# Patient Record
Sex: Male | Born: 1935 | Race: White | Hispanic: No | Marital: Married | Smoking: Never smoker
Health system: Southern US, Community
[De-identification: ages and names within clinical notes are randomized; demographics above are authoritative.]

## PROBLEM LIST (undated history)

## (undated) DIAGNOSIS — I10 Essential (primary) hypertension: Secondary | ICD-10-CM

## (undated) DIAGNOSIS — I2510 Atherosclerotic heart disease of native coronary artery without angina pectoris: Secondary | ICD-10-CM

## (undated) DIAGNOSIS — I739 Peripheral vascular disease, unspecified: Secondary | ICD-10-CM

## (undated) DIAGNOSIS — IMO0002 Reserved for concepts with insufficient information to code with codable children: Secondary | ICD-10-CM

## (undated) DIAGNOSIS — D329 Benign neoplasm of meninges, unspecified: Secondary | ICD-10-CM

## (undated) DIAGNOSIS — K5730 Diverticulosis of large intestine without perforation or abscess without bleeding: Secondary | ICD-10-CM

## (undated) DIAGNOSIS — E785 Hyperlipidemia, unspecified: Secondary | ICD-10-CM

## (undated) DIAGNOSIS — N400 Enlarged prostate without lower urinary tract symptoms: Secondary | ICD-10-CM

## (undated) DIAGNOSIS — H9110 Presbycusis, unspecified ear: Secondary | ICD-10-CM

## (undated) DIAGNOSIS — F0390 Unspecified dementia without behavioral disturbance: Secondary | ICD-10-CM

## (undated) HISTORY — DX: Benign prostatic hyperplasia without lower urinary tract symptoms: N40.0

## (undated) HISTORY — DX: Atherosclerotic heart disease of native coronary artery without angina pectoris: I25.10

## (undated) HISTORY — DX: Hyperlipidemia, unspecified: E78.5

## (undated) HISTORY — PX: BRAIN SURGERY: SHX531

## (undated) HISTORY — DX: Reserved for concepts with insufficient information to code with codable children: IMO0002

## (undated) HISTORY — DX: Essential (primary) hypertension: I10

## (undated) HISTORY — DX: Peripheral vascular disease, unspecified: I73.9

## (undated) HISTORY — DX: Diverticulosis of large intestine without perforation or abscess without bleeding: K57.30

## (undated) HISTORY — DX: Presbycusis, unspecified ear: H91.10

## (undated) HISTORY — DX: Benign neoplasm of meninges, unspecified: D32.9

---

## 1998-09-05 HISTORY — PX: GALLBLADDER SURGERY: SHX652

## 1998-09-05 HISTORY — PX: SHOULDER SURGERY: SHX246

## 2004-06-24 ENCOUNTER — Emergency Department: Payer: Self-pay | Admitting: Emergency Medicine

## 2004-06-24 ENCOUNTER — Other Ambulatory Visit: Payer: Self-pay

## 2004-07-08 ENCOUNTER — Ambulatory Visit: Payer: Self-pay | Admitting: Internal Medicine

## 2004-09-05 DIAGNOSIS — D329 Benign neoplasm of meninges, unspecified: Secondary | ICD-10-CM

## 2004-09-05 HISTORY — DX: Benign neoplasm of meninges, unspecified: D32.9

## 2004-11-08 ENCOUNTER — Emergency Department: Payer: Self-pay | Admitting: General Practice

## 2004-11-08 ENCOUNTER — Other Ambulatory Visit: Payer: Self-pay

## 2005-02-07 ENCOUNTER — Ambulatory Visit: Payer: Self-pay | Admitting: Neurosurgery

## 2005-09-05 DIAGNOSIS — K5730 Diverticulosis of large intestine without perforation or abscess without bleeding: Secondary | ICD-10-CM

## 2005-09-05 HISTORY — DX: Diverticulosis of large intestine without perforation or abscess without bleeding: K57.30

## 2005-10-25 ENCOUNTER — Ambulatory Visit: Payer: Self-pay | Admitting: Neurosurgery

## 2006-08-15 ENCOUNTER — Ambulatory Visit: Payer: Self-pay | Admitting: Gastroenterology

## 2007-06-14 ENCOUNTER — Ambulatory Visit: Payer: Self-pay

## 2007-11-19 ENCOUNTER — Other Ambulatory Visit: Payer: Self-pay

## 2007-11-19 ENCOUNTER — Emergency Department: Payer: Self-pay | Admitting: Emergency Medicine

## 2008-02-05 ENCOUNTER — Other Ambulatory Visit: Payer: Self-pay

## 2008-02-05 ENCOUNTER — Inpatient Hospital Stay: Payer: Self-pay | Admitting: Internal Medicine

## 2008-04-21 ENCOUNTER — Ambulatory Visit: Payer: Self-pay | Admitting: Internal Medicine

## 2008-05-05 ENCOUNTER — Encounter: Payer: Self-pay | Admitting: Internal Medicine

## 2008-05-06 ENCOUNTER — Encounter: Payer: Self-pay | Admitting: Internal Medicine

## 2008-06-05 ENCOUNTER — Encounter: Payer: Self-pay | Admitting: Internal Medicine

## 2008-07-06 ENCOUNTER — Encounter: Payer: Self-pay | Admitting: Internal Medicine

## 2008-07-07 ENCOUNTER — Emergency Department: Payer: Self-pay | Admitting: Emergency Medicine

## 2008-08-05 ENCOUNTER — Encounter: Payer: Self-pay | Admitting: Internal Medicine

## 2008-08-18 ENCOUNTER — Ambulatory Visit: Payer: Self-pay | Admitting: Internal Medicine

## 2008-09-22 ENCOUNTER — Ambulatory Visit: Payer: Self-pay | Admitting: Cardiovascular Disease

## 2008-09-30 ENCOUNTER — Ambulatory Visit: Payer: Self-pay | Admitting: Internal Medicine

## 2009-01-13 ENCOUNTER — Ambulatory Visit: Payer: Self-pay | Admitting: Internal Medicine

## 2010-10-06 ENCOUNTER — Ambulatory Visit: Payer: Self-pay | Admitting: Neurosurgery

## 2010-11-07 ENCOUNTER — Emergency Department: Payer: Self-pay | Admitting: Unknown Physician Specialty

## 2011-05-27 ENCOUNTER — Other Ambulatory Visit: Payer: Self-pay | Admitting: Gastroenterology

## 2011-07-05 ENCOUNTER — Encounter: Payer: Self-pay | Admitting: Internal Medicine

## 2011-07-06 ENCOUNTER — Ambulatory Visit (INDEPENDENT_AMBULATORY_CARE_PROVIDER_SITE_OTHER): Payer: 59 | Admitting: Internal Medicine

## 2011-07-06 ENCOUNTER — Encounter: Payer: Self-pay | Admitting: Internal Medicine

## 2011-07-06 DIAGNOSIS — M7520 Bicipital tendinitis, unspecified shoulder: Secondary | ICD-10-CM

## 2011-07-06 NOTE — Patient Instructions (Signed)
Take the etodolac and 2 tylenol twice daily for pain .  Add the tramadol or the tizanidine at bedtime if needed.    Stop your yardwork and physical activity until your muscle calms down   Agree with every other day miralax for bowels

## 2011-07-07 ENCOUNTER — Other Ambulatory Visit: Payer: Self-pay | Admitting: *Deleted

## 2011-07-07 MED ORDER — TIZANIDINE HCL 2 MG PO TABS: 2.0000 mg | ORAL_TABLET | Freq: Three times a day (TID) | ORAL | Status: DC

## 2011-07-07 MED ORDER — TRAMADOL-ACETAMINOPHEN 37.5-325 MG PO TABS: 1.0000 | ORAL_TABLET | Freq: Four times a day (QID) | ORAL | Status: DC | PRN

## 2011-07-07 MED ORDER — ETODOLAC 400 MG PO TABS: 400.0000 mg | ORAL_TABLET | Freq: Two times a day (BID) | ORAL | Status: DC

## 2011-07-09 ENCOUNTER — Encounter: Payer: Self-pay | Admitting: Internal Medicine

## 2011-07-09 DIAGNOSIS — I6522 Occlusion and stenosis of left carotid artery: Secondary | ICD-10-CM | POA: Insufficient documentation

## 2011-07-09 DIAGNOSIS — N400 Enlarged prostate without lower urinary tract symptoms: Secondary | ICD-10-CM | POA: Insufficient documentation

## 2011-07-09 DIAGNOSIS — M8580 Other specified disorders of bone density and structure, unspecified site: Secondary | ICD-10-CM | POA: Insufficient documentation

## 2011-07-09 DIAGNOSIS — D329 Benign neoplasm of meninges, unspecified: Secondary | ICD-10-CM | POA: Insufficient documentation

## 2011-07-09 DIAGNOSIS — M7520 Bicipital tendinitis, unspecified shoulder: Secondary | ICD-10-CM | POA: Insufficient documentation

## 2011-07-09 DIAGNOSIS — K5730 Diverticulosis of large intestine without perforation or abscess without bleeding: Secondary | ICD-10-CM | POA: Insufficient documentation

## 2011-07-09 NOTE — Progress Notes (Signed)
Subjective:    Patient ID: Derrick Sullivan, male    DOB: February 07, 1924, 77 y.o.   MRN: [id number]  HPI  Derrick Sullivan is a very active 77 yo male with a history of BPH with urinary incontinence, osteopenia, on therapy,  Benign meningioma,  Peripheral vascular disease , and history of CAD who presents with a 1 week history of right arm pain   He denies any recent falls, but he has been using the gas powered leaf blower quite often since he works in his yard daily .  His arm is sore to the touch and reports pain with use. No joint effusions, rash, decreased range of motion .  History of OA .  No fevers , dyspnea or chest pain . Past Medical History  Diagnosis Date  . CAD (coronary artery disease)   . Peripheral vascular disease   . Degenerative disk disease   . Presbyacusis   . Osteoporosis   . Duodenal ulcer 07/2004    NSAID induced  . Hyperlipidemia   . Benign prostatic hypertrophy      Current Outpatient Prescriptions on File Prior to Visit  Medication Sig Dispense Refill  . alendronate (FOSAMAX) 70 MG tablet Take 70 mg by mouth every 7 (seven) days. Take with a full glass of water on an empty stomach.       Marland Kitchen aspirin 81 MG tablet Take 81 mg by mouth daily.        Marland Kitchen atorvastatin (LIPITOR) 10 MG tablet Take 10 mg by mouth daily.        . calcium carbonate (OS-CAL) 600 MG TABS Take 600 mg by mouth 2 (two) times daily with a meal.        . ciclopirox (LOPROX) 0.77 % cream Apply topically as needed.        . clopidogrel (PLAVIX) 75 MG tablet Take 75 mg by mouth daily.        . finasteride (PROSCAR) 5 MG tablet Take 5 mg by mouth daily.        . fluocinonide (LIDEX) 0.05 % cream Apply 1 application topically as needed.        . hydrocortisone (WESTCORT) 0.2 % cream Apply 1 application topically as needed.        . Multiple Vitamin (MULTIVITAMIN) tablet Take 1 tablet by mouth daily.        . Multiple Vitamins-Minerals (PRESERVISION AREDS PO) Take by mouth.        . terazosin (HYTRIN) 2 MG  capsule Take 2 mg by mouth at bedtime.          Review of Systems  Constitutional: Negative for fever, chills, diaphoresis, activity change, appetite change, fatigue and unexpected weight change.  HENT: Negative for hearing loss, ear pain, nosebleeds, congestion, sore throat, facial swelling, rhinorrhea, sneezing, drooling, mouth sores, trouble swallowing, neck pain, neck stiffness, dental problem, voice change, postnasal drip, sinus pressure, tinnitus and ear discharge.   Eyes: Negative for photophobia, pain, discharge, redness, itching and visual disturbance.  Respiratory: Negative for apnea, cough, choking, chest tightness, shortness of breath, wheezing and stridor.   Cardiovascular: Negative for chest pain, palpitations and leg swelling.  Gastrointestinal: Negative for nausea, vomiting, abdominal pain, diarrhea, constipation, blood in stool, abdominal distention, anal bleeding and rectal pain.  Genitourinary: Negative for dysuria, urgency, frequency, hematuria, flank pain, decreased urine volume, scrotal swelling, difficulty urinating and testicular pain.  Musculoskeletal: Positive for myalgias. Negative for back pain, joint swelling, arthralgias and gait problem.  Skin: Negative for  color change, rash and wound.  Neurological: Negative for dizziness, tremors, seizures, syncope, speech difficulty, weakness, light-headedness, numbness and headaches.  Psychiatric/Behavioral: Negative for suicidal ideas, hallucinations, behavioral problems, confusion, sleep disturbance, dysphoric mood, decreased concentration and agitation. The patient is not nervous/anxious.    BP 168/73  Pulse 102  Temp(Src) 97.7 F (36.5 C) (Oral)  Resp 16  Ht 5\' 10"  (1.778 m)  Wt 172 lb 4 oz (78.132 kg)  BMI 24.72 kg/m2  SpO2 95%     Objective:   Physical Exam  Constitutional: He is oriented to person, place, and time.  HENT:  Head: Normocephalic and atraumatic.  Mouth/Throat: Oropharynx is clear and moist.    Eyes: Conjunctivae and EOM are normal.  Neck: Normal range of motion. Neck supple. No JVD present. No thyromegaly present.  Cardiovascular: Normal rate, regular rhythm and normal heart sounds.   Pulmonary/Chest: Effort normal and breath sounds normal. He has no wheezes. He has no rales.  Abdominal: Soft. Bowel sounds are normal. He exhibits no mass. There is no tenderness. There is no rebound.  Musculoskeletal: Normal range of motion. He exhibits tenderness. He exhibits no edema.       Right elbow: tenderness found.       Arms: Neurological: He is alert and oriented to person, place, and time.  Skin: Skin is warm and dry.  Psychiatric: He has a normal mood and affect.       Assessment & Plan:  Biceps tendonitis/strain:  Secondary to use of leaf blower .  Recommended ice, analgesics and rest .  If no improvement in 2 weeks, return.  Elevated blood pressure:  His bp in July and Jan were normal by revieow of prior records.  He will check his bp at home over the next two weeks and call if elevated.

## 2011-07-13 ENCOUNTER — Other Ambulatory Visit: Payer: Self-pay | Admitting: Internal Medicine

## 2011-07-13 MED ORDER — CLOPIDOGREL BISULFATE 75 MG PO TABS: 75.0000 mg | ORAL_TABLET | Freq: Every day | ORAL | Status: DC

## 2011-07-13 NOTE — Telephone Encounter (Signed)
90 day supply with 3 refills

## 2011-07-19 MED ORDER — TERAZOSIN HCL 2 MG PO CAPS: 2.0000 mg | ORAL_CAPSULE | Freq: Every day | ORAL | Status: DC

## 2011-07-19 MED ORDER — ALENDRONATE SODIUM 70 MG PO TABS: 70.0000 mg | ORAL_TABLET | ORAL | Status: DC

## 2011-07-19 NOTE — Telephone Encounter (Signed)
please make sure the carvedilol was refilled for #90 with 3 refills.

## 2011-07-19 NOTE — Telephone Encounter (Signed)
The meds that Derrick Sullivan listed below were the correct meds that patient was requesting.

## 2011-07-19 NOTE — Telephone Encounter (Signed)
Please confirm which medications Mr. Einstein was referrign to.  Erie Noe did not specify and I do not see carvedilol on his list of meds to refill!

## 2011-08-08 ENCOUNTER — Ambulatory Visit: Payer: 59 | Admitting: Internal Medicine

## 2011-08-23 ENCOUNTER — Ambulatory Visit (INDEPENDENT_AMBULATORY_CARE_PROVIDER_SITE_OTHER): Payer: 59 | Admitting: Internal Medicine

## 2011-08-23 ENCOUNTER — Encounter: Payer: Self-pay | Admitting: Internal Medicine

## 2011-08-23 DIAGNOSIS — Z79899 Other long term (current) drug therapy: Secondary | ICD-10-CM

## 2011-08-23 DIAGNOSIS — F09 Unspecified mental disorder due to known physiological condition: Secondary | ICD-10-CM

## 2011-08-23 DIAGNOSIS — R4189 Other symptoms and signs involving cognitive functions and awareness: Secondary | ICD-10-CM

## 2011-08-23 DIAGNOSIS — G309 Alzheimer's disease, unspecified: Secondary | ICD-10-CM | POA: Insufficient documentation

## 2011-08-23 DIAGNOSIS — M7520 Bicipital tendinitis, unspecified shoulder: Secondary | ICD-10-CM

## 2011-08-23 DIAGNOSIS — R413 Other amnesia: Secondary | ICD-10-CM

## 2011-08-23 DIAGNOSIS — K5730 Diverticulosis of large intestine without perforation or abscess without bleeding: Secondary | ICD-10-CM

## 2011-08-23 DIAGNOSIS — I10 Essential (primary) hypertension: Secondary | ICD-10-CM

## 2011-08-23 DIAGNOSIS — I6529 Occlusion and stenosis of unspecified carotid artery: Secondary | ICD-10-CM

## 2011-08-23 NOTE — Assessment & Plan Note (Signed)
His symptoms have improved with alteration of activity and NSAID use

## 2011-08-23 NOTE — Assessment & Plan Note (Signed)
He was referred to Dr. Baldomero Lamy for recurrent symptoms of constipation alternating with loose stool and after stool analyses was not evaluated with colonoscopy due to his age.

## 2011-08-23 NOTE — Assessment & Plan Note (Signed)
Reassurance provided,   He will return for serologic workup and a MMSE in one month

## 2011-08-23 NOTE — Progress Notes (Signed)
  Subjective:    Patient ID: Derrick Sullivan, male    DOB: 1924/06/12, 77 y.o.   MRN: [id number]  HPI  Derrick Sullivan is an 77 yr old white male with a history of CAD, PAD, BPH , meningioma, who presents for 3 month followp on chronic issues.   He feels generally well but is not his usual light hearted self.  He denies headaches, myalgias, URI symptoms, but reports frustration and aggravation over recent lapses in short term memory.  His blood pressure is elevated today, the second time , with no prior histoyr of HTN,.  He does not check it at home and has no prior diagnosis of hypertension.    Review of Systems     Objective:   Physical Exam        Assessment & Plan:

## 2011-08-23 NOTE — Patient Instructions (Addendum)
Get your blood pressure  checked along   with your pulse 4 or 5 times over the next month and return with readings in  1  Month.

## 2011-08-23 NOTE — Assessment & Plan Note (Signed)
He continues to taek a statin and an aspirin foe CAD and PAD.  Repeat lipids and CMET are due/

## 2011-08-24 LAB — BASIC METABOLIC PANEL
BUN: 27 mg/dL — ABNORMAL HIGH (ref 6–23)
CO2: 26 mEq/L (ref 19–32)
Chloride: 108 mEq/L (ref 96–112)
Creatinine, Ser: 0.8 mg/dL (ref 0.4–1.5)
Glucose, Bld: 150 mg/dL — ABNORMAL HIGH (ref 70–99)

## 2011-09-01 ENCOUNTER — Encounter: Payer: Self-pay | Admitting: Internal Medicine

## 2011-09-22 ENCOUNTER — Other Ambulatory Visit (INDEPENDENT_AMBULATORY_CARE_PROVIDER_SITE_OTHER): Payer: 59 | Admitting: *Deleted

## 2011-09-22 ENCOUNTER — Other Ambulatory Visit: Payer: Self-pay | Admitting: *Deleted

## 2011-09-22 DIAGNOSIS — Z0000 Encounter for general adult medical examination without abnormal findings: Secondary | ICD-10-CM

## 2011-09-22 DIAGNOSIS — E785 Hyperlipidemia, unspecified: Secondary | ICD-10-CM

## 2011-09-22 LAB — BASIC METABOLIC PANEL
BUN: 22 mg/dL (ref 6–23)
Chloride: 106 mEq/L (ref 96–112)
Glucose, Bld: 120 mg/dL — ABNORMAL HIGH (ref 70–99)
Potassium: 3.9 mEq/L (ref 3.5–5.1)

## 2011-09-22 LAB — LIPID PANEL
LDL Cholesterol: 60 mg/dL (ref 0–99)
VLDL: 7.2 mg/dL (ref 0.0–40.0)

## 2011-09-22 LAB — HEMOGLOBIN A1C: Hgb A1c MFr Bld: 6.2 % (ref 4.6–6.5)

## 2011-09-22 MED ORDER — TIZANIDINE HCL 2 MG PO TABS: 2.0000 mg | ORAL_TABLET | Freq: Three times a day (TID) | ORAL | Status: DC

## 2011-09-22 NOTE — Telephone Encounter (Signed)
Faxed request from [facility], last filled 08/29/11.

## 2011-09-23 ENCOUNTER — Other Ambulatory Visit: Payer: 59

## 2011-09-26 ENCOUNTER — Encounter: Payer: Self-pay | Admitting: Internal Medicine

## 2011-09-26 ENCOUNTER — Ambulatory Visit (INDEPENDENT_AMBULATORY_CARE_PROVIDER_SITE_OTHER): Payer: 59 | Admitting: Internal Medicine

## 2011-09-26 DIAGNOSIS — I10 Essential (primary) hypertension: Secondary | ICD-10-CM

## 2011-09-26 DIAGNOSIS — K269 Duodenal ulcer, unspecified as acute or chronic, without hemorrhage or perforation: Secondary | ICD-10-CM | POA: Insufficient documentation

## 2011-09-26 DIAGNOSIS — T39395A Adverse effect of other nonsteroidal anti-inflammatory drugs [NSAID], initial encounter: Secondary | ICD-10-CM

## 2011-09-26 DIAGNOSIS — E1159 Type 2 diabetes mellitus with other circulatory complications: Secondary | ICD-10-CM

## 2011-09-26 DIAGNOSIS — M899 Disorder of bone, unspecified: Secondary | ICD-10-CM

## 2011-09-26 DIAGNOSIS — R413 Other amnesia: Secondary | ICD-10-CM

## 2011-09-26 DIAGNOSIS — I6529 Occlusion and stenosis of unspecified carotid artery: Secondary | ICD-10-CM

## 2011-09-26 DIAGNOSIS — N400 Enlarged prostate without lower urinary tract symptoms: Secondary | ICD-10-CM

## 2011-09-26 DIAGNOSIS — M8580 Other specified disorders of bone density and structure, unspecified site: Secondary | ICD-10-CM

## 2011-09-26 DIAGNOSIS — E1151 Type 2 diabetes mellitus with diabetic peripheral angiopathy without gangrene: Secondary | ICD-10-CM | POA: Insufficient documentation

## 2011-09-26 DIAGNOSIS — I739 Peripheral vascular disease, unspecified: Secondary | ICD-10-CM

## 2011-09-26 HISTORY — DX: Essential (primary) hypertension: I10

## 2011-09-26 NOTE — Progress Notes (Signed)
Subjective:    Patient ID: Derrick Sullivan, male    DOB: 04-12-1924, 78 y.o.   MRN: [id number]  HPI Mr., Derrick Sullivan is a very pleasant elderaly male wh is here to followup on hypertension, hyperlipidemia and diabetes.  His chief complaint sis overmedication and joint pain.  [place].  He takes Lodine daily but it does not help his pain is much as the tramadol does.  He has been chekcing his blood pressure several times weekly since his last visit and his readings vary from 130 to 160,  mosre recently in the 130's.  Past Medical History  Diagnosis Date  . CAD (coronary artery disease)   . Peripheral vascular disease   . Degenerative disk disease   . Presbyacusis   . Osteoporosis   . Duodenal ulcer 07/2004    NSAID induced  . Hyperlipidemia   . Benign prostatic hypertrophy   . Benign meningioma 2006    s/p parietal resection  . Osteopenia   . Carotid artery stenosis, asymptomatic 2008    hemodynamically insignificant  . Diverticulosis of colon 2007     Current Outpatient Prescriptions on File Prior to Visit  Medication Sig Dispense Refill  . alendronate (FOSAMAX) 70 MG tablet Take 1 tablet (70 mg total) by mouth every 7 (seven) days. Take with a full glass of water on an empty stomach.  21 tablet  3  . aspirin 81 MG tablet Take 81 mg by mouth daily.        Marland Kitchen atorvastatin (LIPITOR) 10 MG tablet Take 10 mg by mouth daily.        . calcium carbonate (OS-CAL) 600 MG TABS Take 600 mg by mouth 2 (two) times daily with a meal.        . ciclopirox (LOPROX) 0.77 % cream Apply topically as needed.        . clopidogrel (PLAVIX) 75 MG tablet Take 1 tablet (75 mg total) by mouth daily.  30 tablet  3  . etodolac (LODINE) 400 MG tablet Take 1 tablet (400 mg total) by mouth 2 (two) times daily.  60 tablet  1  . finasteride (PROSCAR) 5 MG tablet Take 5 mg by mouth daily.        . fluocinonide (LIDEX) 0.05 % cream Apply 1 application topically as needed.        .  hydrocortisone (WESTCORT) 0.2 % cream Apply 1 application topically as needed.        . Multiple Vitamin (MULTIVITAMIN) tablet Take 1 tablet by mouth daily.        . Multiple Vitamins-Minerals (PRESERVISION AREDS PO) Take by mouth.        . pantoprazole (PROTONIX) 40 MG tablet Take 40 mg by mouth daily.        Marland Kitchen terazosin (HYTRIN) 2 MG capsule Take 1 capsule (2 mg total) by mouth at bedtime.  90 capsule  3  . tiZANidine (ZANAFLEX) 2 MG tablet Take 1 tablet (2 mg total) by mouth 3 (three) times daily.  30 tablet  2  . traMADol-acetaminophen (ULTRACET) 37.5-325 MG per tablet Take 1 tablet by mouth 4 (four) times daily as needed.  60 tablet  1    Review of Systems  Constitutional: Negative for fever, chills, diaphoresis, activity change, appetite change, fatigue and unexpected weight change.  HENT: Negative for hearing loss, ear pain, nosebleeds, congestion, sore throat, facial swelling, rhinorrhea, sneezing, drooling, mouth sores, trouble swallowing, neck pain, neck stiffness, dental  problem, voice change, postnasal drip, sinus pressure, tinnitus and ear discharge.   Eyes: Negative for photophobia, pain, discharge, redness, itching and visual disturbance.  Respiratory: Negative for apnea, cough, choking, chest tightness, shortness of breath, wheezing and stridor.   Cardiovascular: Negative for chest pain, palpitations and leg swelling.  Gastrointestinal: Negative for nausea, vomiting, abdominal pain, diarrhea, constipation, blood in stool, abdominal distention, anal bleeding and rectal pain.  Genitourinary: Negative for dysuria, urgency, frequency, hematuria, flank pain, decreased urine volume, scrotal swelling, difficulty urinating and testicular pain.  Musculoskeletal: Negative for myalgias, back pain, joint swelling, arthralgias and gait problem.  Skin: Negative for color change, rash and wound.  Neurological: Negative for dizziness, tremors, seizures, syncope, speech difficulty, weakness,  light-headedness, numbness and headaches.  Psychiatric/Behavioral: Negative for suicidal ideas, hallucinations, behavioral problems, confusion, sleep disturbance, dysphoric mood, decreased concentration and agitation. The patient is not nervous/anxious.    BP 142/68  Pulse 67  Temp(Src) 97.7 F (36.5 C) (Oral)  Wt 167 lb (75.751 kg)  SpO2 97%     Objective:   Physical Exam  Constitutional: He is oriented to person, place, and time.  HENT:  Head: Normocephalic and atraumatic.  Mouth/Throat: Oropharynx is clear and moist.  Eyes: Conjunctivae and EOM are normal.  Neck: Normal range of motion. Neck supple. No JVD present. No thyromegaly present.  Cardiovascular: Normal rate, regular rhythm and normal heart sounds.   Pulmonary/Chest: Effort normal and breath sounds normal. He has no wheezes. He has no rales.  Abdominal: Soft. Bowel sounds are normal. He exhibits no mass. There is no tenderness. There is no rebound.  Musculoskeletal: Normal range of motion. He exhibits no edema.  Neurological: He is alert and oriented to person, place, and time.  Skin: Skin is warm and dry.  Psychiatric: He has a normal mood and affect.       Assessment & Plan:   Benign prostatic hypertrophy Continue hytrin and finasteride for BPH.  Symptoms currently controlled.   Carotid artery stenosis, asymptomatic Continue asa, plavix and statin .  LDL is at goal of 72  Osteopenia Continue weekly fosamax, but stopping calcium since he has a well balanced diet and there is recent data not supporting the use of calcium to prevent fractures.  Short-term memory loss Mild. No progression noted.   Duodenal ulcer from aspirin/ibuprofen-like drugs (NSAID's) He has been taking a PPI for over one year due to  history of ulcer and should not be taking lodine.  Medication reviewed and lodine stopped.  insturcted to use tramadol for pain .  PPI also stopped since has no GI symptoms currently  Diabetes mellitus with  peripheral artery disease Well controlled with diet alone,  hgba1c 6.2  .  Contineu statin for LDL of 72 and plavix/asa for carotid artery stenosis.  Hypertension improved control currently,  Should improve more with stopping Lodine.  No changes today.  Renal function stable.     Updated Medication List Outpatient Encounter Prescriptions as of 09/26/2011  Medication Sig Dispense Refill  . alendronate (FOSAMAX) 70 MG tablet Take 1 tablet (70 mg total) by mouth every 7 (seven) days. Take with a full glass of water on an empty stomach.  21 tablet  3  . aspirin 81 MG tablet Take 81 mg by mouth daily.        Marland Kitchen atorvastatin (LIPITOR) 10 MG tablet Take 10 mg by mouth daily.        . ciclopirox (LOPROX) 0.77 % cream Apply topically as needed.        Marland Kitchen  clopidogrel (PLAVIX) 75 MG tablet Take 1 tablet (75 mg total) by mouth daily.  30 tablet  3  . finasteride (PROSCAR) 5 MG tablet Take 5 mg by mouth daily.        . fluocinonide (LIDEX) 0.05 % cream Apply 1 application topically as needed.        . hydrocortisone (WESTCORT) 0.2 % cream Apply 1 application topically as needed.        . Multiple Vitamins-Minerals (PRESERVISION AREDS PO) Take by mouth.        . pantoprazole (PROTONIX) 40 MG tablet Take 40 mg by mouth daily.        . traMADol-acetaminophen (ULTRACET) 37.5-325 MG per tablet Take 1 tablet by mouth 4 (four) times daily as needed.  60 tablet  1  . DISCONTD: calcium carbonate (OS-CAL) 600 MG TABS Take 600 mg by mouth 2 (two) times daily with a meal.        . DISCONTD: etodolac (LODINE) 400 MG tablet Take 1 tablet (400 mg total) by mouth 2 (two) times daily.  60 tablet  1  . DISCONTD: Multiple Vitamin (MULTIVITAMIN) tablet Take 1 tablet by mouth daily.        Marland Kitchen DISCONTD: terazosin (HYTRIN) 2 MG capsule Take 1 capsule (2 mg total) by mouth at bedtime.  90 capsule  3  . DISCONTD: tiZANidine (ZANAFLEX) 2 MG tablet Take 1 tablet (2 mg total) by mouth 3 (three) times daily.  30 tablet  2

## 2011-09-26 NOTE — Assessment & Plan Note (Signed)
Well controlled with diet alone,  hgba1c 6.2  .  Contineu statin for LDL of 72 and plavix/asa for carotid artery stenosis.

## 2011-09-26 NOTE — Assessment & Plan Note (Signed)
Continue hytrin and finasteride for BPH.  Symptoms currently controlled.

## 2011-09-26 NOTE — Assessment & Plan Note (Signed)
Continue weekly fosamax, but stopping calcium since he has a well balanced diet and there is recent data not supporting the use of calcium to prevent fractures.

## 2011-09-26 NOTE — Assessment & Plan Note (Signed)
improved control currently,  Should improve more with stopping Lodine.  No changes today.  Renal function stable.

## 2011-09-26 NOTE — Assessment & Plan Note (Addendum)
He has been taking a PPI for over one year due to  history of ulcer and should not be taking lodine.  Medication reviewed and lodine stopped.  insturcted to use tramadol for pain .  PPI also stopped since has no GI symptoms currently

## 2011-09-26 NOTE — Patient Instructions (Addendum)
You can stop the following medications and it won't affect your heart or blood pressure:  Tizanidine  (muslce relaxer,  This is for muscle spasms)  Calcium One a Day Vitamin  (but continue the AREDS  PRESERVISION) Lodine  (anti inflammatory, for arthritis) pantoprazole (it is a medicine to treat reflux and ulcers)   The rest of your medications are necessary for  your heart, brain, prosatate and bones  A good goal for your blood pressure is 130 to  140 / 70 to 80

## 2011-09-26 NOTE — Assessment & Plan Note (Signed)
Continue asa, plavix and statin .  LDL is at goal of 72

## 2011-09-26 NOTE — Assessment & Plan Note (Signed)
Mild. No progression noted.

## 2011-10-27 ENCOUNTER — Telehealth: Payer: Self-pay | Admitting: Internal Medicine

## 2011-10-27 ENCOUNTER — Other Ambulatory Visit: Payer: Self-pay | Admitting: *Deleted

## 2011-10-27 MED ORDER — CLOPIDOGREL BISULFATE 75 MG PO TABS: 75.0000 mg | ORAL_TABLET | Freq: Every day | ORAL | Status: DC

## 2011-10-27 NOTE — Telephone Encounter (Signed)
Pt called his drug store can not get through for the past 3 days He needs to get a refill  hytrin  Pt would like to 90 day supply [facility] Please advise pt when this is called [phone]  Pt is almost  Out  Of meds pt would like to get rx today

## 2011-10-27 NOTE — Telephone Encounter (Signed)
Correction  See below note  Pt called  Back he gave the wrong rx he needs plavix He would like to get a 90 supply with 3 refills Pt only has 2 left [facility]  He does not need hytrin

## 2011-10-27 NOTE — Telephone Encounter (Signed)
Medicine has been sent to the pharmacy, pt notified.

## 2011-11-16 ENCOUNTER — Ambulatory Visit: Payer: 59 | Admitting: Internal Medicine

## 2011-11-18 ENCOUNTER — Ambulatory Visit (INDEPENDENT_AMBULATORY_CARE_PROVIDER_SITE_OTHER): Payer: 59 | Admitting: Internal Medicine

## 2011-11-18 ENCOUNTER — Encounter: Payer: Self-pay | Admitting: Internal Medicine

## 2011-11-18 VITALS — BP 132/60 | HR 76 | Temp 98.1°F | Resp 16 | Wt 163.2 lb

## 2011-11-18 DIAGNOSIS — I10 Essential (primary) hypertension: Secondary | ICD-10-CM

## 2011-11-18 DIAGNOSIS — I739 Peripheral vascular disease, unspecified: Secondary | ICD-10-CM

## 2011-11-18 DIAGNOSIS — E1159 Type 2 diabetes mellitus with other circulatory complications: Secondary | ICD-10-CM

## 2011-11-18 DIAGNOSIS — E1151 Type 2 diabetes mellitus with diabetic peripheral angiopathy without gangrene: Secondary | ICD-10-CM

## 2011-11-18 DIAGNOSIS — R071 Chest pain on breathing: Secondary | ICD-10-CM

## 2011-11-18 DIAGNOSIS — I798 Other disorders of arteries, arterioles and capillaries in diseases classified elsewhere: Secondary | ICD-10-CM

## 2011-11-18 DIAGNOSIS — R21 Rash and other nonspecific skin eruption: Secondary | ICD-10-CM | POA: Insufficient documentation

## 2011-11-18 DIAGNOSIS — R0789 Other chest pain: Secondary | ICD-10-CM | POA: Insufficient documentation

## 2011-11-18 DIAGNOSIS — R413 Other amnesia: Secondary | ICD-10-CM

## 2011-11-18 MED ORDER — TRIAMCINOLONE ACETONIDE 0.1 % EX CREA: TOPICAL_CREAM | Freq: Two times a day (BID) | CUTANEOUS | Status: AC

## 2011-11-18 MED ORDER — TRAMADOL HCL 50 MG PO TABS
50.0000 mg | ORAL_TABLET | Freq: Four times a day (QID) | ORAL | Status: AC | PRN
Start: 2011-11-18 — End: 2011-11-28

## 2011-11-18 MED ORDER — DICLOFENAC SODIUM 75 MG PO TBEC: 75.0000 mg | DELAYED_RELEASE_TABLET | Freq: Two times a day (BID) | ORAL | Status: DC

## 2011-11-18 NOTE — Progress Notes (Deleted)
  Subjective:    Patient ID: Derrick Sullivan, male    DOB: [birth year], 78 y.o.   MRN: [id number]  HPI  Presents with  one week history  left sided waist and chest wall pain, aggravated  by sudden movements, better now.  Has been using prn tramadol with some relief      Review of Systems     Objective:   Physical Exam        Assessment & Plan:

## 2011-11-18 NOTE — Patient Instructions (Signed)
I am sending you to [facility] for some x rays of your chest  Xray and ribs to rule out a fracture.  You can increase your tramadol to every 6 hours,  Along with one tablet twice dialy of the new pill I am prescribing, called diclofenac

## 2011-11-20 ENCOUNTER — Encounter: Payer: Self-pay | Admitting: Internal Medicine

## 2011-11-20 NOTE — Assessment & Plan Note (Signed)
His exam today is unrevealing for bruising, lesions suggestive of  shingles , and muscle spasm. I have ordered a PA and lateral chest x-ray along with unilateral rib films for further evaluation.

## 2011-11-20 NOTE — Progress Notes (Signed)
Patient ID: Derrick Sullivan, male   DOB: 09/29/1923, 78 y.o.   MRN: [id number]  Patient Active Problem List  Diagnoses  . Benign prostatic hypertrophy  . Benign meningioma  . Osteopenia  . Carotid artery stenosis, asymptomatic  . Diverticulosis of colon  . Biceps tendonitis  . Short-term memory loss  . Duodenal ulcer from aspirin/ibuprofen-like drugs (NSAID's)  . Hypertension  . Diabetes mellitus with peripheral artery disease  . Left-sided chest wall pain  . Rash    Subjective:  CC:   Chief Complaint  Patient presents with  . Muscle Pain    HPI:   Derrick Sullivan a 78 y.o. male who presents with a one-week history of left lateral chest wall pain which is aggravated by certain movements and deep breathing. He has no recent history of trauma and denies any recent abnormal activity which may have strained something. He denies cough and shortness of breath. He denies fevers abdominal pain but does note some decreased memory which was discussed at last visit and according to his wife he is progressing. He states he is having some trouble finding his destination when he gets in the car. He has no history of anorexia or sleep disruption. Bowels are moving normally.   The following portions of the patient's history were reviewed and updated as appropriate: Allergies, current medications, and problem list.  Past Medical History  Diagnosis Date  . CAD (coronary artery disease)   . Peripheral vascular disease   . Degenerative disk disease   . Presbyacusis   . Osteoporosis   . Duodenal ulcer 07/2004    NSAID induced  . Hyperlipidemia   . Benign prostatic hypertrophy   . Benign meningioma 2006    s/p parietal resection  . Osteopenia   . Carotid artery stenosis, asymptomatic 2008    hemodynamically insignificant  . Diverticulosis of colon 2007  . Hypertension 09/26/2011    Past Surgical History  Procedure Date  . Shoulder surgery 2000    rotator cuff  . Gallbladder  surgery 2000    Current Outpatient Prescriptions on File Prior to Visit  Medication Sig Dispense Refill  . alendronate (FOSAMAX) 70 MG tablet Take 1 tablet (70 mg total) by mouth every 7 (seven) days. Take with a full glass of water on an empty stomach.  21 tablet  3  . aspirin 81 MG tablet Take 81 mg by mouth daily.        Marland Kitchen atorvastatin (LIPITOR) 10 MG tablet Take 10 mg by mouth daily.        . ciclopirox (LOPROX) 0.77 % cream Apply topically as needed.        . clopidogrel (PLAVIX) 75 MG tablet Take 1 tablet (75 mg total) by mouth daily.  90 tablet  3  . finasteride (PROSCAR) 5 MG tablet Take 5 mg by mouth daily.        . fluocinonide (LIDEX) 0.05 % cream Apply 1 application topically as needed.        . hydrocortisone (WESTCORT) 0.2 % cream Apply 1 application topically as needed.        . Multiple Vitamins-Minerals (PRESERVISION AREDS PO) Take by mouth.        . traMADol-acetaminophen (ULTRACET) 37.5-325 MG per tablet Take 1 tablet by mouth 4 (four) times daily as needed.  60 tablet  1     Review of Systems:  12 point review of systems is negative except what is addressed in the HPI,   Objective:  BP 132/60  Pulse 76  Temp(Src) 98.1 F (36.7 C) (Oral)  Resp 16  Wt 163 lb 4 oz (74.05 kg)  SpO2 98%  General Appearance:    Alert, cooperative, no distress, appears stated age  Head:    Normocephalic, without obvious abnormality, atraumatic  Eyes:    PERRL, conjunctiva/corneas clear, EOM's intact, both eyes       Ears:    Normal TM's and external ear canals, both ears  Nose:   Nares normal, septum midline, mucosa normal, no drainage   or sinus tenderness  Throat:   Lips, mucosa, and tongue normal; teeth and gums normal  Neck:   Supple, symmetrical, trachea midline, no adenopathy;       thyroid:  No enlargement/tenderness/nodules; no carotid   bruit or JVD  Back:     Symmetric, no curvature, ROM normal, no CVA tenderness  Lungs:     Clear to auscultation bilaterally,  respirations unlabored  Chest wall:    No tenderness or deformity  Heart:    Regular rate and rhythm, S1 and S2 normal, no murmur, rub   or gallop  Abdomen:     Soft, non-tender, bowel sounds active all four quadrants,    no masses, no organomegaly  Extremities:   Extremities normal, atraumatic, no cyanosis or edema  Pulses:   2+ and symmetric all extremities  Skin:   Skin color, texture, turgor normal, no rashes or lesions  Lymph nodes:   Cervical, supraclavicular, and axillary nodes normal  Neurologic:   CNII-XII intact. Normal strength, sensation and reflexes      throughout   Assessment and Plan:    Short-term memory loss His prior serologic workup at last visit was nonrevealing. His wife does think is his memory loss has been progressing. He has had some trouble in navigating through down to his destination. He will need an MRI to finish the workup. However, since she receives annual MRIs to followup on his meningioma he may want to try and appeared trial of Aricept. will discuss at next visit.  Left-sided chest wall pain His exam today is unrevealing for bruising, lesions suggestive of  shingles , and muscle spasm. I have ordered a PA and lateral chest x-ray along with unilateral rib films for further evaluation.  Hypertension Well controlled on current regimen. Renal function stable, no changes today.  Diabetes mellitus with peripheral artery disease Well controlled on current regimen, HgbA1c < 7.0.  No changes  Today.    Updated Medication List Outpatient Encounter Prescriptions as of 11/18/2011  Medication Sig Dispense Refill  . alendronate (FOSAMAX) 70 MG tablet Take 1 tablet (70 mg total) by mouth every 7 (seven) days. Take with a full glass of water on an empty stomach.  21 tablet  3  . aspirin 81 MG tablet Take 81 mg by mouth daily.        Marland Kitchen atorvastatin (LIPITOR) 10 MG tablet Take 10 mg by mouth daily.        . ciclopirox (LOPROX) 0.77 % cream Apply topically as  needed.        . clopidogrel (PLAVIX) 75 MG tablet Take 1 tablet (75 mg total) by mouth daily.  90 tablet  3  . finasteride (PROSCAR) 5 MG tablet Take 5 mg by mouth daily.        . fluocinonide (LIDEX) 0.05 % cream Apply 1 application topically as needed.        . hydrocortisone (WESTCORT) 0.2 % cream Apply 1 application  topically as needed.        . Multiple Vitamins-Minerals (PRESERVISION AREDS PO) Take by mouth.        . terazosin (HYTRIN) 2 MG capsule Take 2 mg by mouth at bedtime.      . traMADol-acetaminophen (ULTRACET) 37.5-325 MG per tablet Take 1 tablet by mouth 4 (four) times daily as needed.  60 tablet  1  . diclofenac (VOLTAREN) 75 MG EC tablet Take 1 tablet (75 mg total) by mouth 2 (two) times daily.  30 tablet  0  . traMADol (ULTRAM) 50 MG tablet Take 1 tablet (50 mg total) by mouth every 6 (six) hours as needed for pain.  120 tablet  3  . triamcinolone cream (KENALOG) 0.1 % Apply topically 2 (two) times daily.  30 g  0  . DISCONTD: pantoprazole (PROTONIX) 40 MG tablet Take 40 mg by mouth daily.          Orders Placed This Encounter  Procedures  . DG Chest 2 View  . DG Ribs Unilateral Left    Return in about 1 week (around 11/25/2011).

## 2011-11-20 NOTE — Assessment & Plan Note (Signed)
Well controlled on current regimen. Renal function stable, no changes today. 

## 2011-11-20 NOTE — Assessment & Plan Note (Signed)
His prior serologic workup at last visit was nonrevealing. His wife does think is his memory loss has been progressing. He has had some trouble in navigating through down to his destination. He will need an MRI to finish the workup. However, since she receives annual MRIs to followup on his meningioma he may want to try and appeared trial of Aricept. will discuss at next visit.

## 2011-11-21 ENCOUNTER — Ambulatory Visit: Payer: Self-pay | Admitting: Internal Medicine

## 2011-11-25 ENCOUNTER — Encounter: Payer: Self-pay | Admitting: Internal Medicine

## 2011-11-25 ENCOUNTER — Ambulatory Visit (INDEPENDENT_AMBULATORY_CARE_PROVIDER_SITE_OTHER): Payer: 59 | Admitting: Internal Medicine

## 2011-11-25 VITALS — BP 116/50 | HR 78 | Temp 97.7°F | Resp 16 | Wt 164.0 lb

## 2011-11-25 DIAGNOSIS — R071 Chest pain on breathing: Secondary | ICD-10-CM

## 2011-11-25 DIAGNOSIS — G5790 Unspecified mononeuropathy of unspecified lower limb: Secondary | ICD-10-CM

## 2011-11-25 DIAGNOSIS — I10 Essential (primary) hypertension: Secondary | ICD-10-CM

## 2011-11-25 DIAGNOSIS — R0789 Other chest pain: Secondary | ICD-10-CM

## 2011-11-25 DIAGNOSIS — R413 Other amnesia: Secondary | ICD-10-CM

## 2011-11-25 NOTE — Progress Notes (Signed)
Patient ID: Derrick Sullivan, male   DOB: 06-Apr-1924, 78 y.o.   MRN: [id number]     Patient Active Problem List  Diagnoses  . Benign prostatic hypertrophy  . Benign meningioma  . Osteopenia  . Carotid artery stenosis, asymptomatic  . Diverticulosis of colon  . Biceps tendonitis  . Short-term memory loss  . Duodenal ulcer from aspirin/ibuprofen-like drugs (NSAID's)  . Hypertension  . Diabetes mellitus with peripheral artery disease  . Left-sided chest wall pain  . Rash  . Neuropathy of foot    Subjective:  CC:   Chief Complaint  Patient presents with  . Follow-up    one week    HPI:   Derrick Sullivan a 78 y.o. male who presents For followup on chest pain that he described last week involving the left lateral chest that have been present for over a one-week period he was sent for PA and lateral chest x-ray NU a unilateral rib films to rule out fracture given his history of osteoporosis his chest x-rays were normal his pain is largely resolved.   He is using  tramadol as needed, never more than twice daily.Marland Kitchen  He never picked up the diclofenac that's called in for anti-inflammatory.  He is having some difficulty sorting out his medications and requires repeated instructions.  the second complaint is a cold left foot. He was told several years ago when he had his meningioma resected as you have circulation problems in that foot. He is concerned that he is not getting good blood flow to his toes and his foot remains cold all the time. He denies pain or any claudication symptoms. The foot it does not turn red in the joints do not swell .     The following portions of the patient's history were reviewed and updated as appropriate: Allergies, current medications, and problem list.  Past Medical History  Diagnosis Date  . CAD (coronary artery disease)   . Peripheral vascular disease   . Degenerative disk disease   . Presbyacusis   . Osteoporosis   . Duodenal ulcer 07/2004   NSAID induced  . Hyperlipidemia   . Benign prostatic hypertrophy   . Benign meningioma 2006    s/p parietal resection  . Osteopenia   . Carotid artery stenosis, asymptomatic 2008    hemodynamically insignificant  . Diverticulosis of colon 2007  . Hypertension 09/26/2011    Past Surgical History  Procedure Date  . Shoulder surgery 2000    rotator cuff  . Gallbladder surgery 2000    Current Outpatient Prescriptions on File Prior to Visit  Medication Sig Dispense Refill  . alendronate (FOSAMAX) 70 MG tablet Take 1 tablet (70 mg total) by mouth every 7 (seven) days. Take with a full glass of water on an empty stomach.  21 tablet  3  . aspirin 81 MG tablet Take 81 mg by mouth daily.        Marland Kitchen atorvastatin (LIPITOR) 10 MG tablet Take 10 mg by mouth daily.        . ciclopirox (LOPROX) 0.77 % cream Apply topically as needed.        . clopidogrel (PLAVIX) 75 MG tablet Take 1 tablet (75 mg total) by mouth daily.  90 tablet  3  . finasteride (PROSCAR) 5 MG tablet Take 5 mg by mouth daily.        . fluocinonide (LIDEX) 0.05 % cream Apply 1 application topically as needed.        Marland Kitchen  hydrocortisone (WESTCORT) 0.2 % cream Apply 1 application topically as needed.        . Multiple Vitamins-Minerals (PRESERVISION AREDS PO) Take by mouth.        . terazosin (HYTRIN) 2 MG capsule Take 2 mg by mouth at bedtime.      . traMADol (ULTRAM) 50 MG tablet Take 1 tablet (50 mg total) by mouth every 6 (six) hours as needed for pain.  120 tablet  3  . triamcinolone cream (KENALOG) 0.1 % Apply topically 2 (two) times daily.  30 g  0     Review of Systems:  12 point review of systems is negative except what is addressed in the HPI,       Objective:  BP 116/50  Pulse 78  Temp(Src) 97.7 F (36.5 C) (Oral)  Resp 16  Wt 164 lb (74.39 kg)  SpO2 98%  General:   BP 116/50  Pulse 78  Temp(Src) 97.7 F (36.5 C) (Oral)  Resp 16  Wt 164 lb (74.39 kg)  SpO2 98%  General Appearance:    Alert,  cooperative, no distress, appears stated age  Head:    Normocephalic, without obvious abnormality, atraumatic  Eyes:    PERRL, conjunctiva/corneas clear, EOM's intact, both eyes       Ears:    Normal TM's and external ear canals, both ears  Nose:   Nares normal, septum midline, mucosa normal, no drainage   or sinus tenderness  Throat:   Lips, mucosa, and tongue normal; teeth and gums normal  Neck:   Supple, symmetrical, trachea midline, no adenopathy;       thyroid:  No enlargement/tenderness/nodules; no carotid   bruit or JVD  Back:     Symmetric, no curvature, ROM normal, no CVA tenderness  Lungs:     Clear to auscultation bilaterally, respirations unlabored  Chest wall:    No tenderness or deformity  Heart:    Regular rate and rhythm, S1 and S2 normal, no murmur, rub   or gallop  Abdomen:     Soft, non-tender, bowel sounds active all four quadrants,    no masses, no organomegaly  Extremities:   Extremities normal, atraumatic, no cyanosis or edema  Pulses:   2+ and symmetric all extremities  Skin:   Skin color, texture, turgor normal, no rashes or lesions  Lymph nodes:   Cervical, supraclavicular, and axillary nodes normal  Neurologic:   CNII-XII intact. Normal strength, sensation and reflexes      throughout   Assessment and Plan:    Left-sided chest wall pain Reassurance provided that this was musculoskeletal in nature likely due to a strained muscle. Since pain is well-controlled with when necessary use of tramadol I told him not to bother with filling of diclofenac prescription  Short-term memory loss He is having mild cognitive deficits but does not have any signs of Alzheimer's disease. He does have an area of encephalomalacia on his recent MRI of brain due to history of prior resection of meningioma.  Hypertension well controlled,  No change today  Neuropathy of foot The temperature changes in his left foot did not appear to be due to circulation. He has adequate pulses  and excellent capillary Refill. 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 Reassurance provided    Updated Medication List Outpatient Encounter Prescriptions as of 11/25/2011  Medication Sig Dispense Refill  . alendronate (FOSAMAX) 70 MG tablet Take 1 tablet (70 mg total) by mouth every 7 (seven)  days. Take with a full glass of water on an empty stomach.  21 tablet  3  . aspirin 81 MG tablet Take 81 mg by mouth daily.        Marland Kitchen atorvastatin (LIPITOR) 10 MG tablet Take 10 mg by mouth daily.        . ciclopirox (LOPROX) 0.77 % cream Apply topically as needed.        . clopidogrel (PLAVIX) 75 MG tablet Take 1 tablet (75 mg total) by mouth daily.  90 tablet  3  . finasteride (PROSCAR) 5 MG tablet Take 5 mg by mouth daily.        . fluocinonide (LIDEX) 0.05 % cream Apply 1 application topically as needed.        . hydrocortisone (WESTCORT) 0.2 % cream Apply 1 application topically as needed.        . Multiple Vitamins-Minerals (PRESERVISION AREDS PO) Take by mouth.        . terazosin (HYTRIN) 2 MG capsule Take 2 mg by mouth at bedtime.      . traMADol (ULTRAM) 50 MG tablet Take 1 tablet (50 mg total) by mouth every 6 (six) hours as needed for pain.  120 tablet  3  . triamcinolone cream (KENALOG) 0.1 % Apply topically 2 (two) times daily.  30 g  0  . DISCONTD: diclofenac (VOLTAREN) 75 MG EC tablet Take 1 tablet (75 mg total) by mouth 2 (two) times daily.  30 tablet  0  . DISCONTD: traMADol-acetaminophen (ULTRACET) 37.5-325 MG per tablet Take 1 tablet by mouth 4 (four) times daily as needed.  60 tablet  1    No orders of the defined types were placed in this encounter.    Return in about 3 months (around 02/25/2012).

## 2011-11-25 NOTE — Patient Instructions (Signed)
You may continue tramadol for pain as needed ,  Maximum is 4 daily  You do not need to pick up the diclofenac is the tramadol is working fine for  your pain   Your chest x ray was normal.  You do not have Alzheimers  Your circulation is fine.  Your foot is cold because of the nerve endings that were affected by your brain surgery.

## 2011-11-27 DIAGNOSIS — G5790 Unspecified mononeuropathy of unspecified lower limb: Secondary | ICD-10-CM | POA: Insufficient documentation

## 2011-11-27 NOTE — Assessment & Plan Note (Signed)
well controlled,  No change today

## 2011-11-27 NOTE — Assessment & Plan Note (Signed)
The temperature changes in his left foot did not appear to be due to circulation. He has adequate pulses and excellent capillary Refill. 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 Reassurance provided

## 2011-11-27 NOTE — Assessment & Plan Note (Signed)
He is having mild cognitive deficits but does not have any signs of Alzheimer's disease. He does have an area of encephalomalacia on his recent MRI of brain due to history of prior resection of meningioma.

## 2011-11-27 NOTE — Assessment & Plan Note (Signed)
Reassurance provided that this was musculoskeletal in nature likely due to a strained muscle. Since pain is well-controlled with when necessary use of tramadol I told him not to bother with filling of diclofenac prescription

## 2011-12-05 ENCOUNTER — Emergency Department: Payer: Self-pay | Admitting: Emergency Medicine

## 2011-12-05 LAB — CBC
HCT: 41.8 % (ref 40.0–52.0)
HGB: 13.8 g/dL (ref 13.0–18.0)
Platelet: 248 10*3/uL (ref 150–440)
RBC: 4.45 10*6/uL (ref 4.40–5.90)
WBC: 11.8 10*3/uL — ABNORMAL HIGH (ref 3.8–10.6)

## 2011-12-05 LAB — COMPREHENSIVE METABOLIC PANEL
Albumin: 4.1 g/dL (ref 3.4–5.0)
Alkaline Phosphatase: 91 U/L (ref 50–136)
Anion Gap: 9 (ref 7–16)
BUN: 39 mg/dL — ABNORMAL HIGH (ref 7–18)
Bilirubin,Total: 1.2 mg/dL — ABNORMAL HIGH (ref 0.2–1.0)
Calcium, Total: 8.9 mg/dL (ref 8.5–10.1)
Creatinine: 1.02 mg/dL (ref 0.60–1.30)
EGFR (African American): >60
EGFR (Non-African Amer.): >60
Glucose: 134 mg/dL — ABNORMAL HIGH (ref 65–99)
SGPT (ALT): 19 U/L
Sodium: 139 mmol/L (ref 136–145)
Total Protein: 7.4 g/dL (ref 6.4–8.2)

## 2011-12-05 LAB — URINALYSIS, COMPLETE
Bacteria: NONE SEEN
Bilirubin,UR: NEGATIVE
Glucose,UR: NEGATIVE mg/dL (ref 0–75)
Nitrite: NEGATIVE
WBC UR: 2 /HPF (ref 0–5)

## 2011-12-05 LAB — LIPASE, BLOOD: Lipase: 88 U/L (ref 73–393)

## 2011-12-07 ENCOUNTER — Encounter: Payer: Self-pay | Admitting: Internal Medicine

## 2012-01-11 ENCOUNTER — Encounter: Payer: Self-pay | Admitting: Internal Medicine

## 2012-01-11 ENCOUNTER — Ambulatory Visit (INDEPENDENT_AMBULATORY_CARE_PROVIDER_SITE_OTHER): Payer: 59 | Admitting: Internal Medicine

## 2012-01-11 VITALS — BP 160/84 | HR 80 | Temp 97.5°F | Resp 16 | Wt 160.5 lb

## 2012-01-11 DIAGNOSIS — R42 Dizziness and giddiness: Secondary | ICD-10-CM | POA: Insufficient documentation

## 2012-01-11 DIAGNOSIS — I10 Essential (primary) hypertension: Secondary | ICD-10-CM

## 2012-01-11 MED ORDER — METOPROLOL TARTRATE 25 MG PO TABS: 25.0000 mg | ORAL_TABLET | Freq: Two times a day (BID) | ORAL | Status: DC

## 2012-01-11 MED ORDER — DOCUSATE SODIUM 50 MG/5ML PO LIQD: ORAL | Status: DC

## 2012-01-11 MED ORDER — PREDNISONE (PAK) 10 MG PO TABS: ORAL_TABLET | ORAL | Status: AC

## 2012-01-11 NOTE — Progress Notes (Signed)
Patient ID: Derrick Sullivan, male   DOB: 1924-04-23, 78 y.o.   MRN: [id number]  Patient Active Problem List  Diagnoses  . Benign prostatic hypertrophy  . Benign meningioma  . Osteopenia  . Carotid artery stenosis, asymptomatic  . Diverticulosis of colon  . Biceps tendonitis  . Short-term memory loss  . Duodenal ulcer from aspirin/ibuprofen-like drugs (NSAID's)  . Hypertension  . Diabetes mellitus with peripheral artery disease  . Left-sided chest wall pain  . Rash  . Neuropathy of foot  . Dizziness - light-headed    Subjective:  CC:   Chief Complaint  Patient presents with  . Dizziness    HPI:   Derrick Sullivan a 78 y.o. male who presents with new onset dizziness which started this morning upon waking.  He was unable to walk initially, but denies a true feeling of vertigo so he laid back down for an hour.  He woke up after one hour and his symptoms hdd improved but not resolved. No sinus or allergy symptoms. He has had a similar prior episode and when he went to ED he was treated for vertigo.     Past Medical History  Diagnosis Date  . CAD (coronary artery disease)   . Peripheral vascular disease   . Degenerative disk disease   . Presbyacusis   . Osteoporosis   . Duodenal ulcer 07/2004    NSAID induced  . Hyperlipidemia   . Benign prostatic hypertrophy   . Benign meningioma 2006    s/p parietal resection  . Osteopenia   . Carotid artery stenosis, asymptomatic 2008    hemodynamically insignificant  . Diverticulosis of colon 2007  . Hypertension 09/26/2011    Past Surgical History  Procedure Date  . Shoulder surgery 2000    rotator cuff  . Gallbladder surgery 2000         The following portions of the patient's history were reviewed and updated as appropriate: Allergies, current medications, and problem list.    Review of Systems:   12 Pt  review of systems was negative except those addressed in the HPI,     History   Social History  .  Marital Status: Married    Spouse Name: N/A    Number of Children: N/A  . Years of Education: 16   Occupational History  .     Social History Main Topics  . Smoking status: Never Smoker   . Smokeless tobacco: Former Neurosurgeon    Quit date: 07/05/1956  . Alcohol Use: No  . Drug Use: No  . Sexually Active: No   Other Topics Concern  . Not on file   Social History Narrative  . No narrative on file    Objective:  BP 160/84  Pulse 80  Temp(Src) 97.5 F (36.4 C) (Oral)  Resp 16  Wt 160 lb 8 oz (72.802 kg)  SpO2 98%  General appearance: alert, cooperative and appears stated age Ears: normal TM's and external ear canals both ears Throat: lips, mucosa, and tongue normal; teeth and gums normal Neck: no adenopathy, no carotid bruit, supple, symmetrical, trachea midline and thyroid not enlarged, symmetric, no tenderness/mass/nodules Back: symmetric, no curvature. ROM normal. No CVA tenderness. Lungs: clear to auscultation bilaterally Heart: regular rate and rhythm, S1, S2 normal, no murmur, click, rub or gallop Abdomen: soft, non-tender; bowel sounds normal; no masses,  no organomegaly Pulses: 2+ and symmetric Skin: Skin color, texture, turgor normal. No rashes or lesions Lymph nodes: Cervical, supraclavicular, and axillary  nodes normal.  Assessment and Plan:  Dizziness - light-headed His neurologic exam is normal. His blood pressure is elevated and given the pollen season, I suspect mild allergic symptoms are causing inflammation and Eustachian tube dysfunction.   Will treat with antihistamine and prednisone for eustachian tube dysfunction and address bp  Hypertension Adding metoprolol    Updated Medication List Outpatient Encounter Prescriptions as of 01/11/2012  Medication Sig Dispense Refill  . alendronate (FOSAMAX) 70 MG tablet Take 1 tablet (70 mg total) by mouth every 7 (seven) days. Take with a full glass of water on an empty stomach.  21 tablet  3  . aspirin 81 MG  tablet Take 81 mg by mouth daily.        Marland Kitchen atorvastatin (LIPITOR) 10 MG tablet Take 10 mg by mouth daily.        . calcium carbonate (OS-CAL) 600 MG TABS Take 600 mg by mouth daily.      . ciclopirox (LOPROX) 0.77 % cream Apply topically as needed.        . clopidogrel (PLAVIX) 75 MG tablet Take 1 tablet (75 mg total) by mouth daily.  90 tablet  3  . finasteride (PROSCAR) 5 MG tablet Take 5 mg by mouth daily.        . fluocinonide (LIDEX) 0.05 % cream Apply 1 application topically as needed.        . hydrocortisone (WESTCORT) 0.2 % cream Apply 1 application topically as needed.        . Multiple Vitamins-Minerals (PRESERVISION AREDS PO) Take by mouth.        . terazosin (HYTRIN) 2 MG capsule Take 2 mg by mouth at bedtime.      . traMADol (ULTRAM) 50 MG tablet Take 50 mg by mouth as needed.      . triamcinolone cream (KENALOG) 0.1 % Apply topically 2 (two) times daily.  30 g  0  . docusate (COLACE) 50 MG/5ML liquid Use 3 drops in right ear at night to soften ear wax  100 mL  0  . metoprolol tartrate (LOPRESSOR) 25 MG tablet Take 1 tablet (25 mg total) by mouth 2 (two) times daily.  60 tablet  3  . predniSONE (STERAPRED UNI-PAK) 10 MG tablet 6 tablets on Day 1 , then reduce by 1 tablet daily until gone  21 tablet  0     No orders of the defined types were placed in this encounter.    Return in about 1 week (around 01/18/2012).

## 2012-01-11 NOTE — Patient Instructions (Addendum)
Your Symptoms of light headedness ,may be coming from mild allergic rhinitis causing some inner ear dysfunction  I would like you to take claritin daily (OTC) and a prednisone taper to deal with any inflammation i n the inner ear  Use liquid colace in the right ear every night to soften the ear wax.   I am adding metoprolol : one tablet twice daily  bc your pressure is too high  Return in next Thrusday afternoon for recheck on blood pressure and an ear cleaning

## 2012-01-12 ENCOUNTER — Encounter: Payer: Self-pay | Admitting: Internal Medicine

## 2012-01-12 NOTE — Assessment & Plan Note (Addendum)
His neurologic exam is normal. His blood pressure is elevated and given the pollen season, I suspect mild allergic symptoms are causing inflammation and Eustachian tube dysfunction.   Will treat with antihistamine and prednisone for eustachian tube dysfunction and address bp

## 2012-01-12 NOTE — Assessment & Plan Note (Signed)
Adding metoprolol

## 2012-01-19 ENCOUNTER — Ambulatory Visit: Payer: 59

## 2012-01-19 VITALS — BP 142/70

## 2012-01-19 DIAGNOSIS — R42 Dizziness and giddiness: Secondary | ICD-10-CM

## 2012-01-20 ENCOUNTER — Other Ambulatory Visit (INDEPENDENT_AMBULATORY_CARE_PROVIDER_SITE_OTHER): Payer: 59 | Admitting: *Deleted

## 2012-01-20 ENCOUNTER — Ambulatory Visit: Payer: 59

## 2012-01-20 ENCOUNTER — Telehealth: Payer: Self-pay | Admitting: *Deleted

## 2012-01-20 DIAGNOSIS — R350 Frequency of micturition: Secondary | ICD-10-CM

## 2012-01-20 LAB — POCT URINALYSIS DIPSTICK
Glucose, UA: NEGATIVE
Nitrite, UA: NEGATIVE
Urobilinogen, UA: 0.2

## 2012-01-20 NOTE — Telephone Encounter (Signed)
Urine is normal,  No signs of infection

## 2012-01-20 NOTE — Telephone Encounter (Signed)
Patient's wife informed/SLS 

## 2012-01-20 NOTE — Telephone Encounter (Signed)
Patient came in today for urine frequency, wanting to have his urine checked. It was normal. Results are in.

## 2012-03-21 ENCOUNTER — Other Ambulatory Visit: Payer: Self-pay | Admitting: *Deleted

## 2012-03-21 MED ORDER — FINASTERIDE 5 MG PO TABS: 5.0000 mg | ORAL_TABLET | Freq: Every day | ORAL | Status: DC

## 2012-03-21 MED ORDER — ATORVASTATIN CALCIUM 10 MG PO TABS: 10.0000 mg | ORAL_TABLET | Freq: Every day | ORAL | Status: DC

## 2012-03-23 ENCOUNTER — Telehealth: Payer: Self-pay | Admitting: *Deleted

## 2012-03-23 NOTE — Telephone Encounter (Signed)
Pt called requesting callback regarding medications, left message for pt to callback office.

## 2012-03-23 NOTE — Telephone Encounter (Signed)
Pt returned call at lunch and left message, Left message for pt to callback office.

## 2012-03-26 ENCOUNTER — Other Ambulatory Visit: Payer: Self-pay | Admitting: *Deleted

## 2012-03-26 MED ORDER — FINASTERIDE 5 MG PO TABS: 5.0000 mg | ORAL_TABLET | Freq: Every day | ORAL | Status: DC

## 2012-03-27 ENCOUNTER — Ambulatory Visit (INDEPENDENT_AMBULATORY_CARE_PROVIDER_SITE_OTHER): Payer: 59 | Admitting: Internal Medicine

## 2012-03-27 ENCOUNTER — Encounter: Payer: Self-pay | Admitting: Internal Medicine

## 2012-03-27 VITALS — BP 110/56 | HR 58 | Temp 97.9°F | Resp 14 | Wt 168.0 lb

## 2012-03-27 DIAGNOSIS — E119 Type 2 diabetes mellitus without complications: Secondary | ICD-10-CM

## 2012-03-27 DIAGNOSIS — E1159 Type 2 diabetes mellitus with other circulatory complications: Secondary | ICD-10-CM

## 2012-03-27 DIAGNOSIS — I10 Essential (primary) hypertension: Secondary | ICD-10-CM

## 2012-03-27 DIAGNOSIS — E1151 Type 2 diabetes mellitus with diabetic peripheral angiopathy without gangrene: Secondary | ICD-10-CM

## 2012-03-27 DIAGNOSIS — I798 Other disorders of arteries, arterioles and capillaries in diseases classified elsewhere: Secondary | ICD-10-CM

## 2012-03-27 DIAGNOSIS — I739 Peripheral vascular disease, unspecified: Secondary | ICD-10-CM

## 2012-03-27 DIAGNOSIS — E785 Hyperlipidemia, unspecified: Secondary | ICD-10-CM

## 2012-03-27 LAB — COMPREHENSIVE METABOLIC PANEL
ALT: 16 U/L (ref 0–53)
Alkaline Phosphatase: 62 U/L (ref 39–117)
Creatinine, Ser: 1 mg/dL (ref 0.4–1.5)
Glucose, Bld: 173 mg/dL — ABNORMAL HIGH (ref 70–99)
Sodium: 140 mEq/L (ref 135–145)
Total Bilirubin: 1 mg/dL (ref 0.3–1.2)
Total Protein: 6.7 g/dL (ref 6.0–8.3)

## 2012-03-27 LAB — URINALYSIS
Bilirubin Urine: NEGATIVE
Hgb urine dipstick: NEGATIVE
Ketones, ur: NEGATIVE
Leukocytes, UA: NEGATIVE
Specific Gravity, Urine: 1.01 (ref 1.000–1.030)
Urobilinogen, UA: 0.2 (ref 0.0–1.0)

## 2012-03-27 LAB — POCT URINALYSIS DIPSTICK
Blood, UA: NEGATIVE
Protein, UA: 30
Spec Grav, UA: 1.015
Urobilinogen, UA: 1
pH, UA: 6.5

## 2012-03-27 LAB — MICROALBUMIN / CREATININE URINE RATIO
Microalb Creat Ratio: 9.7 mg/g (ref 0.0–30.0)
Microalb, Ur: 11.2 mg/dL — ABNORMAL HIGH (ref 0.0–1.9)

## 2012-03-27 LAB — HEMOGLOBIN A1C: Hgb A1c MFr Bld: 6.3 % (ref 4.6–6.5)

## 2012-03-27 NOTE — Patient Instructions (Addendum)
I am checking you for diabetes today  You should cut back on your cookies and eat more peanut butter on low carb Sandwhich thins   We will check to see when  your next carotid doppler is due

## 2012-03-27 NOTE — Progress Notes (Signed)
Patient ID: Derrick Sullivan, male   DOB: August 05, 1924, 78 y.o.   MRN: [id number] Patient Active Problem List  Diagnosis  . Benign prostatic hypertrophy  . Benign meningioma  . Osteopenia  . Carotid stenosis, left  . Diverticulosis of colon  . Biceps tendonitis  . Short-term memory loss  . Duodenal ulcer from aspirin/ibuprofen-like drugs (NSAID's)  . Hypertension  . Diabetes mellitus with peripheral artery disease  . Left-sided chest wall pain  . Rash  . Neuropathy of foot  . Dizziness - light-headed    Subjective:  CC:   Chief Complaint  Patient presents with  . Follow-up    6 month    HPI:   Derrick Sullivan a 78 y.o. male who presents 6 month follow up on chronic issues. Has gained 8 lbs but not sure how .  Eats light but Grazes all day long. No dizzy spells lately.  No recent falls..  Sleeping ok.  Increased urinary frquency,  New onset diabetes in Jan 6.2 a1c.  Has a sweet tooth , eats a lot of cookies. Still physically active; no episodes of tremulousness or other symptoms to suggest hypoglycemia.   Past Medical History  Diagnosis Date  . CAD (coronary artery disease)   . Peripheral vascular disease   . Degenerative disk disease   . Presbyacusis   . Osteoporosis   . Duodenal ulcer 07/2004    NSAID induced  . Hyperlipidemia   . Benign prostatic hypertrophy   . Benign meningioma 2006    s/p parietal resection  . Osteopenia   . Carotid artery stenosis, asymptomatic 2008    hemodynamically insignificant  . Diverticulosis of colon 2007  . Hypertension 09/26/2011    Past Surgical History  Procedure Date  . Shoulder surgery 2000    rotator cuff  . Gallbladder surgery 2000     The following portions of the patient's history were reviewed and updated as appropriate: Allergies, current medications, and problem list.    Review of Systems:  Review of Systems  Constitutional: Negative.   HENT: Negative.   Eyes: Negative.   Respiratory: Negative.     Cardiovascular: Negative.   Gastrointestinal: Negative.   Genitourinary: Positive for frequency.  Musculoskeletal: Negative.   Skin: Negative.   Neurological: Negative.  Negative for dizziness.  Endo/Heme/Allergies: Negative.   Psychiatric/Behavioral: Negative.          History   Social History  . Marital Status: Married    Spouse Name: N/A    Number of Children: N/A  . Years of Education: 16   Occupational History  .     Social History Main Topics  . Smoking status: Never Smoker   . Smokeless tobacco: Former Neurosurgeon    Quit date: 07/05/1956  . Alcohol Use: No  . Drug Use: No  . Sexually Active: No   Other Topics Concern  . Not on file   Social History Narrative  . No narrative on file    Objective:  BP 110/56  Pulse 58  Temp 97.9 F (36.6 C) (Oral)  Resp 14  Wt 168 lb (76.204 kg)  SpO2 96%  General appearance: alert, cooperative and appears stated age Ears: normal TM's and external ear canals both ears Throat: lips, mucosa, and tongue normal; teeth and gums normal Neck: no adenopathy, no carotid bruit, supple, symmetrical, trachea midline and thyroid not enlarged, symmetric, no tenderness/mass/nodules Back: symmetric, no curvature. ROM normal. No CVA tenderness. Lungs: clear to auscultation bilaterally Heart: regular rate and  rhythm, S1, S2 normal, no murmur, click, rub or gallop Abdomen: soft, non-tender; bowel sounds normal; no masses,  no organomegaly Pulses: 2+ and symmetric Skin: Skin color, texture, turgor normal. No rashes or lesions Lymph nodes: Cervical, supraclavicular, and axillary nodes normal.  Assessment and Plan: Diabetes mellitus with peripheral artery disease With carotid stenosis done by recent vascular study on left carotid artery. Continue aspirin and statin. Last A1c was 6.2 without medications. He is due for hemoglobin A1c. He has been having increased urinary frequency without dysuria, suggesting that his diabetes may now the  uncontrolled. We'll discuss low glycemic index diet with him if hemoglobin A1c was below 7.  Hypertension Well-controlled on current medications. No changes today  Other and unspecified hyperlipidemia Managed with statin therapy which is well tolerated. Hemoglobin A1c and LDL were both ordered for today.  goal is less than 70 given his diabetes and peripheral artery disease   Updated Medication List Outpatient Encounter Prescriptions as of 03/27/2012  Medication Sig Dispense Refill  . alendronate (FOSAMAX) 70 MG tablet Take 1 tablet (70 mg total) by mouth every 7 (seven) days. Take with a full glass of water on an empty stomach.  21 tablet  3  . aspirin 81 MG tablet Take 81 mg by mouth daily.        Marland Kitchen atorvastatin (LIPITOR) 10 MG tablet Take 1 tablet (10 mg total) by mouth daily.  30 tablet  3  . calcium carbonate (OS-CAL) 600 MG TABS Take 600 mg by mouth daily.      . ciclopirox (LOPROX) 0.77 % cream Apply topically as needed.        . clopidogrel (PLAVIX) 75 MG tablet Take 1 tablet (75 mg total) by mouth daily.  90 tablet  3  . finasteride (PROSCAR) 5 MG tablet Take 1 tablet (5 mg total) by mouth daily.  30 tablet  4  . fluocinonide (LIDEX) 0.05 % cream Apply 1 application topically as needed.        . hydrocortisone (WESTCORT) 0.2 % cream Apply 1 application topically as needed.        . metoprolol tartrate (LOPRESSOR) 25 MG tablet Take 1 tablet (25 mg total) by mouth 2 (two) times daily.  60 tablet  3  . Multiple Vitamins-Minerals (PRESERVISION AREDS PO) Take by mouth.        . terazosin (HYTRIN) 2 MG capsule Take 2 mg by mouth at bedtime.      . traMADol (ULTRAM) 50 MG tablet Take 50 mg by mouth as needed.      . triamcinolone cream (KENALOG) 0.1 % Apply topically 2 (two) times daily.  30 g  0  . DISCONTD: docusate (COLACE) 50 MG/5ML liquid Use 3 drops in right ear at night to soften ear wax  100 mL  0

## 2012-03-27 NOTE — Assessment & Plan Note (Signed)
Managed with statin therapy which is well tolerated. Hemoglobin A1c and LDL were both ordered for today.  goal is less than 70 given his diabetes and peripheral artery disease

## 2012-03-27 NOTE — Assessment & Plan Note (Signed)
Well controlled on current medications.  No changes today. 

## 2012-03-27 NOTE — Assessment & Plan Note (Addendum)
With carotid stenosis done by recent vascular study on left carotid artery. Continue aspirin and statin. Last A1c was 6.2 without medications. He is due for hemoglobin A1c. He has been having increased urinary frequency without dysuria, suggesting that his diabetes may now the uncontrolled. We'll discuss low glycemic index diet with him if hemoglobin A1c was below 7.

## 2012-03-30 ENCOUNTER — Telehealth: Payer: Self-pay | Admitting: Internal Medicine

## 2012-03-30 NOTE — Telephone Encounter (Signed)
Carotid dopplers,  Need to check with [facility] to to see if he is due yet

## 2012-03-30 NOTE — Telephone Encounter (Signed)
Patient called he could not remember the name of the test that was discussed at the last visit. He wanted to know when it would be scheduled . I don't have any information to schedule the patient. Please help.

## 2012-04-02 ENCOUNTER — Telehealth: Payer: Self-pay | Admitting: Internal Medicine

## 2012-04-02 NOTE — Telephone Encounter (Signed)
Patient notified of appointment with Dr. [facility] on 8.5.13 @ 1:00.

## 2012-04-02 NOTE — Telephone Encounter (Signed)
Pt called stating that someone from this office called about his cartiod artery referral

## 2012-04-04 NOTE — Telephone Encounter (Signed)
Appointment made with [facility] . Patient is aware .

## 2012-04-17 ENCOUNTER — Other Ambulatory Visit: Payer: Self-pay | Admitting: Internal Medicine

## 2012-04-17 MED ORDER — ATORVASTATIN CALCIUM 10 MG PO TABS: 10.0000 mg | ORAL_TABLET | Freq: Every day | ORAL | Status: DC

## 2012-04-17 MED ORDER — FINASTERIDE 5 MG PO TABS: 5.0000 mg | ORAL_TABLET | Freq: Every day | ORAL | Status: DC

## 2012-04-30 ENCOUNTER — Other Ambulatory Visit: Payer: Self-pay | Admitting: Internal Medicine

## 2012-04-30 MED ORDER — METOPROLOL TARTRATE 25 MG PO TABS: 25.0000 mg | ORAL_TABLET | Freq: Two times a day (BID) | ORAL | Status: DC

## 2012-05-10 ENCOUNTER — Encounter: Payer: Self-pay | Admitting: Internal Medicine

## 2012-05-10 DIAGNOSIS — I340 Nonrheumatic mitral (valve) insufficiency: Secondary | ICD-10-CM

## 2012-05-11 DIAGNOSIS — I340 Nonrheumatic mitral (valve) insufficiency: Secondary | ICD-10-CM | POA: Insufficient documentation

## 2012-05-11 NOTE — Assessment & Plan Note (Addendum)
Mild, with mild tricuspid valve insufficiency by recent ECHO ([place])  ,  EF 55%

## 2012-07-16 ENCOUNTER — Telehealth: Payer: Self-pay | Admitting: Internal Medicine

## 2012-07-16 MED ORDER — TERAZOSIN HCL 2 MG PO CAPS: 2.0000 mg | ORAL_CAPSULE | Freq: Every day | ORAL | Status: DC

## 2012-07-16 NOTE — Telephone Encounter (Signed)
Ok to refill,  Authorized in epic and sent  

## 2012-07-16 NOTE — Telephone Encounter (Signed)
Refill request for terazosin hcl 2 mg cap #90 Sig: take 1 capsule by mouth at bedtime

## 2012-09-28 ENCOUNTER — Ambulatory Visit: Payer: 59 | Admitting: Internal Medicine

## 2012-10-01 ENCOUNTER — Ambulatory Visit (INDEPENDENT_AMBULATORY_CARE_PROVIDER_SITE_OTHER): Payer: 59 | Admitting: Internal Medicine

## 2012-10-01 ENCOUNTER — Encounter: Payer: Self-pay | Admitting: Internal Medicine

## 2012-10-01 VITALS — BP 140/64 | HR 57 | Temp 97.8°F | Resp 16 | Wt 175.5 lb

## 2012-10-01 DIAGNOSIS — R5383 Other fatigue: Secondary | ICD-10-CM

## 2012-10-01 DIAGNOSIS — I6522 Occlusion and stenosis of left carotid artery: Secondary | ICD-10-CM

## 2012-10-01 DIAGNOSIS — R5381 Other malaise: Secondary | ICD-10-CM

## 2012-10-01 DIAGNOSIS — I739 Peripheral vascular disease, unspecified: Secondary | ICD-10-CM

## 2012-10-01 DIAGNOSIS — D320 Benign neoplasm of cerebral meninges: Secondary | ICD-10-CM

## 2012-10-01 DIAGNOSIS — E785 Hyperlipidemia, unspecified: Secondary | ICD-10-CM

## 2012-10-01 DIAGNOSIS — E1159 Type 2 diabetes mellitus with other circulatory complications: Secondary | ICD-10-CM

## 2012-10-01 DIAGNOSIS — D329 Benign neoplasm of meninges, unspecified: Secondary | ICD-10-CM

## 2012-10-01 DIAGNOSIS — E559 Vitamin D deficiency, unspecified: Secondary | ICD-10-CM

## 2012-10-01 DIAGNOSIS — E1151 Type 2 diabetes mellitus with diabetic peripheral angiopathy without gangrene: Secondary | ICD-10-CM

## 2012-10-01 DIAGNOSIS — K5909 Other constipation: Secondary | ICD-10-CM

## 2012-10-01 DIAGNOSIS — I10 Essential (primary) hypertension: Secondary | ICD-10-CM

## 2012-10-01 DIAGNOSIS — E119 Type 2 diabetes mellitus without complications: Secondary | ICD-10-CM

## 2012-10-01 DIAGNOSIS — I6529 Occlusion and stenosis of unspecified carotid artery: Secondary | ICD-10-CM

## 2012-10-01 DIAGNOSIS — K5904 Chronic idiopathic constipation: Secondary | ICD-10-CM

## 2012-10-01 LAB — CBC WITH DIFFERENTIAL/PLATELET
Basophils Absolute: 0 10*3/uL (ref 0.0–0.1)
Eosinophils Relative: 3 % (ref 0.0–5.0)
HCT: 37.6 % — ABNORMAL LOW (ref 39.0–52.0)
Lymphs Abs: 1.5 10*3/uL (ref 0.7–4.0)
Monocytes Absolute: 0.6 10*3/uL (ref 0.1–1.0)
Monocytes Relative: 7.7 % (ref 3.0–12.0)
Neutrophils Relative %: 70.9 % (ref 43.0–77.0)
Platelets: 214 10*3/uL (ref 150.0–400.0)
RDW: 13.2 % (ref 11.5–14.6)
WBC: 8 10*3/uL (ref 4.5–10.5)

## 2012-10-01 LAB — COMPREHENSIVE METABOLIC PANEL
Albumin: 3.9 g/dL (ref 3.5–5.2)
BUN: 23 mg/dL (ref 6–23)
CO2: 28 mEq/L (ref 19–32)
Calcium: 9.6 mg/dL (ref 8.4–10.5)
Chloride: 102 mEq/L (ref 96–112)
Creatinine, Ser: 1 mg/dL (ref 0.4–1.5)
GFR: 76.12 mL/min (ref >60.00)
Potassium: 4.2 mEq/L (ref 3.5–5.1)

## 2012-10-01 LAB — HEMOGLOBIN A1C: Hgb A1c MFr Bld: 6.3 % (ref 4.6–6.5)

## 2012-10-01 LAB — LDL CHOLESTEROL, DIRECT: Direct LDL: 76.2 mg/dL

## 2012-10-01 MED ORDER — LACTULOSE 20 GM/30ML PO SOLN: 30.0000 mL | Freq: Four times a day (QID) | ORAL | Status: DC | PRN

## 2012-10-01 NOTE — Progress Notes (Signed)
Patient ID: Derrick Sullivan, male   DOB: 03/11/1924, 79 y.o.   MRN: [id number]  Patient Active Problem List  Diagnosis  . Benign prostatic hypertrophy  . Benign meningioma  . Osteopenia  . Carotid stenosis, left  . Diverticulosis of colon  . Short-term memory loss  . Duodenal ulcer from aspirin/ibuprofen-like drugs (NSAID's)  . Hypertension  . Diabetes mellitus with peripheral artery disease  . Neuropathy of foot  . Other and unspecified hyperlipidemia  . Mitral insufficiency  . Constipation - functional    Subjective:  CC:   Chief Complaint  Patient presents with  . Follow-up    HPI:   Derrick Sullivan a 79 y.o. male who presents for 3 month follow up on diabetes, hypertension, hyperlipidemia.  He feels generally well with a few minor complaints. Taking his medication without adverse effects,  No hypoglycemic events,  No falls in the last 6 months,  No symptoms of depression,  Sleeping well.Marland Kitchen 1) Constipation for the  Last 10 days.,  Not taking any narcotics.  Not drinking enough water.  Has stopped taking mira lax on a regular basis.  No straining,  No blood noticed,  Passes small caliber stools alternating with regular caliber stools.    2) left sided chest wall pain ,  Intermittent ,  Relieved with tramadol. Aggravated by moving the arm.  Pectoralis muscle location.    Past Medical History  Diagnosis Date  . CAD (coronary artery disease)   . Peripheral vascular disease   . Degenerative disk disease   . Presbyacusis   . Osteoporosis   . Duodenal ulcer 07/2004    NSAID induced  . Hyperlipidemia   . Benign prostatic hypertrophy   . Benign meningioma 2006    s/p parietal resection  . Osteopenia   . Carotid artery stenosis, asymptomatic 2008    hemodynamically insignificant  . Diverticulosis of colon 2007  . Hypertension 09/26/2011    Past Surgical History  Procedure Date  . Shoulder surgery 2000    rotator cuff  . Gallbladder surgery 2000  . Brain surgery       meningioma resection         The following portions of the patient's history were reviewed and updated as appropriate: Allergies, current medications, and problem list.    Review of Systems: Patient denies headache, fevers, malaise, unintentional weight loss, skin rash, eye pain, sinus congestion and sinus pain, sore throat, dysphagia,  hemoptysis , cough, dyspnea, wheezing, chest pain, palpitations, orthopnea, edema, abdominal pain, nausea, melena, diarrhea, constipation, flank pain, dysuria, hematuria, urinary  Frequency, nocturia, numbness, tingling, seizures,  Focal weakness, Loss of consciousness,  Tremor, insomnia, depression, anxiety, and suicidal ideation.         History   Social History  . Marital Status: Married    Spouse Name: N/A    Number of Children: N/A  . Years of Education: 16   Occupational History  .     Social History Main Topics  . Smoking status: Never Smoker   . Smokeless tobacco: Former Neurosurgeon    Quit date: 07/05/1956  . Alcohol Use: No  . Drug Use: No  . Sexually Active: No   Other Topics Concern  . Not on file   Social History Narrative  . No narrative on file    Objective:  BP 140/64  Pulse 57  Temp 97.8 F (36.6 C) (Oral)  Resp 16  Wt 175 lb 8 oz (79.606 kg)  SpO2 97%  General  appearance: alert, cooperative and appears stated age Ears: normal TM's and external ear canals both ears Throat: lips, mucosa, and tongue normal; teeth and gums normal Neck: no adenopathy, no carotid bruit, supple, symmetrical, trachea midline and thyroid not enlarged, symmetric, no tenderness/mass/nodules Back: symmetric, no curvature. ROM normal. No CVA tenderness. Lungs: clear to auscultation bilaterally Heart: regular rate and rhythm, S1, S2 normal, no murmur, click, rub or gallop Abdomen: soft, non-tender; bowel sounds normal; no masses,  no organomegaly Pulses: 2+ and symmetric Skin: Skin color, texture, turgor normal. No rashes or  lesions Lymph nodes: Cervical, supraclavicular, and axillary nodes normal.  Assessment and Plan:  Benign meningioma Stable  By serial MRIs . No new symptoms   Diabetes mellitus with peripheral artery disease Well controlled on current regimen. Aic is 6.3 Renal function stable, no changes today.continue asa, statin ace inhibitor,  Reminder for diabetic eye exam due. Foot exam notable for decreased sensation secondary to chronic neuropathy from meningioma. No cal louses or foot ulcers,  Toenails uneven and in need of trimming. He has deferred podiatry referral.   Carotid stenosis, left Asymptomatic, noncritical,  Continue statin , asa.   Hypertension Well controlled on current regimen. Renal function stable, no changes today.  Other and unspecified hyperlipidemia LDL is 60 on current therapy. No changes today.  Constipation - functional Intermittent, no warnign signs,   Lactulose prn,  Daily mirialax use recommended,  Lytes and Tsh normal.    Updated Medication List Outpatient Encounter Prescriptions as of 10/01/2012  Medication Sig Dispense Refill  . alendronate (FOSAMAX) 70 MG tablet Take 1 tablet (70 mg total) by mouth every 7 (seven) days. Take with a full glass of water on an empty stomach.  21 tablet  3  . aspirin 81 MG tablet Take 81 mg by mouth daily.        Marland Kitchen atorvastatin (LIPITOR) 10 MG tablet Take 1 tablet (10 mg total) by mouth daily.  90 tablet  3  . calcium carbonate (OS-CAL) 600 MG TABS Take 600 mg by mouth daily.      . clopidogrel (PLAVIX) 75 MG tablet Take 1 tablet (75 mg total) by mouth daily.  90 tablet  3  . finasteride (PROSCAR) 5 MG tablet Take 1 tablet (5 mg total) by mouth daily.  90 tablet  4  . metoprolol tartrate (LOPRESSOR) 25 MG tablet Take 1 tablet (25 mg total) by mouth 2 (two) times daily.  180 tablet  3  . Multiple Vitamins-Minerals (PRESERVISION AREDS PO) Take by mouth.        . terazosin (HYTRIN) 2 MG capsule Take 1 capsule (2 mg total) by mouth at  bedtime.  90 capsule  3  . traMADol (ULTRAM) 50 MG tablet Take 50 mg by mouth as needed.      . ciclopirox (LOPROX) 0.77 % cream Apply topically as needed.        . fluocinonide (LIDEX) 0.05 % cream Apply 1 application topically as needed.        . hydrocortisone (WESTCORT) 0.2 % cream Apply 1 application topically as needed.        . Lactulose 20 GM/30ML SOLN Take 30 mLs (20 g total) by mouth every 6 (six) hours as needed. To relieve constipation  240 mL  1  . triamcinolone cream (KENALOG) 0.1 % Apply topically 2 (two) times daily.  30 g  0     Orders Placed This Encounter  Procedures  . Vitamin D 25 hydroxy  . Comprehensive  metabolic panel  . Hemoglobin A1c  . CBC with Differential  . LDL cholesterol, direct  . HM DIABETES FOOT EXAM    Return in about 3 months (around 12/30/2012).

## 2012-10-01 NOTE — Patient Instructions (Addendum)
I will  Call in a medication called lactulose to help relieve your constipation after one or two doses.  Then resume miralax on a daily vasis   Please see Dr Dorcas Mcmurray to have your eyes checked since you have diet controlled diabetes .  This needs to happen once a year  You have a pulled muscle in your chest wall.  Try using a heating pad and 200 mg ibuprofen or 1 aleve as needed for your pain   We checked your electolytes and your diabetes test today

## 2012-10-02 ENCOUNTER — Encounter: Payer: Self-pay | Admitting: Internal Medicine

## 2012-10-02 DIAGNOSIS — K5904 Chronic idiopathic constipation: Secondary | ICD-10-CM | POA: Insufficient documentation

## 2012-10-02 NOTE — Assessment & Plan Note (Signed)
Well controlled on current regimen. Renal function stable, no changes today. 

## 2012-10-02 NOTE — Assessment & Plan Note (Signed)
Asymptomatic, noncritical,  Continue statin , asa.

## 2012-10-02 NOTE — Assessment & Plan Note (Signed)
Intermittent, no warnign signs,   Lactulose prn,  Daily mirialax use recommended,  Lytes and Tsh normal.

## 2012-10-02 NOTE — Assessment & Plan Note (Signed)
Stable  By serial MRIs . No new symptoms

## 2012-10-02 NOTE — Assessment & Plan Note (Signed)
Well controlled on current regimen. Aic is 6.3 Renal function stable, no changes today.continue asa, statin ace inhibitor,  Reminder for diabetic eye exam due. Foot exam notable for decreased sensation secondary to chronic neuropathy from meningioma. No cal louses or foot ulcers,  Toenails uneven and in need of trimming. He has deferred podiatry referral.

## 2012-10-02 NOTE — Assessment & Plan Note (Signed)
LDL is 60 on current therapy. No changes today.

## 2012-10-22 ENCOUNTER — Other Ambulatory Visit: Payer: Self-pay | Admitting: *Deleted

## 2012-10-22 MED ORDER — CLOPIDOGREL BISULFATE 75 MG PO TABS: 75.0000 mg | ORAL_TABLET | Freq: Every day | ORAL | Status: DC

## 2012-10-22 NOTE — Telephone Encounter (Signed)
Med filled.  

## 2013-03-14 ENCOUNTER — Ambulatory Visit (INDEPENDENT_AMBULATORY_CARE_PROVIDER_SITE_OTHER): Payer: 59 | Admitting: Internal Medicine

## 2013-03-14 VITALS — BP 138/58 | HR 58 | Temp 97.8°F | Resp 12 | Wt 169.5 lb

## 2013-03-14 DIAGNOSIS — E1159 Type 2 diabetes mellitus with other circulatory complications: Secondary | ICD-10-CM

## 2013-03-14 DIAGNOSIS — R21 Rash and other nonspecific skin eruption: Secondary | ICD-10-CM

## 2013-03-14 DIAGNOSIS — I10 Essential (primary) hypertension: Secondary | ICD-10-CM

## 2013-03-14 DIAGNOSIS — I739 Peripheral vascular disease, unspecified: Secondary | ICD-10-CM

## 2013-03-14 DIAGNOSIS — E785 Hyperlipidemia, unspecified: Secondary | ICD-10-CM

## 2013-03-14 DIAGNOSIS — E1151 Type 2 diabetes mellitus with diabetic peripheral angiopathy without gangrene: Secondary | ICD-10-CM

## 2013-03-14 DIAGNOSIS — E119 Type 2 diabetes mellitus without complications: Secondary | ICD-10-CM

## 2013-03-14 LAB — COMPREHENSIVE METABOLIC PANEL
AST: 19 U/L (ref 0–37)
Alkaline Phosphatase: 70 U/L (ref 39–117)
BUN: 27 mg/dL — ABNORMAL HIGH (ref 6–23)
Glucose, Bld: 102 mg/dL — ABNORMAL HIGH (ref 70–99)
Sodium: 136 mEq/L (ref 135–145)
Total Bilirubin: 0.7 mg/dL (ref 0.3–1.2)

## 2013-03-14 LAB — LDL CHOLESTEROL, DIRECT: Direct LDL: 74.3 mg/dL

## 2013-03-14 LAB — HM DIABETES FOOT EXAM

## 2013-03-14 MED ORDER — NYSTATIN 100000 UNIT/GM EX OINT: TOPICAL_OINTMENT | Freq: Two times a day (BID) | CUTANEOUS | Status: DC

## 2013-03-14 NOTE — Progress Notes (Signed)
Patient ID: Derrick Sullivan, male   DOB: 1924-07-02, 79 y.o.   MRN: [id number]   Patient Active Problem List   Diagnosis Date Noted  . Rash and nonspecific skin eruption 03/16/2013  . Constipation - functional 10/02/2012  . Mitral insufficiency 05/11/2012  . Other and unspecified hyperlipidemia 03/27/2012  . Neuropathy of foot 11/27/2011  . Duodenal ulcer from aspirin/ibuprofen-like drugs (NSAID's) 09/26/2011  . Hypertension 09/26/2011  . Diabetes mellitus with peripheral artery disease 09/26/2011  . Short-term memory loss 08/23/2011  . Benign prostatic hypertrophy   . Benign meningioma   . Osteopenia   . Carotid stenosis, left   . Diverticulosis of colon     Subjective:  CC:   Chief Complaint  Patient presents with  . Follow-up    HPI:    Derrick Sullivan a 79 y.o. male who presents for follow up on chronic conditions including diet controlled diabetes. He feels generally well except for a mildly pruritic macular rash on his  left instep which has been present for 3 weeks,  And was treated by dermatology with a unknown cream which provided temporary improvement but resulted in recurrance and spread to the right instep.  He has a chronic facial skin condition which is treated with steroids.      Past Medical History  Diagnosis Date  . CAD (coronary artery disease)   . Peripheral vascular disease   . Degenerative disk disease   . Presbyacusis   . Osteoporosis   . Duodenal ulcer 07/2004    NSAID induced  . Hyperlipidemia   . Benign prostatic hypertrophy   . Benign meningioma 2006    s/p parietal resection  . Osteopenia   . Carotid artery stenosis, asymptomatic 2008    hemodynamically insignificant  . Diverticulosis of colon 2007  . Hypertension 09/26/2011    Past Surgical History  Procedure Laterality Date  . Shoulder surgery  2000    rotator cuff  . Gallbladder surgery  2000  . Brain surgery      meningioma resection       The following portions of  the patient's history were reviewed and updated as appropriate: Allergies, current medications, and problem list.    Review of Systems:   12 Pt  review of systems was negative except those addressed in the HPI,     History   Social History  . Marital Status: Married    Spouse Name: N/A    Number of Children: N/A  . Years of Education: 16   Occupational History  .     Social History Main Topics  . Smoking status: Never Smoker   . Smokeless tobacco: Former Neurosurgeon    Quit date: 07/05/1956  . Alcohol Use: No  . Drug Use: No  . Sexually Active: No   Other Topics Concern  . Not on file   Social History Narrative  . No narrative on file    Objective:  BP 138/58  Pulse 58  Temp(Src) 97.8 F (36.6 C) (Oral)  Resp 12  Wt 169 lb 8 oz (76.885 kg)  BMI 25.78 kg/m2  SpO2 98%  General appearance: alert, cooperative and appears stated age Ears: normal TM's and external ear canals both ears Throat: lips, mucosa, and tongue normal; teeth and gums normal Neck: no adenopathy, no carotid bruit, supple, symmetrical, trachea midline and thyroid not enlarged, symmetric, no tenderness/mass/nodules Back: symmetric, no curvature. ROM normal. No CVA tenderness. Lungs: clear to auscultation bilaterally Heart: regular rate and rhythm, S1,  S2 normal, no murmur, click, rub or gallop Abdomen: soft, non-tender; bowel sounds normal; no masses,  no organomegaly Pulses: 2+ and symmetric Skin: Skin color, texture, turgor normal. No rashes or lesions Lymph nodes: Cervical, supraclavicular, and axillary nodes normal. Foot exam:  Nails are well trimmed,  No callouses,  Sensation deficits noted on left foot  Assessment and Plan:  Diabetes mellitus with peripheral artery disease Well controlled on current regimen. Aic is 6.4 Renal function stable, no changes today.continue asa, statin ace inhibitor,  Reminder for diabetic eye exam due. Foot exam notable for decreased sensation secondary to chronic  neuropathy from meningioma. No callouses or foot ulcers,    Rash and nonspecific skin eruption He has a lichenified macular patch on left instep that has a raised border.  Trial of nystatin   Hypertension Well controlled on current regimen. Renal function stable, no changes today.  Other and unspecified hyperlipidemia LDL and triglycerides are at goal on current medications. He has no side effects and liver enzymes are normal. No changes today    Updated Medication List Outpatient Encounter Prescriptions as of 03/14/2013  Medication Sig Dispense Refill  . alendronate (FOSAMAX) 70 MG tablet Take 1 tablet (70 mg total) by mouth every 7 (seven) days. Take with a full glass of water on an empty stomach.  21 tablet  3  . aspirin 81 MG tablet Take 81 mg by mouth daily.        Marland Kitchen atorvastatin (LIPITOR) 10 MG tablet Take 1 tablet (10 mg total) by mouth daily.  90 tablet  3  . calcium carbonate (OS-CAL) 600 MG TABS Take 600 mg by mouth daily.      . clopidogrel (PLAVIX) 75 MG tablet Take 1 tablet (75 mg total) by mouth daily.  90 tablet  3  . finasteride (PROSCAR) 5 MG tablet Take 1 tablet (5 mg total) by mouth daily.  90 tablet  4  . metoprolol tartrate (LOPRESSOR) 25 MG tablet Take 1 tablet (25 mg total) by mouth 2 (two) times daily.  180 tablet  3  . Multiple Vitamins-Minerals (PRESERVISION AREDS PO) Take by mouth.        . terazosin (HYTRIN) 2 MG capsule Take 1 capsule (2 mg total) by mouth at bedtime.  90 capsule  3  . traMADol (ULTRAM) 50 MG tablet Take 50 mg by mouth as needed.      . ciclopirox (LOPROX) 0.77 % cream Apply topically as needed.        . fluocinonide (LIDEX) 0.05 % cream Apply 1 application topically as needed.        . hydrocortisone (WESTCORT) 0.2 % cream Apply 1 application topically as needed.        . Lactulose 20 GM/30ML SOLN Take 30 mLs (20 g total) by mouth every 6 (six) hours as needed. To relieve constipation  240 mL  1  . nystatin ointment (MYCOSTATIN) Apply  topically 2 (two) times daily.  30 g  1   No facility-administered encounter medications on file as of 03/14/2013.     Orders Placed This Encounter  Procedures  . Hemoglobin A1c  . Comprehensive metabolic panel  . LDL cholesterol, direct  . [facility] DIABETES FOOT EXAM    No Follow-up on file.

## 2013-03-14 NOTE — Patient Instructions (Signed)
Your foot rash may be a fungal infection.  I am prescribing an oinment to kill the fungus,  Use it twice daily and it should improve in a week or two

## 2013-03-16 ENCOUNTER — Encounter: Payer: Self-pay | Admitting: Internal Medicine

## 2013-03-16 DIAGNOSIS — L270 Generalized skin eruption due to drugs and medicaments taken internally: Secondary | ICD-10-CM | POA: Insufficient documentation

## 2013-03-16 NOTE — Assessment & Plan Note (Signed)
He has a lichenified macular patch on left instep that has a raised border.  Trial of nystatin

## 2013-03-16 NOTE — Assessment & Plan Note (Signed)
Well controlled on current regimen. Renal function stable, no changes today. 

## 2013-03-16 NOTE — Assessment & Plan Note (Addendum)
Well controlled on current regimen. Aic is 6.4 Renal function stable, no changes today.continue asa, statin ace inhibitor,  Reminder for diabetic eye exam due. Foot exam notable for decreased sensation secondary to chronic neuropathy from meningioma. No callouses or foot ulcers,

## 2013-03-16 NOTE — Assessment & Plan Note (Signed)
LDL and triglycerides are at goal on current medications. He has no side effects and liver enzymes are normal. No changes today  

## 2013-05-13 ENCOUNTER — Encounter: Payer: Self-pay | Admitting: Internal Medicine

## 2013-05-13 ENCOUNTER — Ambulatory Visit (INDEPENDENT_AMBULATORY_CARE_PROVIDER_SITE_OTHER): Payer: 59 | Admitting: Internal Medicine

## 2013-05-13 VITALS — BP 172/78 | HR 71 | Temp 97.5°F | Resp 14 | Ht 69.0 in | Wt 173.5 lb

## 2013-05-13 DIAGNOSIS — R2681 Unsteadiness on feet: Secondary | ICD-10-CM | POA: Insufficient documentation

## 2013-05-13 DIAGNOSIS — R269 Unspecified abnormalities of gait and mobility: Secondary | ICD-10-CM

## 2013-05-13 DIAGNOSIS — D320 Benign neoplasm of cerebral meninges: Secondary | ICD-10-CM

## 2013-05-13 DIAGNOSIS — I6529 Occlusion and stenosis of unspecified carotid artery: Secondary | ICD-10-CM

## 2013-05-13 DIAGNOSIS — D329 Benign neoplasm of meninges, unspecified: Secondary | ICD-10-CM

## 2013-05-13 DIAGNOSIS — R55 Syncope and collapse: Secondary | ICD-10-CM

## 2013-05-13 DIAGNOSIS — I6522 Occlusion and stenosis of left carotid artery: Secondary | ICD-10-CM

## 2013-05-13 DIAGNOSIS — R413 Other amnesia: Secondary | ICD-10-CM

## 2013-05-13 NOTE — Patient Instructions (Addendum)
You are due for your annual carotid ultrasound  You are being scheduled for the carotid ultrasound and an MRI of your brain  Amber will call you to arrange these studies  You will also be referred to Dr Sherryll Burger or Dr. Malvin Johns at the [facility]. They are neurologists

## 2013-05-13 NOTE — Assessment & Plan Note (Signed)
Given his recent syncopal episode, will repeat ultrasoudn

## 2013-05-13 NOTE — Assessment & Plan Note (Signed)
S/p craniotomy years ago with serial MRIs done at [facility]   MRI needed

## 2013-05-13 NOTE — Assessment & Plan Note (Signed)
B12, folate and thyroid function normal

## 2013-05-13 NOTE — Assessment & Plan Note (Signed)
Unclear whether this is due to dementia, proximal muscle weakness or recurrent meningioma.  MRI, rolling walker,  Neurology evaluation

## 2013-05-13 NOTE — Progress Notes (Signed)
Patient ID: Derrick Sullivan, male   DOB: 12-04-1923, 79 y.o.   MRN: [id number]  Patient Active Problem List   Diagnosis Date Noted  . Unsteady gait 05/13/2013  . Rash and nonspecific skin eruption 03/16/2013  . Constipation - functional 10/02/2012  . Mitral insufficiency 05/11/2012  . Other and unspecified hyperlipidemia 03/27/2012  . Neuropathy of foot 11/27/2011  . Duodenal ulcer from aspirin/ibuprofen-like drugs (NSAID's) 09/26/2011  . Hypertension 09/26/2011  . Diabetes mellitus with peripheral artery disease 09/26/2011  . Short-term memory loss 08/23/2011  . Benign prostatic hypertrophy   . Benign meningioma   . Osteopenia   . Carotid stenosis, left   . Diverticulosis of colon     Subjective:  CC:   Chief Complaint  Patient presents with  . Follow-up    fell 1 week ago Sunday/ not sure why he fell may have passed out stated by patient.    HPI:   Derrick Sullivan a 79 y.o. male who presents after he had Had a fall a week ago  At home, with loss of  bowel and bladder control,  .  No warning symptoms .   Fall was witnessed by wife. Not sure if he lost consciousness.  Has been having loss of balance, gait not real steady  Not using a walker.,  Had another near fall recently outside and thinks his meningioma may have returned.  Last MRI was over a year ago.  No headaches or dizzy spells .  No nocturnal episodes of bowel or bladder incontinence. Cognitive deficits over the past year have become a little more pronounced.       Past Medical History  Diagnosis Date  . CAD (coronary artery disease)   . Peripheral vascular disease   . Degenerative disk disease   . Presbyacusis   . Osteoporosis   . Duodenal ulcer 07/2004    NSAID induced  . Hyperlipidemia   . Benign prostatic hypertrophy   . Benign meningioma 2006    s/p parietal resection  . Osteopenia   . Carotid artery stenosis, asymptomatic 2008    hemodynamically insignificant  . Diverticulosis of colon 2007  .  Hypertension 09/26/2011    Past Surgical History  Procedure Laterality Date  . Shoulder surgery  2000    rotator cuff  . Gallbladder surgery  2000  . Brain surgery      meningioma resection       The following portions of the patient's history were reviewed and updated as appropriate: Allergies, current medications, and problem list.    Review of Systems:   Patient denies headache, fevers, malaise, unintentional weight loss, skin rash, eye pain, sinus congestion and sinus pain, sore throat, dysphagia,  hemoptysis , cough, dyspnea, wheezing, chest pain, palpitations, orthopnea, edema, abdominal pain, nausea, melena, diarrhea, constipation, flank pain, dysuria, hematuria, urinary  Frequency, nocturia, numbness, tingling, seizures,  Focal weakness, Loss of consciousness,  Tremor, insomnia, depression, anxiety, and suicidal ideation.     History   Social History  . Marital Status: Married    Spouse Name: N/A    Number of Children: N/A  . Years of Education: 16   Occupational History  .     Social History Main Topics  . Smoking status: Never Smoker   . Smokeless tobacco: Former Neurosurgeon    Quit date: 07/05/1956  . Alcohol Use: No  . Drug Use: No  . Sexual Activity: No   Other Topics Concern  . Not on file   Social  History Narrative  . No narrative on file    Objective:  Filed Vitals:   05/13/13 1444  BP: 172/78  Pulse: 71  Temp: 97.5 F (36.4 C)  Resp: 14     General appearance: alert, cooperative and appears stated age Ears: normal TM's and external ear canals both ears Throat: lips, mucosa, and tongue normal; teeth and gums normal Neck: no adenopathy, no carotid bruit, supple, symmetrical, trachea midline and thyroid not enlarged, symmetric, no tenderness/mass/nodules Back: symmetric, no curvature. ROM normal. No CVA tenderness. Lungs: clear to auscultation bilaterally Heart: regular rate and rhythm, S1, S2 normal, no murmur, click, rub or gallop Abdomen:  soft, non-tender; bowel sounds normal; no masses,  no organomegaly Pulses: 2+ and symmetric Skin: Skin color, texture, turgor normal. No rashes or lesions Lymph nodes: Cervical, supraclavicular, and axillary nodes normal. Neuro: grossly nonfocal,  Cerebellar function intact.  Negative pronator drift, negative Romberg.  Gait narow based,  antalgic  Assessment and Plan:  Carotid stenosis, left Given his recent syncopal episode, will repeat ultrasoudn   Benign meningioma S/p craniotomy years ago with serial MRIs done at [facility]   MRI needed   Short-term memory loss B12, folate and thyroid function were checked last year and normal   Unsteady gait Unclear whether this is due to dementia, proximal muscle weakness or recurrent meningioma.  MRI, rolling walker,  Neurology evaluation  A total of 40 minutes was spent with patient more than half of which was spent in counseling, reviewing records from [facility] and coordination of care.  Updated Medication List Outpatient Encounter Prescriptions as of 05/13/2013  Medication Sig Dispense Refill  . alendronate (FOSAMAX) 70 MG tablet Take 1 tablet (70 mg total) by mouth every 7 (seven) days. Take with a full glass of water on an empty stomach.  21 tablet  3  . aspirin 81 MG tablet Take 81 mg by mouth daily.        Marland Kitchen atorvastatin (LIPITOR) 10 MG tablet Take 1 tablet (10 mg total) by mouth daily.  90 tablet  3  . calcium carbonate (OS-CAL) 600 MG TABS Take 600 mg by mouth daily.      . ciclopirox (LOPROX) 0.77 % cream Apply topically as needed.        . clopidogrel (PLAVIX) 75 MG tablet Take 1 tablet (75 mg total) by mouth daily.  90 tablet  3  . finasteride (PROSCAR) 5 MG tablet Take 1 tablet (5 mg total) by mouth daily.  90 tablet  4  . fluocinonide (LIDEX) 0.05 % cream Apply 1 application topically as needed.        . hydrocortisone (WESTCORT) 0.2 % cream Apply 1 application topically as needed.        . Multiple Vitamins-Minerals  (PRESERVISION AREDS PO) Take by mouth.        . terazosin (HYTRIN) 2 MG capsule Take 1 capsule (2 mg total) by mouth at bedtime.  90 capsule  3  . traMADol (ULTRAM) 50 MG tablet Take 50 mg by mouth as needed.      . Lactulose 20 GM/30ML SOLN Take 30 mLs (20 g total) by mouth every 6 (six) hours as needed. To relieve constipation  240 mL  1  . metoprolol tartrate (LOPRESSOR) 25 MG tablet Take 1 tablet (25 mg total) by mouth 2 (two) times daily.  180 tablet  3  . nystatin ointment (MYCOSTATIN) Apply topically 2 (two) times daily.  30 g  1   No facility-administered  encounter medications on file as of 05/13/2013.     Orders Placed This Encounter  Procedures  . MR Brain Wo Contrast  . Ambulatory referral to Vascular Surgery  . EKG 12-Lead    No Follow-up on file.

## 2013-05-14 ENCOUNTER — Other Ambulatory Visit: Payer: Self-pay | Admitting: *Deleted

## 2013-05-14 LAB — HM DIABETES EYE EXAM: HM Diabetic Eye Exam: NORMAL

## 2013-05-15 ENCOUNTER — Encounter: Payer: Self-pay | Admitting: Emergency Medicine

## 2013-05-15 MED ORDER — ATORVASTATIN CALCIUM 10 MG PO TABS: 10.0000 mg | ORAL_TABLET | Freq: Every day | ORAL | Status: DC

## 2013-05-15 MED ORDER — METOPROLOL TARTRATE 25 MG PO TABS: 25.0000 mg | ORAL_TABLET | Freq: Two times a day (BID) | ORAL | Status: DC

## 2013-05-22 ENCOUNTER — Ambulatory Visit: Payer: Self-pay | Admitting: Internal Medicine

## 2013-05-23 ENCOUNTER — Telehealth: Payer: Self-pay | Admitting: Internal Medicine

## 2013-05-23 DIAGNOSIS — R2681 Unsteadiness on feet: Secondary | ICD-10-CM

## 2013-05-23 DIAGNOSIS — D329 Benign neoplasm of meninges, unspecified: Secondary | ICD-10-CM

## 2013-05-23 DIAGNOSIS — R413 Other amnesia: Secondary | ICD-10-CM

## 2013-05-23 NOTE — Telephone Encounter (Signed)
MRI  Sept 2014. MRI is unchanged and there has been no recurrence of meningioma. There is generalized atrophy of the brain but nothing unusual for his age.

## 2013-05-23 NOTE — Assessment & Plan Note (Signed)
MRI  Sept 2014. MRI is unchanged and there has been no recurrence of meningioma. There is generalized atrophy of the brain but nothing unusual for his age.   

## 2013-05-23 NOTE — Assessment & Plan Note (Signed)
MRI is unchanged and there has been no recurrence of meningioma. There is generalized atrophy of the brain but nothing unusual for his age.

## 2013-05-28 NOTE — Telephone Encounter (Signed)
Detailed message left per DPR on patient voicemail.

## 2013-06-04 ENCOUNTER — Encounter: Payer: Self-pay | Admitting: Neurology

## 2013-06-05 ENCOUNTER — Emergency Department: Payer: Self-pay | Admitting: Internal Medicine

## 2013-06-05 ENCOUNTER — Encounter: Payer: Self-pay | Admitting: Neurology

## 2013-06-05 LAB — COMPREHENSIVE METABOLIC PANEL
Albumin: 3.9 g/dL (ref 3.4–5.0)
Bilirubin,Total: 1.2 mg/dL — ABNORMAL HIGH (ref 0.2–1.0)
Chloride: 106 mmol/L (ref 98–107)
Co2: 26 mmol/L (ref 21–32)
EGFR (African American): >60
Glucose: 122 mg/dL — ABNORMAL HIGH (ref 65–99)
Osmolality: 278 (ref 275–301)
Potassium: 4.5 mmol/L (ref 3.5–5.1)
SGPT (ALT): 24 U/L (ref 12–78)
Sodium: 137 mmol/L (ref 136–145)
Total Protein: 7.3 g/dL (ref 6.4–8.2)

## 2013-06-05 LAB — CBC
HCT: 37.2 % — ABNORMAL LOW (ref 40.0–52.0)
HGB: 13.2 g/dL (ref 13.0–18.0)
MCH: 32.1 pg (ref 26.0–34.0)
MCHC: 35.5 g/dL (ref 32.0–36.0)
MCV: 91 fL (ref 80–100)
Platelet: 172 10*3/uL (ref 150–440)
RBC: 4.11 10*6/uL — ABNORMAL LOW (ref 4.40–5.90)
RDW: 13.4 % (ref 11.5–14.5)
WBC: 12.8 10*3/uL — ABNORMAL HIGH (ref 3.8–10.6)

## 2013-06-05 LAB — URINALYSIS, COMPLETE
Bacteria: NONE SEEN
Bilirubin,UR: NEGATIVE
Blood: NEGATIVE
Glucose,UR: NEGATIVE mg/dL (ref 0–75)
Ketone: NEGATIVE
Leukocyte Esterase: NEGATIVE
Nitrite: NEGATIVE
Ph: 8 (ref 4.5–8.0)
Protein: 30
RBC,UR: 1 /HPF (ref 0–5)
Specific Gravity: 1.012 (ref 1.003–1.030)
Squamous Epithelial: <1
WBC UR: 1 /HPF (ref 0–5)

## 2013-06-05 LAB — CK TOTAL AND CKMB (NOT AT ARMC)
CK, Total: 351 U/L — ABNORMAL HIGH (ref 35–232)
CK-MB: 5.7 ng/mL — ABNORMAL HIGH (ref 0.5–3.6)

## 2013-06-05 LAB — TROPONIN I: Troponin-I: <0.02 ng/mL

## 2013-06-06 ENCOUNTER — Encounter: Payer: Self-pay | Admitting: Internal Medicine

## 2013-06-07 ENCOUNTER — Emergency Department: Payer: Self-pay | Admitting: Emergency Medicine

## 2013-06-10 ENCOUNTER — Ambulatory Visit: Payer: 59 | Admitting: Adult Health

## 2013-06-10 LAB — COMPREHENSIVE METABOLIC PANEL
Albumin: 3.8 g/dL (ref 3.4–5.0)
Bilirubin,Total: 1.1 mg/dL — ABNORMAL HIGH (ref 0.2–1.0)
Calcium, Total: 9.9 mg/dL (ref 8.5–10.1)
Co2: 26 mmol/L (ref 21–32)
Creatinine: 2.21 mg/dL — ABNORMAL HIGH (ref 0.60–1.30)
EGFR (African American): 30 — ABNORMAL LOW
EGFR (Non-African Amer.): 26 — ABNORMAL LOW
Glucose: 107 mg/dL — ABNORMAL HIGH (ref 65–99)
Osmolality: 285 (ref 275–301)
Potassium: 4.1 mmol/L (ref 3.5–5.1)
Sodium: 137 mmol/L (ref 136–145)

## 2013-06-10 LAB — URINALYSIS, COMPLETE
Bacteria: NONE SEEN
Bilirubin,UR: NEGATIVE
Blood: NEGATIVE
Hyaline Cast: 6
Ketone: NEGATIVE
Leukocyte Esterase: NEGATIVE
Nitrite: NEGATIVE
RBC,UR: NONE SEEN /HPF (ref 0–5)
Squamous Epithelial: <1
WBC UR: 13 /HPF (ref 0–5)

## 2013-06-10 LAB — CK TOTAL AND CKMB (NOT AT ARMC)
CK, Total: 141 U/L (ref 35–232)
CK-MB: 4.3 ng/mL — ABNORMAL HIGH (ref 0.5–3.6)

## 2013-06-10 LAB — ETHANOL
Ethanol %: <0.003 % (ref 0.000–0.080)
Ethanol: <3 mg/dL

## 2013-06-10 LAB — CBC
MCH: 32 pg (ref 26.0–34.0)
MCV: 91 fL (ref 80–100)
RBC: 3.98 10*6/uL — ABNORMAL LOW (ref 4.40–5.90)
RDW: 13.6 % (ref 11.5–14.5)

## 2013-06-11 ENCOUNTER — Inpatient Hospital Stay: Payer: Self-pay | Admitting: Internal Medicine

## 2013-06-12 LAB — BASIC METABOLIC PANEL
Calcium, Total: 8.9 mg/dL (ref 8.5–10.1)
Chloride: 108 mmol/L — ABNORMAL HIGH (ref 98–107)
Creatinine: 1.07 mg/dL (ref 0.60–1.30)
EGFR (African American): >60
Potassium: 3.5 mmol/L (ref 3.5–5.1)

## 2013-06-12 LAB — URINE CULTURE

## 2013-06-14 ENCOUNTER — Telehealth: Payer: Self-pay | Admitting: Internal Medicine

## 2013-06-14 NOTE — Telephone Encounter (Signed)
Patients wife called in requesting that we send a medication list to son Jordy Verba at [address] [place], [place] [address]. I did ask her to verify his date of birth and address. Please print medication list and I will mail.

## 2013-06-14 NOTE — Telephone Encounter (Signed)
Med list printed and mailed.  

## 2013-06-17 ENCOUNTER — Telehealth: Payer: Self-pay | Admitting: Internal Medicine

## 2013-06-17 NOTE — Telephone Encounter (Signed)
States pt is at [facility].  Is it possible to give them a list of all other doctors he sees.  Pt has dementia and is having trouble remembering. States he has a Neurology appt coming soon. Contact Jamicah Anstead [phone] (daughter-in-law)

## 2013-06-25 NOTE — Telephone Encounter (Signed)
NO DPR to give infornmation to daughter- in - law signed, daughter stated that they will be filing form for  Power Attorney and will fax copy as soon as completed.

## 2013-07-04 ENCOUNTER — Encounter: Payer: Self-pay | Admitting: Internal Medicine

## 2013-07-04 ENCOUNTER — Telehealth: Payer: Self-pay | Admitting: Internal Medicine

## 2013-07-04 ENCOUNTER — Ambulatory Visit (INDEPENDENT_AMBULATORY_CARE_PROVIDER_SITE_OTHER): Payer: 59 | Admitting: Internal Medicine

## 2013-07-04 VITALS — BP 120/56 | HR 89 | Temp 98.2°F | Resp 12 | Wt 169.0 lb

## 2013-07-04 DIAGNOSIS — R4189 Other symptoms and signs involving cognitive functions and awareness: Secondary | ICD-10-CM

## 2013-07-04 DIAGNOSIS — R269 Unspecified abnormalities of gait and mobility: Secondary | ICD-10-CM

## 2013-07-04 DIAGNOSIS — R413 Other amnesia: Secondary | ICD-10-CM

## 2013-07-04 DIAGNOSIS — I10 Essential (primary) hypertension: Secondary | ICD-10-CM

## 2013-07-04 DIAGNOSIS — D649 Anemia, unspecified: Secondary | ICD-10-CM

## 2013-07-04 DIAGNOSIS — R2681 Unsteadiness on feet: Secondary | ICD-10-CM

## 2013-07-04 DIAGNOSIS — N179 Acute kidney failure, unspecified: Secondary | ICD-10-CM

## 2013-07-04 LAB — COMPREHENSIVE METABOLIC PANEL WITH GFR
ALT: 37 U/L (ref 0–53)
AST: 25 U/L (ref 0–37)
Albumin: 3.6 g/dL (ref 3.5–5.2)
Alkaline Phosphatase: 104 U/L (ref 39–117)
BUN: 21 mg/dL (ref 6–23)
CO2: 25 meq/L (ref 19–32)
Calcium: 9.4 mg/dL (ref 8.4–10.5)
Chloride: 104 meq/L (ref 96–112)
Creatinine, Ser: 0.9 mg/dL (ref 0.4–1.5)
GFR: 84.82 mL/min (ref >60.00)
Glucose, Bld: 153 mg/dL — ABNORMAL HIGH (ref 70–99)
Potassium: 4 meq/L (ref 3.5–5.1)
Sodium: 139 meq/L (ref 135–145)
Total Bilirubin: 1.1 mg/dL (ref 0.3–1.2)
Total Protein: 6.6 g/dL (ref 6.0–8.3)

## 2013-07-04 LAB — CBC WITH DIFFERENTIAL/PLATELET
Basophils Relative: 0.4 % (ref 0.0–3.0)
Eosinophils Absolute: 0.2 10*3/uL (ref 0.0–0.7)
Eosinophils Relative: 2.6 % (ref 0.0–5.0)
Lymphs Abs: 1.3 10*3/uL (ref 0.7–4.0)
MCHC: 33.8 g/dL (ref 30.0–36.0)
MCV: 92.4 fl (ref 78.0–100.0)
Monocytes Absolute: 0.6 10*3/uL (ref 0.1–1.0)
Neutrophils Relative %: 73.8 % (ref 43.0–77.0)
Platelets: 282 10*3/uL (ref 150.0–400.0)
RBC: 3.83 Mil/uL — ABNORMAL LOW (ref 4.22–5.81)
RDW: 13.5 % (ref 11.5–14.6)

## 2013-07-04 LAB — VITAMIN B12: Vitamin B-12: 621 pg/mL (ref 211–911)

## 2013-07-04 LAB — POCT URINALYSIS DIPSTICK
Bilirubin, UA: NEGATIVE
Blood, UA: NEGATIVE
Leukocytes, UA: NEGATIVE
Protein, UA: 100
Spec Grav, UA: 1.02
Urobilinogen, UA: 1
pH, UA: 6

## 2013-07-04 LAB — TSH: TSH: 0.92 u[IU]/mL (ref 0.35–5.50)

## 2013-07-04 MED ORDER — LORAZEPAM 0.5 MG PO TABS: 0.5000 mg | ORAL_TABLET | Freq: Every evening | ORAL | Status: DC | PRN

## 2013-07-04 NOTE — Telephone Encounter (Signed)
I do not see the UA pending or collected

## 2013-07-04 NOTE — Progress Notes (Signed)
Patient ID: Derrick Sullivan, male   DOB: 1923/09/13, 79 y.o.   MRN: [id number]  Patient Active Problem List   Diagnosis Date Noted  . Unsteady gait 05/13/2013  . Rash and nonspecific skin eruption 03/16/2013  . Constipation - functional 10/02/2012  . Mitral insufficiency 05/11/2012  . Other and unspecified hyperlipidemia 03/27/2012  . Neuropathy of foot 11/27/2011  . Duodenal ulcer from aspirin/ibuprofen-like drugs (NSAID's) 09/26/2011  . Hypertension 09/26/2011  . Diabetes mellitus with peripheral artery disease 09/26/2011  . Dementia with behavioral disturbance 08/23/2011  . Benign prostatic hypertrophy   . Benign meningioma   . Osteopenia   . Carotid stenosis, left   . Diverticulosis of colon     Subjective:  CC:   Chief Complaint  Patient presents with  . Acute Visit    right side pain right below rib cage    HPI:   Derrick Sullivan a 79 y.o. male who presents for follow up after hospitalization and rehab stay.   Summary: Patient was admitted to [facility] and stayed from Oct 7 to Oct 9 followed by 2 weeks of rehab at [facility] for generalized weakness and frequent falls attributed to dehydration.  Chest x rays and rib films were negative for right sided rib fractures Patient was given IV fluids for correction of hyponatremia. He was evaluated by Neurology in house for confusion attributed to early dementia and has outpatient follow up scheduled.  His hypertension was not controlled and amlodipine was added ,  He was noted to have significant confusion and sundowning.  Low dose remeron was added .  He is accompanied by wife Derrick Sullivan and paid caregiver Derrick Sullivan ([employer]) .  He and caregiver are confused about his medication regimen.  It appears that he has not been taking the amlodipine and has received multiple refills on vicodin from different providers for management of right sided rib pain,  He has not had any in 2 days. Pain is tolerable. Caregiver has noticed  recurrent late afternoon confusion and agitation.  Requesting medication, for omanagement of symptons .   remeron is being given at night ,. .   Had a fall on a Sunday morning, with loss of  bowel and bladder control,  .  No warning symptoms .   Fall was witnessed by wife. Not sure if he lost consciousness.  Has been having loss of balance, gait not real steady  Not using a walker.,  Had another near fall recently outside and thinks his meningioma may have returned.  Last MRI was over a year ago.  No headaches or dizzy spells .  No nocturnal episodes of bowel or bladder incontinence. Cognitive deficits over the past year have become a little more pronounced.        Past Medical History  Diagnosis Date  . CAD (coronary artery disease)   . Peripheral vascular disease   . Degenerative disk disease   . Presbyacusis   . Osteoporosis   . Duodenal ulcer 07/2004    NSAID induced  . Hyperlipidemia   . Benign prostatic hypertrophy   . Benign meningioma 2006    s/p parietal resection  . Osteopenia   . Carotid artery stenosis, asymptomatic 2008    hemodynamically insignificant  . Diverticulosis of colon 2007  . Hypertension 09/26/2011    Past Surgical History  Procedure Laterality Date  . Shoulder surgery  2000    rotator cuff  . Gallbladder surgery  2000  . Brain surgery  meningioma resection       The following portions of the patient's history were reviewed and updated as appropriate: Allergies, current medications, and problem list.    Review of Systems:   Patient denies headache, fevers, malaise, unintentional weight loss, skin rash, eye pain, sinus congestion and sinus pain, sore throat, dysphagia,  hemoptysis , cough, dyspnea, wheezing,  palpitations, orthopnea, edema, abdominal pain, nausea, melena, diarrhea, constipation, flank pain, dysuria, hematuria, urinary  Frequency, nocturia, numbness, tingling, seizures,  Focal weakness, Loss of consciousness,  Tremor, insomnia,  depression, anxiety, and suicidal ideation.       History   Social History  . Marital Status: Married    Spouse Name: N/A    Number of Children: N/A  . Years of Education: 16   Occupational History  .     Social History Main Topics  . Smoking status: Never Smoker   . Smokeless tobacco: Former Neurosurgeon    Quit date: 07/05/1956  . Alcohol Use: No  . Drug Use: No  . Sexual Activity: No   Other Topics Concern  . Not on file   Social History Narrative  . No narrative on file    Objective:  Filed Vitals:   07/04/13 1035  BP: 120/56  Pulse: 89  Temp: 98.2 F (36.8 C)  Resp: 12     General appearance: alert, cooperative and appears stated age Ears: normal TM's and external ear canals both ears Throat: lips, mucosa, and tongue normal; teeth and gums normal Neck: no adenopathy, no carotid bruit, supple, symmetrical, trachea midline and thyroid not enlarged, symmetric, no tenderness/mass/nodules Back: symmetric, no curvature. ROM normal. No CVA tenderness.no rib tenderness.  Lungs: clear to auscultation bilaterally Heart: regular rate and rhythm, S1, S2 normal, no murmur, click, rub or gallop Abdomen: soft, non-tender; bowel sounds normal; no masses,  no organomegaly Pulses: 2+ and symmetric Skin: Skin color, texture, turgor normal. No rashes or lesions Lymph nodes: Cervical, supraclavicular, and axillary nodes normal.  Assessment and Plan:  Dementia with behavioral disturbance MRI  Sept 2014. MRI is unchanged and there has been no recurrence of meningioma. There is generalized atrophy of the brain but nothing unusual for his age. His gait has been unsteady and he has had several falls, the last one resultigi n hospitalization followed by rehab.  Follow up with Dr Malvin Johns,  Neurology.  Home health evaluation ordered.  Lorazepam 0.5 mg prn agitation. Having caregivers manage his medication    Hypertension Well controlled on current regimen without amlodipine . Renal  function stable, no changes today.  Unsteady gait No evidence of NPH by recent neurology evaluation.,.  Home PT ordered.   A total of 40 minutes of face to face time was spent with patient more than half of which was spent in counselling and coordination of care    Updated Medication List Outpatient Encounter Prescriptions as of 07/04/2013  Medication Sig  . aspirin 81 MG tablet Take 81 mg by mouth daily.    . calcium carbonate (OS-CAL) 600 MG TABS Take 600 mg by mouth daily.  . hydrocortisone (WESTCORT) 0.2 % cream Apply 1 application topically as needed.    . nystatin ointment (MYCOSTATIN) Apply topically 2 (two) times daily.  . ciclopirox (LOPROX) 0.77 % cream Apply topically as needed.    . fluocinonide (LIDEX) 0.05 % cream Apply 1 application topically as needed.    . Lactulose 20 GM/30ML SOLN Take 30 mLs (20 g total) by mouth every 6 (six) hours as needed.  To relieve constipation  . LORazepam (ATIVAN) 0.5 MG tablet Take 1 tablet (0.5 mg total) by mouth at bedtime and may repeat dose one time if needed.  . mirtazapine (REMERON) 15 MG tablet Take 15 mg by mouth at bedtime.  . Multiple Vitamins-Minerals (PRESERVISION AREDS PO) Take by mouth.    . [DISCONTINUED] alendronate (FOSAMAX) 70 MG tablet Take 1 tablet (70 mg total) by mouth every 7 (seven) days. Take with a full glass of water on an empty stomach.  . [DISCONTINUED] amLODipine (NORVASC) 5 MG tablet Take 5 mg by mouth daily.  . [DISCONTINUED] atorvastatin (LIPITOR) 10 MG tablet Take 1 tablet (10 mg total) by mouth daily.  . [DISCONTINUED] clopidogrel (PLAVIX) 75 MG tablet Take 1 tablet (75 mg total) by mouth daily.  . [DISCONTINUED] finasteride (PROSCAR) 5 MG tablet Take 1 tablet (5 mg total) by mouth daily.  . [DISCONTINUED] HYDROcodone-acetaminophen (NORCO/VICODIN) 5-325 MG per tablet Take 1 tablet by mouth every 6 (six) hours as needed for pain.  . [DISCONTINUED] metoprolol tartrate (LOPRESSOR) 25 MG tablet Take 1 tablet (25 mg  total) by mouth 2 (two) times daily.  . [DISCONTINUED] terazosin (HYTRIN) 2 MG capsule Take 1 capsule (2 mg total) by mouth at bedtime.  . [DISCONTINUED] traMADol (ULTRAM) 50 MG tablet Take 50 mg by mouth as needed.     Orders Placed This Encounter  Procedures  . CBC with Differential  . Comp Met (CMET)  . Vitamin B12  . TSH  . POCT urinalysis dipstick    No Follow-up on file.

## 2013-07-04 NOTE — Patient Instructions (Addendum)
  You do not have a rib fracture.  You strained some muscles and bruised your ribs. You can take 2 tylenol and 2 motrin twice daily for your pain   You do not need any blood pressure medications  You need to take the mirtazipine at bedtime to help you rest  You can take lorazepam if needed for agitation   You need to see Dr Malvin Johns ,  The neurologist for your memory problems   Your appt with him is November 25th at 2:15 at the [facility] clinic   phone [phone]  If you have any trouble passing your water we will resume your prostate medications

## 2013-07-04 NOTE — Telephone Encounter (Signed)
Derrick Sullivan said its in .

## 2013-07-06 ENCOUNTER — Encounter: Payer: Self-pay | Admitting: Neurology

## 2013-07-06 ENCOUNTER — Encounter: Payer: Self-pay | Admitting: Internal Medicine

## 2013-07-06 NOTE — Assessment & Plan Note (Addendum)
MRI  Sept 2014. MRI is unchanged and there has been no recurrence of meningioma. There is generalized atrophy of the brain but nothing unusual for his age. His gait has been unsteady and he has had several falls, the last one resultigi n hospitalization followed by rehab.  Follow up with Dr Malvin Johns,  Neurology.  Home health evaluation ordered.  Lorazepam 0.5 mg prn agitation. Having caregivers manage his medication

## 2013-07-06 NOTE — Assessment & Plan Note (Signed)
Well controlled on current regimen without amlodipine . Renal function stable, no changes today.

## 2013-07-06 NOTE — Assessment & Plan Note (Signed)
No evidence of NPH by recent neurology evaluation.,.  Home PT ordered.

## 2013-07-09 ENCOUNTER — Ambulatory Visit: Payer: 59 | Admitting: Internal Medicine

## 2013-08-06 ENCOUNTER — Telehealth: Payer: Self-pay | Admitting: *Deleted

## 2013-08-06 NOTE — Telephone Encounter (Signed)
Family is requesting release of medical records for long term care I have nothing stating we can release signed by patient advised we would need this prior to release of information. FYI letter in red folder Explains I am Placing release up front for patient to sign 

## 2013-08-30 ENCOUNTER — Other Ambulatory Visit: Payer: Self-pay | Admitting: Internal Medicine

## 2013-08-30 NOTE — Telephone Encounter (Signed)
Refill? Last OV and refill was on 07/04/2013 #60

## 2013-09-16 ENCOUNTER — Ambulatory Visit: Payer: 59 | Admitting: Internal Medicine

## 2013-09-27 ENCOUNTER — Other Ambulatory Visit: Payer: Self-pay | Admitting: Adult Health

## 2013-09-27 NOTE — Telephone Encounter (Signed)
Ok refill? Last OV 07/04/13

## 2013-09-30 NOTE — Telephone Encounter (Signed)
RX faxed

## 2013-09-30 NOTE — Telephone Encounter (Signed)
Ok to refill,  printed rx  

## 2013-10-08 ENCOUNTER — Telehealth: Payer: Self-pay | Admitting: Internal Medicine

## 2013-10-08 NOTE — Telephone Encounter (Signed)
The patient is wanting a larger tube of this cream . Does it come in a larger size ? If it does not please let the patient know before calling it into the pharmacy.   nystatin ointment (MYCOSTATIN)

## 2013-10-09 ENCOUNTER — Telehealth: Payer: Self-pay | Admitting: Internal Medicine

## 2013-10-09 NOTE — Telephone Encounter (Signed)
Pt came in today wanting to know if he could get a refill on nystatin 100000 u/gm topical ont gm Apply twice daily  Pt wanted to know if they make this is a bigger tube On the tube is says 15g (0.53oz) [place]  Please advise

## 2013-10-10 MED ORDER — NYSTATIN 100000 UNIT/GM EX OINT: TOPICAL_OINTMENT | Freq: Two times a day (BID) | CUTANEOUS | Status: DC

## 2013-10-10 NOTE — Telephone Encounter (Signed)
Script sent and 30G tube.

## 2013-10-11 ENCOUNTER — Ambulatory Visit: Payer: 59 | Admitting: Internal Medicine

## 2013-10-14 ENCOUNTER — Telehealth: Payer: Self-pay | Admitting: Internal Medicine

## 2013-10-14 ENCOUNTER — Encounter: Payer: Self-pay | Admitting: Internal Medicine

## 2013-10-14 ENCOUNTER — Ambulatory Visit (INDEPENDENT_AMBULATORY_CARE_PROVIDER_SITE_OTHER): Payer: 59 | Admitting: Internal Medicine

## 2013-10-14 VITALS — BP 160/76 | HR 69 | Temp 97.8°F | Resp 16 | Wt 171.2 lb

## 2013-10-14 DIAGNOSIS — E1159 Type 2 diabetes mellitus with other circulatory complications: Secondary | ICD-10-CM

## 2013-10-14 DIAGNOSIS — F03918 Unspecified dementia, unspecified severity, with other behavioral disturbance: Secondary | ICD-10-CM

## 2013-10-14 DIAGNOSIS — E1151 Type 2 diabetes mellitus with diabetic peripheral angiopathy without gangrene: Secondary | ICD-10-CM

## 2013-10-14 DIAGNOSIS — L270 Generalized skin eruption due to drugs and medicaments taken internally: Secondary | ICD-10-CM

## 2013-10-14 DIAGNOSIS — I798 Other disorders of arteries, arterioles and capillaries in diseases classified elsewhere: Secondary | ICD-10-CM

## 2013-10-14 DIAGNOSIS — F0391 Unspecified dementia with behavioral disturbance: Secondary | ICD-10-CM

## 2013-10-14 LAB — HM DIABETES FOOT EXAM

## 2013-10-14 NOTE — Patient Instructions (Signed)
Stop the remeron (mirtazipine)  No new medications for two weeks to see if rash resolves with stopping the mirtazipine .  Return in two weeks to see if rash resolves  You can use generic benadryl 25 mg every 8 hours for itching . If no help or too sedating,  We will use a prednisone taper

## 2013-10-14 NOTE — Assessment & Plan Note (Signed)
Suspected secondary to mirtazipine .  Will stop medication and reassess in 2 weeks

## 2013-10-14 NOTE — Assessment & Plan Note (Signed)
Well-controlled on diet alone .  hemoglobin A1c has been consistently less than 7.0 . Patient is up-to-date on eye exams and foot examThere is  no proteinuria on today's micro urinalysis .  Fasting lipids have been reviewed and statin therapy has been advised and updated according to new ACC guidelines based on patient's 10 year risk of CAD

## 2013-10-14 NOTE — Assessment & Plan Note (Signed)
We discussed starting Aricept as a trial .  Risks and benefits discussed.  Will resolve rash first.

## 2013-10-14 NOTE — Progress Notes (Signed)
Pre-visit discussion using our clinic review tool. No additional management support is needed unless otherwise documented below in the visit note.  

## 2013-10-14 NOTE — Telephone Encounter (Signed)
In reviewing his records,  He has not had an a1c or fasting lipids since July.  Can he return for labs?  And when was his last eye visit.?

## 2013-10-14 NOTE — Progress Notes (Addendum)
Patient ID: Derrick Sullivan, male   DOB: Nov 29, 1923, 80 y.o.   MRN: [id number]     Patient Active Problem List   Diagnosis Date Noted  . Unsteady gait 05/13/2013  . Rash, drug 03/16/2013  . Constipation - functional 10/02/2012  . Mitral insufficiency 05/11/2012  . Other and unspecified hyperlipidemia 03/27/2012  . Neuropathy of foot 11/27/2011  . Duodenal ulcer from aspirin/ibuprofen-like drugs (NSAID's) 09/26/2011  . Hypertension 09/26/2011  . Diabetes mellitus with peripheral artery disease 09/26/2011  . Mild cognitive impairment 08/23/2011  . Benign prostatic hypertrophy   . Benign meningioma   . Osteopenia   . Carotid stenosis, left   . Diverticulosis of colon     Subjective:  CC:   Chief Complaint  Patient presents with  . Follow-up    memory problems    HPI:   Derrick Sullivan is a 80 y.o. male who presents for Follow up on memory problems with prior woorkup including MRI brain, carotid dopplers and Neurology evaluation,  Patient is no longer driving and has a  caregiver from 8 am to 9 PM. Caregiver is present today and has no idea what medications he is taking because it is her fist day on the job.    Patient denies any progression of disease but cannot remember his medications.  He is more concerned about persistent urticarial rash that started during rehab . Rash started on his trunk and has spread to legs.   MMSE :  President?  2015  Feb Monday  Calculation:  Nickels in 1.25  ? 25    Memory 0/3  Animals; 111111 11111  111 111  F  11111  111    Past Medical History  Diagnosis Date  . CAD (coronary artery disease)   . Peripheral vascular disease   . Degenerative disk disease   . Presbyacusis   . Osteoporosis   . Duodenal ulcer 07/2004    NSAID induced  . Hyperlipidemia   . Benign prostatic hypertrophy   . Benign meningioma 2006    s/p parietal resection  . Osteopenia   . Carotid artery stenosis, asymptomatic 2008    hemodynamically  insignificant  . Diverticulosis of colon 2007  . Hypertension 09/26/2011    Past Surgical History  Procedure Laterality Date  . Shoulder surgery  2000    rotator cuff  . Gallbladder surgery  2000  . Brain surgery      meningioma resection       The following portions of the patient's history were reviewed and updated as appropriate: Allergies, current medications, and problem list.    Review of Systems:   Patient denies headache, fevers, malaise, unintentional weight loss, skin rash, eye pain, sinus congestion and sinus pain, sore throat, dysphagia,  hemoptysis , cough, dyspnea, wheezing, chest pain, palpitations, orthopnea, edema, abdominal pain, nausea, melena, diarrhea, constipation, flank pain, dysuria, hematuria, urinary  Frequency, nocturia, numbness, tingling, seizures,  Focal weakness, Loss of consciousness,  Tremor, insomnia, depression, anxiety, and suicidal ideation.     History   Social History  . Marital Status: Married    Spouse Name: N/A    Number of Children: N/A  . Years of Education: 16   Occupational History  .     Social History Main Topics  . Smoking status: Never Smoker   . Smokeless tobacco: Former Systems developer    Quit date: 07/05/1956  . Alcohol Use: No  . Drug Use: No  . Sexual Activity: No   Other Topics  Concern  . Not on file   Social History Narrative  . No narrative on file    Objective:  Filed Vitals:   10/14/13 0940  BP: 160/76  Pulse: 69  Temp: 97.8 F (36.6 C)  Resp: 16     General appearance: alert, cooperative and appears stated age Ears: normal TM's and external ear canals both ears Throat: lips, mucosa, and tongue normal; teeth and gums normal Neck: no adenopathy, no carotid bruit, supple, symmetrical, trachea midline and thyroid not enlarged, symmetric, no tenderness/mass/nodules Back: symmetric, no curvature. ROM normal. No CVA tenderness. Lungs: clear to auscultation bilaterally Heart: regular rate and rhythm, S1, S2  normal, no murmur, click, rub or gallop Abdomen: soft, non-tender; bowel sounds normal; no masses,  no organomegaly Pulses: 2+ and symmetric Skin: Skin color, texture, turgor normal. No rashes or lesions Lymph nodes: Cervical, supraclavicular, and axillary nodes normal.  Assessment and Plan:  Mild cognitive impairment MMSE score was 26/30 and he had significant difficulty naming works beginning wit "F" (7 in 60 sec). We discussed starting Aricept as a trial .  Risks and benefits discussed.  Will resolve rash first.  Rash, drug Suspected secondary to mirtazipine .  Will stop medication and reassess in 2 weeks   Diabetes mellitus with peripheral artery disease Well-controlled on diet alone .  hemoglobin A1c has been consistently less than 7.0 . Patient is up-to-date on eye exams and foot examThere is  no proteinuria on today's micro urinalysis .  Fasting lipids have been reviewed and statin therapy has been advised and updated according to new ACC guidelines based on patient's 10 year risk of CAD    Updated Medication List Outpatient Encounter Prescriptions as of 10/14/2013  Medication Sig  . aspirin 81 MG tablet Take 81 mg by mouth daily.    Marland Kitchen ketoconazole (NIZORAL) 2 % shampoo Apply 1 application topically 2 (two) times a week.  . nystatin ointment (MYCOSTATIN) Apply topically 2 (two) times daily.  . calcium carbonate (OS-CAL) 600 MG TABS Take 600 mg by mouth daily.  . ciclopirox (LOPROX) 0.77 % cream Apply topically as needed.    . fluocinonide (LIDEX) 0.05 % cream Apply 1 application topically as needed.    . hydrocortisone (WESTCORT) 0.2 % cream Apply 1 application topically as needed.    . Lactulose 20 GM/30ML SOLN Take 30 mLs (20 g total) by mouth every 6 (six) hours as needed. To relieve constipation  . LORazepam (ATIVAN) 0.5 MG tablet TAKE ONE TABLET BY MOUTH AT BEDTIME. MAY REPEAT DOSE ONE TIME IF NEEDED.  . Multiple Vitamins-Minerals (PRESERVISION AREDS PO) Take by mouth.    .  [DISCONTINUED] mirtazapine (REMERON) 15 MG tablet Take 15 mg by mouth at bedtime.     Orders Placed This Encounter  Procedures  . Hemoglobin A1c  . Microalbumin / creatinine urine ratio  . Comprehensive metabolic panel  . Lipid panel  . HM DIABETES EYE EXAM  . HM DIABETES FOOT EXAM    Return in about 2 weeks (around 10/28/2013).

## 2013-10-15 NOTE — Telephone Encounter (Signed)
Ask patient to call his eye doctor's office or give [place] his name

## 2013-10-15 NOTE — Telephone Encounter (Signed)
Talked with Derrick Sullivan and there is no way of knowing last eye exam because patient cannot recall. Lab appointment made for 10/22/13

## 2013-10-18 ENCOUNTER — Telehealth: Payer: Self-pay

## 2013-10-18 NOTE — Telephone Encounter (Signed)
Relevant patient education mailed to patient.  

## 2013-10-21 NOTE — Telephone Encounter (Signed)
Patient stated he will schedule.

## 2013-10-22 ENCOUNTER — Other Ambulatory Visit: Payer: 59

## 2013-10-22 ENCOUNTER — Encounter: Payer: Self-pay | Admitting: Internal Medicine

## 2013-10-22 DIAGNOSIS — Z0279 Encounter for issue of other medical certificate: Secondary | ICD-10-CM

## 2013-10-30 ENCOUNTER — Ambulatory Visit (INDEPENDENT_AMBULATORY_CARE_PROVIDER_SITE_OTHER): Payer: 59 | Admitting: Internal Medicine

## 2013-10-30 ENCOUNTER — Encounter: Payer: Self-pay | Admitting: Internal Medicine

## 2013-10-30 VITALS — BP 136/74 | HR 78 | Temp 97.7°F | Resp 16 | Wt 168.2 lb

## 2013-10-30 DIAGNOSIS — L270 Generalized skin eruption due to drugs and medicaments taken internally: Secondary | ICD-10-CM

## 2013-10-30 DIAGNOSIS — R2681 Unsteadiness on feet: Secondary | ICD-10-CM

## 2013-10-30 DIAGNOSIS — I798 Other disorders of arteries, arterioles and capillaries in diseases classified elsewhere: Secondary | ICD-10-CM

## 2013-10-30 DIAGNOSIS — E1151 Type 2 diabetes mellitus with diabetic peripheral angiopathy without gangrene: Secondary | ICD-10-CM

## 2013-10-30 DIAGNOSIS — R269 Unspecified abnormalities of gait and mobility: Secondary | ICD-10-CM

## 2013-10-30 DIAGNOSIS — G5790 Unspecified mononeuropathy of unspecified lower limb: Secondary | ICD-10-CM

## 2013-10-30 DIAGNOSIS — G3184 Mild cognitive impairment, so stated: Secondary | ICD-10-CM

## 2013-10-30 DIAGNOSIS — E1159 Type 2 diabetes mellitus with other circulatory complications: Secondary | ICD-10-CM

## 2013-10-30 MED ORDER — CITALOPRAM HYDROBROMIDE 10 MG PO TABS: 10.0000 mg | ORAL_TABLET | Freq: Every day | ORAL | Status: DC

## 2013-10-30 MED ORDER — DONEPEZIL HCL 5 MG PO TABS
5.0000 mg | ORAL_TABLET | Freq: Every day | ORAL | Status: DC
Start: 2013-10-30 — End: 2016-02-01

## 2013-10-30 NOTE — Assessment & Plan Note (Addendum)
MMSE was done, and his score is  23/30 today with  short-term and long-term memory deficits noted to be significant. However he had no problems with calculations,  Spatial/geomoetric and clock drawing.  Because of his memory deficits he is at risk for unsafe medication administration if unsupervised. I will continue to recommend a day time caregiver to manage meals and medications .  Starting Aricept at bedtime and low dose citalopram  caregiver's report of tearfulness and irritability since stopping mirtazapine.. Continue when necessary lorazepam for agitation.

## 2013-10-30 NOTE — Patient Instructions (Addendum)
We are starting Aricept 5 mg daily at bedtime for your memory  Problems  If it causes diarrhea ,  Let me know  I am adding citalopram (generic for celexa) for your depression and irritability  Start with 1/2 tablet daily in the ecening.  Increase to full tablet after one week  Do not take ibuprofen or tylenol unless you are having pain  Continue aspirin daily  Return in one month

## 2013-10-30 NOTE — Progress Notes (Signed)
Patient ID: Derrick Sullivan, male   DOB: November 19, 1923, 80 y.o.   MRN: [id number]  Patient Active Problem List   Diagnosis Date Noted  . Unsteady gait 05/13/2013  . Rash, drug 03/16/2013  . Constipation - functional 10/02/2012  . Mitral insufficiency 05/11/2012  . Other and unspecified hyperlipidemia 03/27/2012  . Neuropathy of foot 11/27/2011  . Duodenal ulcer from aspirin/ibuprofen-like drugs (NSAID's) 09/26/2011  . Hypertension 09/26/2011  . Diabetes mellitus with peripheral artery disease 09/26/2011  . 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 Benign prostatic hypertrophy   . Benign meningioma   . Osteopenia   . Carotid stenosis, left   . Diverticulosis of colon     Subjective:  CC:   Chief Complaint  Patient presents with  . Follow-up    2 week    HPI:   Derrick Sullivan is a 80 y.o. male who presents for  Follow up on dementia with behavioral disturbances. At last visit medication was discussed but patient was still suffering from a rash which was presumed to be due to to recent initiation of mirtazipine, which was started during his rehabilitation and stopped by me 2 weeks ago . He states that the rash has improved. His caretaker has noticed less itching and less redness to the skin.  Patient is reporting concern over persistent numbness of left lateral leg.  The numbness has not progressed and has been present for over a year. He states that his neurosurgeon warned him that this is a long term side effect of prior meningioma surgery.  Mood lability. Patient's caregiver reports that he has been more irritable and tearful since  the Remeron was stopped. She is requesting an alternative medication.     Past Medical History  Diagnosis Date  . CAD (coronary artery disease)   . Peripheral vascular disease   . Degenerative disk disease   . Presbyacusis   . Osteoporosis   . Duodenal ulcer 07/2004    NSAID induced  . Hyperlipidemia   .  Benign prostatic hypertrophy   . Benign meningioma 2006    s/p parietal resection  . Osteopenia   . Carotid artery stenosis, asymptomatic 2008    hemodynamically insignificant  . Diverticulosis of colon 2007  . Hypertension 09/26/2011    Past Surgical History  Procedure Laterality Date  . Shoulder surgery  2000    rotator cuff  . Gallbladder surgery  2000  . Brain surgery      meningioma resection       The following portions of the patient's history were reviewed and updated as appropriate: Allergies, current medications, and problem list.    Review of Systems:   Patient denies headache, fevers, malaise, unintentional weight loss, skin rash, eye pain, sinus congestion and sinus pain, sore throat, dysphagia,  hemoptysis , cough, dyspnea, wheezing, chest pain, palpitations, orthopnea, edema, abdominal pain, nausea, melena, diarrhea, constipation, flank pain, dysuria, hematuria, urinary  Frequency, nocturia, numbness, tingling, seizures,  Focal weakness, Loss of consciousness,  Tremor, insomnia, depression, anxiety, and suicidal ideation.     History   Social History  . Marital Status: Married    Spouse Name: N/A    Number of Children: N/A  . Years of Education: 16   Occupational History  .     Social History Main Topics  . Smoking status: Never Smoker   . Smokeless tobacco: Former Systems developer    Quit date: 07/05/1956  . Alcohol Use: No  . Drug Use:  No  . Sexual Activity: No   Other Topics Concern  . Not on file   Social History Narrative  . No narrative on file    Objective:  Filed Vitals:   10/30/13 1012  BP: 136/74  Pulse: 78  Temp: 97.7 F (36.5 C)  Resp: 16     General appearance: alert, cooperative and appears stated age Ears: normal TM's and external ear canals both ears Throat: lips, mucosa, and tongue normal; teeth and gums normal Neck: no adenopathy, no carotid bruit, supple, symmetrical, trachea midline and thyroid not enlarged, symmetric, no  tenderness/mass/nodules Back: symmetric, no curvature. ROM normal. No CVA tenderness. Lungs: clear to auscultation bilaterally Heart: regular rate and rhythm, S1, S2 normal, no murmur, click, rub or gallop Abdomen: soft, non-tender; bowel sounds normal; no masses,  no organomegaly Pulses: 2+ and symmetric Skin: Skin color, texture, turgor normal. No rashes or lesions Lymph nodes: Cervical, supraclavicular, and axillary nodes normal. Neuro: CNs 2-12 intact. DTRs 2+/4 in biceps, brachioradialis, patellars and achilles. Muscle strength 5/5 in upper and lower exremities. Fine resting tremor bilaterally both hands cerebellar function normal. Romberg negative.  No pronator drift.   Gait normal.   MMSE:  25/30 (could not recall president, month,  and 0/3 objects at 1 minute.   Assessment and Plan:  Dementia due to medical condition with behavioral disturbance MMSE was done, and his score is  25/30 today (was 27/30 per neurologist exam in Oct 2014)  with  short-term and long-term memory deficits noted to be significant. However he had no problems with calculations,  Spatial/geomoetric and clock drawing.  Because of his memory deficits he is at risk for unsafe medication administration if unsupervised. I will continue to recommend a day time caregiver to manage meals and medications .  Starting Aricept at bedtime and low dose citalopram  caregiver's report of tearfulness and irritability since stopping mirtazapine.. Continue when necessary lorazepam for agitation.  Neuropathy of foot Despite his reports of numbness of the left leg, he is able to pass my test today for sensation using cotton gauze. He examined he is not having any foot drop or altered gait. Reassurance provided.  Rash, drug Resolving with discontinuation of mirtazapine.  Unsteady gait Secondary to proximal muscle weakness. He has had no recent falls.  Diabetes mellitus with peripheral artery disease Well-controlled on diet alone .   hemoglobin A1c has been consistently less than 7.0 but has not been checked since July 2014 and patient missed his appt for fasting labs last week . Patient is up-to-date on eye exams and foot exam. There is  no proteinuria on prior  micro urinalysis .  Statin therapy was stopped for unclear reasons,  But will be resumed following repeat lipid assessment given his history of CAD and dementia.  A total of 40 minutes was spent with patient more than half of which was spent in counseling, reviewing records from other prviders and coordination of care.  Updated Medication List Outpatient Encounter Prescriptions as of 10/30/2013  Medication Sig  . acetaminophen (TYLENOL) 500 MG tablet Take 500 mg by mouth at bedtime.  Marland Kitchen aspirin 81 MG tablet Take 81 mg by mouth daily.    . ciclopirox (LOPROX) 0.77 % cream Apply topically as needed.    . diphenhydrAMINE (BENADRYL) 25 mg capsule Take 25 mg by mouth every 8 (eight) hours as needed.  . fluocinonide (LIDEX) 0.05 % cream Apply 1 application topically as needed.    . hydrocortisone (WESTCORT) 0.2 % cream  Apply 1 application topically as needed.    Marland Kitchen LORazepam (ATIVAN) 0.5 MG tablet TAKE ONE TABLET BY MOUTH AT BEDTIME. MAY REPEAT DOSE ONE TIME IF NEEDED.  . Multiple Vitamins-Minerals (PRESERVISION AREDS PO) Take by mouth.    . calcium carbonate (OS-CAL) 600 MG TABS Take 600 mg by mouth daily.  . citalopram (CELEXA) 10 MG tablet Take 1 tablet (10 mg total) by mouth daily.  Marland Kitchen donepezil (ARICEPT) 5 MG tablet Take 1 tablet (5 mg total) by mouth at bedtime.  . [DISCONTINUED] ketoconazole (NIZORAL) 2 % shampoo Apply 1 application topically 2 (two) times a week.  . [DISCONTINUED] Lactulose 20 GM/30ML SOLN Take 30 mLs (20 g total) by mouth every 6 (six) hours as needed. To relieve constipation  . [DISCONTINUED] nystatin ointment (MYCOSTATIN) Apply topically 2 (two) times daily.

## 2013-10-31 NOTE — Assessment & Plan Note (Signed)
Secondary to proximal muscle weakness. He has had no recent falls.

## 2013-10-31 NOTE — Telephone Encounter (Signed)
Derrick Sullivan was supposed to have fasting blood work last week and did not show. Please ask his caregiver to arrange this as soon as possible. He needs to have his cholesterol and his A1c checked as these have not been done in quite a while.

## 2013-10-31 NOTE — Assessment & Plan Note (Signed)
Despite his reports of numbness of the left leg, he is able to pass my test today for sensation using cotton gauze. He examined he is not having any foot drop or altered gait. Reassurance provided.

## 2013-10-31 NOTE — Assessment & Plan Note (Signed)
Resolving with discontinuation of mirtazapine.

## 2013-10-31 NOTE — Assessment & Plan Note (Addendum)
Well-controlled on diet alone .  hemoglobin A1c has been consistently less than 7.0 but has not been checked since July 2014 and patient missed his appt for fasting labs last week . Patient is up-to-date on eye exams and foot exam. There is  no proteinuria on prior  micro urinalysis .  Statin therapy was stopped for unclear reasons,  But will be resumed following repeat lipid assessment given his history of CAD and dementia.

## 2013-11-01 NOTE — Telephone Encounter (Signed)
Not able to reach patient left detailed message on home phone for caregiver. Per DPR.

## 2013-11-06 ENCOUNTER — Other Ambulatory Visit (INDEPENDENT_AMBULATORY_CARE_PROVIDER_SITE_OTHER): Payer: 59

## 2013-11-06 DIAGNOSIS — E1159 Type 2 diabetes mellitus with other circulatory complications: Secondary | ICD-10-CM

## 2013-11-06 DIAGNOSIS — I798 Other disorders of arteries, arterioles and capillaries in diseases classified elsewhere: Secondary | ICD-10-CM

## 2013-11-06 DIAGNOSIS — E1151 Type 2 diabetes mellitus with diabetic peripheral angiopathy without gangrene: Secondary | ICD-10-CM

## 2013-11-06 LAB — LIPID PANEL
CHOLESTEROL: 165 mg/dL (ref 0–200)
HDL: 35.4 mg/dL — ABNORMAL LOW (ref >39.00)
LDL Cholesterol: 119 mg/dL — ABNORMAL HIGH (ref 0–99)
Total CHOL/HDL Ratio: 5
Triglycerides: 54 mg/dL (ref 0.0–149.0)
VLDL: 10.8 mg/dL (ref 0.0–40.0)

## 2013-11-06 LAB — COMPREHENSIVE METABOLIC PANEL
ALK PHOS: 74 U/L (ref 39–117)
ALT: 15 U/L (ref 0–53)
AST: 17 U/L (ref 0–37)
Albumin: 3.7 g/dL (ref 3.5–5.2)
BILIRUBIN TOTAL: 1.1 mg/dL (ref 0.3–1.2)
BUN: 16 mg/dL (ref 6–23)
CO2: 28 mEq/L (ref 19–32)
CREATININE: 1 mg/dL (ref 0.4–1.5)
Calcium: 9.4 mg/dL (ref 8.4–10.5)
Chloride: 106 mEq/L (ref 96–112)
GFR: 75.93 mL/min (ref >60.00)
Glucose, Bld: 108 mg/dL — ABNORMAL HIGH (ref 70–99)
Potassium: 4.1 mEq/L (ref 3.5–5.1)
Sodium: 141 mEq/L (ref 135–145)
TOTAL PROTEIN: 6.6 g/dL (ref 6.0–8.3)

## 2013-11-06 LAB — MICROALBUMIN / CREATININE URINE RATIO
CREATININE, U: 53.8 mg/dL
MICROALB UR: 29.9 mg/dL — AB (ref 0.0–1.9)
MICROALB/CREAT RATIO: 55.6 mg/g — AB (ref 0.0–30.0)

## 2013-11-06 LAB — HEMOGLOBIN A1C: Hgb A1c MFr Bld: 6.4 % (ref 4.6–6.5)

## 2013-11-07 ENCOUNTER — Other Ambulatory Visit: Payer: Self-pay | Admitting: Internal Medicine

## 2013-11-07 MED ORDER — ENALAPRIL MALEATE 2.5 MG PO TABS: 2.5000 mg | ORAL_TABLET | Freq: Every day | ORAL | Status: DC

## 2013-11-15 ENCOUNTER — Telehealth: Payer: Self-pay | Admitting: *Deleted

## 2013-11-15 NOTE — Telephone Encounter (Signed)
Patient POA feels paperwork filled out for long term care insurance does not depict the severity of the patient cognitive abilities and is afraid they are to lose their long term care insurance. POA is to bring by paperwork to be re-evaluated on Monday, patient and POA would like for you to review and advise. POA stated patient wonders off from home and is sometimes found in the street, and that he is incontinent at times due to memory issues. FYI until monday

## 2013-11-27 ENCOUNTER — Encounter: Payer: Self-pay | Admitting: Internal Medicine

## 2013-11-27 ENCOUNTER — Ambulatory Visit (INDEPENDENT_AMBULATORY_CARE_PROVIDER_SITE_OTHER): Payer: 59 | Admitting: Internal Medicine

## 2013-11-27 VITALS — BP 142/60 | HR 82 | Temp 97.5°F | Resp 16 | Wt 164.8 lb

## 2013-11-27 DIAGNOSIS — F02818 Dementia in other diseases classified elsewhere, unspecified severity, with other behavioral disturbance: Secondary | ICD-10-CM

## 2013-11-27 DIAGNOSIS — F0281 Dementia in other diseases classified elsewhere with behavioral disturbance: Secondary | ICD-10-CM

## 2013-11-27 NOTE — Patient Instructions (Signed)
You need to use Eucerin skin cream to moisturize your skin

## 2013-11-27 NOTE — Assessment & Plan Note (Signed)
MMSE was done, and his score is  20/30 today with  short-term and long-term memory deficits noted to be significant, along with difficulty with calculations,  Spatial/geomoetric and clock drawing.  Because of his memory deficits he is at risk for unsafe medication administration if unsupervised. Because of his wandering He is unsafe to remain in his home without 24 hour supervision.  I recommend a 24 hour caregiver to maintain a safe environment for Derrick Sullivan and his wife.   Continue Aricept at bedtime and low dose citalopram   Continue when necessary lorazepam for agitation.

## 2013-11-27 NOTE — Progress Notes (Signed)
Patient ID: Derrick Sullivan, male   DOB: 23-Oct-1923, 80 y.o.   MRN: [id number]   Patient Active Problem List   Diagnosis Date Noted  . Unsteady gait 05/13/2013  . Rash, drug 03/16/2013  . Constipation - functional 10/02/2012  . Mitral insufficiency 05/11/2012  . Other and unspecified hyperlipidemia 03/27/2012  . Neuropathy of foot 11/27/2011  . Duodenal ulcer from aspirin/ibuprofen-like drugs (NSAID's) 09/26/2011  . Hypertension 09/26/2011  . Diabetes mellitus with peripheral artery disease 09/26/2011  . 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 Benign prostatic hypertrophy   . Benign meningioma   . Osteopenia   . Carotid stenosis, left   . Diverticulosis of colon     Subjective:  CC:   Chief Complaint  Patient presents with  . Follow-up    oOn paperwork    HPI:   Derrick Sullivan is a 80 y.o. male who presents for follow up on dementia.    Mr and Mrs Trotta are having increased difficulty managing their ADLS without assistance.  Mr. Till needs assistance taking his medications correctly and applying his medicated ointment.  He is unable to maintain proper hydration unless someone hands him a glass of water three times daily and directs him to drink it.  If his meals are not prepared and set before him he will not eat. He requires assistance dressing.  He needs supervision shaving and showering to prevent falls and mishaps with the electric razor.  He has dificulty getting up  from a  Seated position both from straight chairs and from recliners and has fallen during unsupervised attempts to rise .    His caregiver reports that He left the home on his own recently before the caregiver arrived,  To get the paper and wandered down the street and was unable to find his way home. He becomes lost  if left alone in the grocery store or department store. When  Faced with any delays or dificulties he becomes easily frustrated , belligerent and impatient;   and requires use of supportive and reassuring measures to calm him down.  It is my opinion that his dementia has progressed to the point that it is unsafe for him to continue living unsupervised for 12 hours daily, due to his wanderings and confusion.   His wife [name] can not provide this supervision.  [name] is now needing help dressing ,  bathing, showering,  And requires supervision during use of the commode due to history of falls without assistance.   She cannot prepare her own meals or take her medications unsupervised because she becomes confused.   She needs consistent encouragement to use her walker and frequently leaves it behind if she stops or arrives in a room. She needs assistance getting in and out of the car. She has had several falls which occurred while trying to dress herself .      Past Medical History  Diagnosis Date  . CAD (coronary artery disease)   . Peripheral vascular disease   . Degenerative disk disease   . Presbyacusis   . Osteoporosis   . Duodenal ulcer 07/2004    NSAID induced  . Hyperlipidemia   . Benign prostatic hypertrophy   . Benign meningioma 2006    s/p parietal resection  . Osteopenia   . Carotid artery stenosis, asymptomatic 2008    hemodynamically insignificant  . Diverticulosis of colon 2007  . Hypertension 09/26/2011    Past Surgical History  Procedure Laterality  Date  . Shoulder surgery  2000    rotator cuff  . Gallbladder surgery  2000  . Brain surgery      meningioma resection       The following portions of the patient's history were reviewed and updated as appropriate: Allergies, current medications, and problem list.    Review of Systems:   Patient denies headache, fevers, malaise, unintentional weight loss, skin rash, eye pain, sinus congestion and sinus pain, sore throat, dysphagia,  hemoptysis , cough, dyspnea, wheezing, chest pain, palpitations, orthopnea, edema, abdominal pain, nausea, melena, diarrhea, constipation,  flank pain, dysuria, hematuria, urinary  Frequency, nocturia, numbness, tingling, seizures,  Focal weakness, Loss of consciousness,  Tremor, insomnia, depression, anxiety, and suicidal ideation.     History   Social History  . Marital Status: Married    Spouse Name: N/A    Number of Children: N/A  . Years of Education: 16   Occupational History  .     Social History Main Topics  . Smoking status: Never Smoker   . Smokeless tobacco: Former Systems developer    Quit date: 07/05/1956  . Alcohol Use: No  . Drug Use: No  . Sexual Activity: No   Other Topics Concern  . Not on file   Social History Narrative  . No narrative on file    Objective:  Filed Vitals:   11/27/13 1145  BP: 142/60  Pulse: 82  Temp: 97.5 F (36.4 C)  Resp: 16     General appearance: alert, cooperative and appears stated age Ears: normal TM's and external ear canals both ears Throat: lips, mucosa, and tongue normal; teeth and gums normal Neck: no adenopathy, no carotid bruit, supple, symmetrical, trachea midline and thyroid not enlarged, symmetric, no tenderness/mass/nodules Back: symmetric, no curvature. ROM normal. No CVA tenderness. Lungs: clear to auscultation bilaterally Heart: regular rate and rhythm, S1, S2 normal, no murmur, click, rub or gallop Abdomen: soft, non-tender; bowel sounds normal; no masses,  no organomegaly Pulses: 2+ and symmetric Skin: Skin color, texture, turgor normal. No rashes or lesions Lymph nodes: Cervical, supraclavicular, and axillary nodes normal.  Assessment and Plan:  Dementia due to medical condition with behavioral disturbance MMSE was done, and his score is  20/30 today with  short-term and long-term memory deficits noted to be significant, along with difficulty with calculations,  Spatial/geomoetric and clock drawing.  Because of his memory deficits he is at risk for unsafe medication administration if unsupervised. Because of his wandering He is unsafe to remain in his  home without 24 hour supervision.  I recommend a 24 hour caregiver to maintain a safe environment for Mr. Matherne and his wife.   Continue Aricept at bedtime and low dose citalopram   Continue when necessary lorazepam for agitation.   A total of 25 minutes was spent with patient more than half of which was spent in counseling, reviewing records from other prviders and coordination of care.   Updated Medication List Outpatient Encounter Prescriptions as of 11/27/2013  Medication Sig  . acetaminophen (TYLENOL) 500 MG tablet Take 500 mg by mouth at bedtime.  Marland Kitchen aspirin 81 MG tablet Take 81 mg by mouth daily.    . calcium carbonate (OS-CAL) 600 MG TABS Take 600 mg by mouth daily.  . ciclopirox (LOPROX) 0.77 % cream Apply topically as needed.    . citalopram (CELEXA) 10 MG tablet Take 1 tablet (10 mg total) by mouth daily.  . diphenhydrAMINE (BENADRYL) 25 mg capsule Take 25 mg by  mouth every 8 (eight) hours as needed.  . donepezil (ARICEPT) 5 MG tablet Take 1 tablet (5 mg total) by mouth at bedtime.  . enalapril (VASOTEC) 2.5 MG tablet Take 1 tablet (2.5 mg total) by mouth daily.  . fluocinonide (LIDEX) 0.05 % cream Apply 1 application topically as needed.    . hydrocortisone (WESTCORT) 0.2 % cream Apply 1 application topically as needed.    . Multiple Vitamins-Minerals (PRESERVISION AREDS PO) Take by mouth.       No orders of the defined types were placed in this encounter.    No Follow-up on file.

## 2013-11-27 NOTE — Progress Notes (Signed)
Pre-visit discussion using our clinic review tool. No additional management support is needed unless otherwise documented below in the visit note.  

## 2014-01-16 ENCOUNTER — Emergency Department: Payer: Self-pay | Admitting: Emergency Medicine

## 2014-01-16 LAB — CBC
HCT: 40 % (ref 40.0–52.0)
HGB: 13.8 g/dL (ref 13.0–18.0)
MCH: 32 pg (ref 26.0–34.0)
MCHC: 34.5 g/dL (ref 32.0–36.0)
MCV: 93 fL (ref 80–100)
PLATELETS: 170 10*3/uL (ref 150–440)
RBC: 4.31 10*6/uL — AB (ref 4.40–5.90)
RDW: 13.2 % (ref 11.5–14.5)
WBC: 17.8 10*3/uL — ABNORMAL HIGH (ref 3.8–10.6)

## 2014-01-16 LAB — BASIC METABOLIC PANEL
ANION GAP: 4 — AB (ref 7–16)
BUN: 20 mg/dL — ABNORMAL HIGH (ref 7–18)
CHLORIDE: 104 mmol/L (ref 98–107)
CREATININE: 1.08 mg/dL (ref 0.60–1.30)
Calcium, Total: 8.9 mg/dL (ref 8.5–10.1)
Co2: 27 mmol/L (ref 21–32)
Glucose: 143 mg/dL — ABNORMAL HIGH (ref 65–99)
Osmolality: 275 (ref 275–301)
POTASSIUM: 4 mmol/L (ref 3.5–5.1)
Sodium: 135 mmol/L — ABNORMAL LOW (ref 136–145)

## 2014-01-16 LAB — URINALYSIS, COMPLETE
BILIRUBIN, UR: NEGATIVE
Bacteria: NONE SEEN
Glucose,UR: NEGATIVE mg/dL (ref 0–75)
Ketone: NEGATIVE
Leukocyte Esterase: NEGATIVE
NITRITE: NEGATIVE
PH: 6 (ref 4.5–8.0)
SPECIFIC GRAVITY: 1.014 (ref 1.003–1.030)
SQUAMOUS EPITHELIAL: NONE SEEN
WBC UR: 1 /HPF (ref 0–5)

## 2014-01-16 LAB — CK: CK, TOTAL: 129 U/L

## 2014-01-20 ENCOUNTER — Other Ambulatory Visit: Payer: Self-pay | Admitting: Internal Medicine

## 2014-01-22 ENCOUNTER — Ambulatory Visit (INDEPENDENT_AMBULATORY_CARE_PROVIDER_SITE_OTHER): Payer: 59 | Admitting: Internal Medicine

## 2014-01-22 ENCOUNTER — Encounter: Payer: Self-pay | Admitting: Internal Medicine

## 2014-01-22 VITALS — BP 138/76 | HR 67 | Temp 98.5°F | Resp 16 | Ht 69.0 in | Wt 159.5 lb

## 2014-01-22 DIAGNOSIS — D72829 Elevated white blood cell count, unspecified: Secondary | ICD-10-CM

## 2014-01-22 DIAGNOSIS — R269 Unspecified abnormalities of gait and mobility: Secondary | ICD-10-CM

## 2014-01-22 DIAGNOSIS — M949 Disorder of cartilage, unspecified: Secondary | ICD-10-CM

## 2014-01-22 DIAGNOSIS — M8580 Other specified disorders of bone density and structure, unspecified site: Secondary | ICD-10-CM

## 2014-01-22 DIAGNOSIS — F02818 Dementia in other diseases classified elsewhere, unspecified severity, with other behavioral disturbance: Secondary | ICD-10-CM

## 2014-01-22 DIAGNOSIS — F0281 Dementia in other diseases classified elsewhere with behavioral disturbance: Secondary | ICD-10-CM

## 2014-01-22 DIAGNOSIS — E871 Hypo-osmolality and hyponatremia: Secondary | ICD-10-CM

## 2014-01-22 DIAGNOSIS — R2681 Unsteadiness on feet: Secondary | ICD-10-CM

## 2014-01-22 DIAGNOSIS — M899 Disorder of bone, unspecified: Secondary | ICD-10-CM

## 2014-01-22 DIAGNOSIS — E559 Vitamin D deficiency, unspecified: Secondary | ICD-10-CM

## 2014-01-22 MED ORDER — CITALOPRAM HYDROBROMIDE 20 MG PO TABS: ORAL_TABLET | ORAL | Status: DC

## 2014-01-22 NOTE — Patient Instructions (Signed)
I am increasing the citalopram to 20 mg daily  I am discontinuing the lorazepam  From his drug list

## 2014-01-22 NOTE — Progress Notes (Signed)
Patient ID: Derrick Sullivan, male   DOB: [birth year], 80 y.o.   MRN: [id number]  Patient Active Problem List   Diagnosis Date Noted  . Unsteady gait 05/13/2013  . Rash, drug 03/16/2013  . Constipation - functional 10/02/2012  . Mitral insufficiency 05/11/2012  . Other and unspecified hyperlipidemia 03/27/2012  . Neuropathy of foot 11/27/2011  . Duodenal ulcer from aspirin/ibuprofen-like drugs (NSAID's) 09/26/2011  . Hypertension 09/26/2011  . Diabetes mellitus with peripheral artery disease 09/26/2011  . 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 Benign prostatic hypertrophy   . Benign meningioma   . Osteopenia   . Carotid stenosis, left   . Diverticulosis of colon     Subjective:  CC:   Chief Complaint  Patient presents with  . Follow-up    ER follow up fell at home, could not get up on his own.    HPI:   Derrick Sullivan is a 80 y.o. male who presents for  Follow up on recent ER visit for fall.  Hip films were negative for left hip fracture ,  But  osteopenia noted. He denies pain ,  Dizziness    Has been receiving lorazepam qhs by [place] RN who has apparently decide to ignore my directions.  patient and wife has no night supervision from 6 pm to 10 am .    Still having episodes once a week of agitation and outbursts managed with directive therapy.,  Taking the citalopram    Past Medical History  Diagnosis Date  . CAD (coronary artery disease)   . Peripheral vascular disease   . Degenerative disk disease   . Presbyacusis   . Osteoporosis   . Duodenal ulcer 07/2004    NSAID induced  . Hyperlipidemia   . Benign prostatic hypertrophy   . Benign meningioma 2006    s/p parietal resection  . Osteopenia   . Carotid artery stenosis, asymptomatic 2008    hemodynamically insignificant  . Diverticulosis of colon 2007  . Hypertension 09/26/2011    Past Surgical History  Procedure Laterality Date  . Shoulder surgery  2000   rotator cuff  . Gallbladder surgery  2000  . Brain surgery      meningioma resection       The following portions of the patient's history were reviewed and updated as appropriate: Allergies, current medications, and problem list.    Review of Systems:   Patient denies headache, fevers, malaise, unintentional weight loss, skin rash, eye pain, sinus congestion and sinus pain, sore throat, dysphagia,  hemoptysis , cough, dyspnea, wheezing, chest pain, palpitations, orthopnea, edema, abdominal pain, nausea, melena, diarrhea, constipation, flank pain, dysuria, hematuria, urinary  Frequency, nocturia, numbness, tingling, seizures,  Focal weakness, Loss of consciousness,  Tremor, insomnia, depression, anxiety, and suicidal ideation.     History   Social History  . Marital Status: Married    Spouse Name: N/A    Number of Children: N/A  . Years of Education: 16   Occupational History  .     Social History Main Topics  . Smoking status: Never Smoker   . Smokeless tobacco: Former Systems developer    Quit date: 07/05/1956  . Alcohol Use: No  . Drug Use: No  . Sexual Activity: No   Other Topics Concern  . Not on file   Social History Narrative  . No narrative on file    Objective:  Filed Vitals:   01/22/14 1406  BP: 138/76  Pulse:  67  Temp: 98.5 F (36.9 C)  Resp: 16     General appearance: alert, cooperative and appears stated age Ears: normal TM's and external ear canals both ears Throat: lips, mucosa, and tongue normal; teeth and gums normal Neck: no adenopathy, no carotid bruit, supple, symmetrical, trachea midline and thyroid not enlarged, symmetric, no tenderness/mass/nodules Back: symmetric, no curvature. ROM normal. No CVA tenderness. Lungs: clear to auscultation bilaterally Heart: regular rate and rhythm, S1, S2 normal, no murmur, click, rub or gallop Abdomen: soft, non-tender; bowel sounds normal; no masses,  no organomegaly Pulses: 2+ and symmetric Skin: Skin  color, texture, turgor normal. No rashes or lesions Lymph nodes: Cervical, supraclavicular, and axillary nodes normal.  Assessment and Plan:  Unsteady gait With recent fall in the setting of daily lorazepam use directed by RN,  Letter written to dc lorazepam and pharmacy called.   Osteopenia Noted on films, with no prior DEXA . Given his extreme age and dementia , the only med that would be safely given is prolia  .  Will order DEXA scan   Dementia due to medical condition with behavioral disturbance Increasing the celexa to 20 mg given recurrent anxiety driven outburst,   Lorazepam dc'd    Updated Medication List Outpatient Encounter Prescriptions as of 01/22/2014  Medication Sig  . aspirin 81 MG tablet Take 81 mg by mouth daily.    . citalopram (CELEXA) 20 MG tablet TAKE ONE TABLET BY MOUTH EVERY DAY  . donepezil (ARICEPT) 5 MG tablet Take 1 tablet (5 mg total) by mouth at bedtime.  . enalapril (VASOTEC) 2.5 MG tablet Take 1 tablet (2.5 mg total) by mouth daily.  . [DISCONTINUED] citalopram (CELEXA) 10 MG tablet TAKE ONE TABLET BY MOUTH EVERY DAY  . acetaminophen (TYLENOL) 500 MG tablet Take 500 mg by mouth at bedtime.  . calcium carbonate (OS-CAL) 600 MG TABS Take 600 mg by mouth daily.  . ciclopirox (LOPROX) 0.77 % cream Apply topically as needed.    . diphenhydrAMINE (BENADRYL) 25 mg capsule Take 25 mg by mouth every 8 (eight) hours as needed.  . fluocinonide (LIDEX) 0.05 % cream Apply 1 application topically as needed.    . hydrocortisone (WESTCORT) 0.2 % cream Apply 1 application topically as needed.    . Multiple Vitamins-Minerals (PRESERVISION AREDS PO) Take by mouth.       Orders Placed This Encounter  Procedures  . DG Bone Density  . Vit D  25 hydroxy (rtn osteoporosis monitoring)  . Comprehensive metabolic panel  . CBC with Differential    No Follow-up on file.

## 2014-01-23 LAB — CBC WITH DIFFERENTIAL/PLATELET
BASOS PCT: 0.6 % (ref 0.0–3.0)
Basophils Absolute: 0 10*3/uL (ref 0.0–0.1)
EOS PCT: 3.2 % (ref 0.0–5.0)
Eosinophils Absolute: 0.2 10*3/uL (ref 0.0–0.7)
HCT: 38.3 % — ABNORMAL LOW (ref 39.0–52.0)
HEMOGLOBIN: 13.2 g/dL (ref 13.0–17.0)
Lymphocytes Relative: 24.5 % (ref 12.0–46.0)
Lymphs Abs: 1.4 10*3/uL (ref 0.7–4.0)
MCHC: 34.5 g/dL (ref 30.0–36.0)
MCV: 93.5 fl (ref 78.0–100.0)
MONO ABS: 0.5 10*3/uL (ref 0.1–1.0)
Monocytes Relative: 9.1 % (ref 3.0–12.0)
NEUTROS PCT: 62.6 % (ref 43.0–77.0)
Neutro Abs: 3.6 10*3/uL (ref 1.4–7.7)
Platelets: 251 10*3/uL (ref 150.0–400.0)
RBC: 4.09 Mil/uL — ABNORMAL LOW (ref 4.22–5.81)
RDW: 12.9 % (ref 11.5–15.5)
WBC: 5.8 10*3/uL (ref 4.0–10.5)

## 2014-01-23 LAB — COMPREHENSIVE METABOLIC PANEL
ALT: 13 U/L (ref 0–53)
AST: 13 U/L (ref 0–37)
Albumin: 3.6 g/dL (ref 3.5–5.2)
Alkaline Phosphatase: 54 U/L (ref 39–117)
BILIRUBIN TOTAL: 0.8 mg/dL (ref 0.2–1.2)
BUN: 20 mg/dL (ref 6–23)
CO2: 29 mEq/L (ref 19–32)
Calcium: 9.4 mg/dL (ref 8.4–10.5)
Chloride: 107 mEq/L (ref 96–112)
Creatinine, Ser: 1.1 mg/dL (ref 0.4–1.5)
GFR: 68.64 mL/min (ref >60.00)
GLUCOSE: 79 mg/dL (ref 70–99)
Potassium: 4.1 mEq/L (ref 3.5–5.1)
Sodium: 141 mEq/L (ref 135–145)
Total Protein: 6 g/dL (ref 6.0–8.3)

## 2014-01-23 LAB — VITAMIN D 25 HYDROXY (VIT D DEFICIENCY, FRACTURES): Vit D, 25-Hydroxy: 44 ng/mL (ref 30–89)

## 2014-01-24 ENCOUNTER — Encounter: Payer: Self-pay | Admitting: *Deleted

## 2014-01-25 NOTE — Assessment & Plan Note (Signed)
Noted on films, with no prior DEXA . Given his extreme age and dementia , the only med that would be safely given is prolia  .  Will order DEXA scan

## 2014-01-25 NOTE — Assessment & Plan Note (Signed)
Increasing the celexa to 20 mg given recurrent anxiety driven outburst,   Lorazepam dc'd

## 2014-01-25 NOTE — Assessment & Plan Note (Signed)
With recent fall in the setting of daily lorazepam use directed by RN,  Letter written to dc lorazepam and pharmacy called.

## 2014-04-28 ENCOUNTER — Encounter: Payer: Self-pay | Admitting: Internal Medicine

## 2014-04-28 ENCOUNTER — Ambulatory Visit (INDEPENDENT_AMBULATORY_CARE_PROVIDER_SITE_OTHER): Payer: 59 | Admitting: Internal Medicine

## 2014-04-28 VITALS — BP 172/68 | HR 55 | Temp 97.5°F | Resp 16 | Ht 69.0 in | Wt 150.8 lb

## 2014-04-28 DIAGNOSIS — R6881 Early satiety: Secondary | ICD-10-CM

## 2014-04-28 DIAGNOSIS — D6489 Other specified anemias: Secondary | ICD-10-CM

## 2014-04-28 DIAGNOSIS — G5790 Unspecified mononeuropathy of unspecified lower limb: Secondary | ICD-10-CM

## 2014-04-28 DIAGNOSIS — L270 Generalized skin eruption due to drugs and medicaments taken internally: Secondary | ICD-10-CM

## 2014-04-28 DIAGNOSIS — R634 Abnormal weight loss: Secondary | ICD-10-CM

## 2014-04-28 DIAGNOSIS — T398X5A Adverse effect of other nonopioid analgesics and antipyretics, not elsewhere classified, initial encounter: Secondary | ICD-10-CM

## 2014-04-28 DIAGNOSIS — I10 Essential (primary) hypertension: Secondary | ICD-10-CM

## 2014-04-28 DIAGNOSIS — F0280 Dementia in other diseases classified elsewhere without behavioral disturbance: Secondary | ICD-10-CM

## 2014-04-28 DIAGNOSIS — G309 Alzheimer's disease, unspecified: Secondary | ICD-10-CM

## 2014-04-28 DIAGNOSIS — F02818 Dementia in other diseases classified elsewhere, unspecified severity, with other behavioral disturbance: Secondary | ICD-10-CM

## 2014-04-28 DIAGNOSIS — K269 Duodenal ulcer, unspecified as acute or chronic, without hemorrhage or perforation: Secondary | ICD-10-CM

## 2014-04-28 DIAGNOSIS — E1159 Type 2 diabetes mellitus with other circulatory complications: Secondary | ICD-10-CM

## 2014-04-28 DIAGNOSIS — G5791 Unspecified mononeuropathy of right lower limb: Secondary | ICD-10-CM

## 2014-04-28 DIAGNOSIS — Z8719 Personal history of other diseases of the digestive system: Secondary | ICD-10-CM

## 2014-04-28 DIAGNOSIS — T39395A Adverse effect of other nonsteroidal anti-inflammatory drugs [NSAID], initial encounter: Secondary | ICD-10-CM

## 2014-04-28 DIAGNOSIS — E1151 Type 2 diabetes mellitus with diabetic peripheral angiopathy without gangrene: Secondary | ICD-10-CM

## 2014-04-28 DIAGNOSIS — I798 Other disorders of arteries, arterioles and capillaries in diseases classified elsewhere: Secondary | ICD-10-CM

## 2014-04-28 DIAGNOSIS — T3995XA Adverse effect of unspecified nonopioid analgesic, antipyretic and antirheumatic, initial encounter: Secondary | ICD-10-CM

## 2014-04-28 DIAGNOSIS — F0281 Dementia in other diseases classified elsewhere with behavioral disturbance: Secondary | ICD-10-CM

## 2014-04-28 LAB — CBC WITH DIFFERENTIAL/PLATELET
BASOS PCT: 0.7 % (ref 0.0–3.0)
Basophils Absolute: 0 10*3/uL (ref 0.0–0.1)
EOS ABS: 0.3 10*3/uL (ref 0.0–0.7)
EOS PCT: 5.5 % — AB (ref 0.0–5.0)
HCT: 40.7 % (ref 39.0–52.0)
HEMOGLOBIN: 13.8 g/dL (ref 13.0–17.0)
Lymphocytes Relative: 23.3 % (ref 12.0–46.0)
Lymphs Abs: 1.3 10*3/uL (ref 0.7–4.0)
MCHC: 33.9 g/dL (ref 30.0–36.0)
MCV: 94.2 fl (ref 78.0–100.0)
Monocytes Absolute: 0.5 10*3/uL (ref 0.1–1.0)
Monocytes Relative: 9.1 % (ref 3.0–12.0)
NEUTROS ABS: 3.5 10*3/uL (ref 1.4–7.7)
NEUTROS PCT: 61.4 % (ref 43.0–77.0)
Platelets: 238 10*3/uL (ref 150.0–400.0)
RBC: 4.32 Mil/uL (ref 4.22–5.81)
RDW: 13.6 % (ref 11.5–15.5)
WBC: 5.6 10*3/uL (ref 4.0–10.5)

## 2014-04-28 LAB — COMPREHENSIVE METABOLIC PANEL
ALK PHOS: 62 U/L (ref 39–117)
ALT: 10 U/L (ref 0–53)
AST: 19 U/L (ref 0–37)
Albumin: 3.9 g/dL (ref 3.5–5.2)
BILIRUBIN TOTAL: 1 mg/dL (ref 0.2–1.2)
BUN: 22 mg/dL (ref 6–23)
CO2: 29 mEq/L (ref 19–32)
CREATININE: 0.9 mg/dL (ref 0.4–1.5)
Calcium: 9.8 mg/dL (ref 8.4–10.5)
Chloride: 102 mEq/L (ref 96–112)
GFR: 81.52 mL/min (ref >60.00)
Glucose, Bld: 93 mg/dL (ref 70–99)
Potassium: 4.5 mEq/L (ref 3.5–5.1)
SODIUM: 140 meq/L (ref 135–145)
TOTAL PROTEIN: 6.6 g/dL (ref 6.0–8.3)

## 2014-04-28 LAB — TSH: TSH: 1.53 u[IU]/mL (ref 0.35–4.50)

## 2014-04-28 LAB — H. PYLORI ANTIBODY, IGG: H Pylori IgG: NEGATIVE

## 2014-04-28 NOTE — Assessment & Plan Note (Addendum)
Elevated today.  Increasing enalapril dose to to 5 mg daily

## 2014-04-28 NOTE — Progress Notes (Signed)
Pre-visit discussion using our clinic review tool. No additional management support is needed unless otherwise documented below in the visit note.  

## 2014-04-28 NOTE — Patient Instructions (Addendum)
I want you to take 2 tylenol every morning and 2 every evening.    I am increasing your blood pressure medication , enalapril  ,  To 5 mg daily in the morning   Please make sure you are taking taking a baby aspirin daily for your arteries to stay open   You are due to see about your right carotid artery in September with [facility]  You have lost a lot of weight.  I am worried about your stomach.  I am ordering an ultrasound of your stomach and a take home stool test  To check for blood in your stools   Try either boost , or ensure one daily ,  To add calories to your daily intake

## 2014-04-28 NOTE — Progress Notes (Signed)
Patient ID: Derrick Sullivan, male   DOB: [birth year], 81 y.o.   MRN: [id number]   Patient Active Problem List   Diagnosis Date Noted  . Unsteady gait 05/13/2013  . Rash, drug 03/16/2013  . Constipation - functional 10/02/2012  . Mitral insufficiency 05/11/2012  . Other and unspecified hyperlipidemia 03/27/2012  . Neuropathy of foot 11/27/2011  . Duodenal ulcer due to nonsteroidal anti-inflammatory drug (NSAID) 09/26/2011  . Hypertension 09/26/2011  . Diabetes mellitus with peripheral artery disease 09/26/2011  . Alzheimer's dementia with behavioral disturbance 08/23/2011  . Benign prostatic hypertrophy   . Benign meningioma   . Osteopenia   . Carotid stenosis, left   . Diverticulosis of colon     Subjective:  CC:   Chief Complaint  Patient presents with  . Follow-up    left leg numbness, painful at night.  . Diabetes    HPI:   Derrick Sullivan is a 80 y.o. male who presents for 3 month follow up on chronic conditions including controlled DM type 2,  Peripheral neuroapthy secondary to history of meningionma, CAD and dementia.  Feeling pretty good, sleeping "fair " getting  About 7 hours .  Has NA until 6 pm  No naps during the day on a regular basis. Leg pain is causing him to walk slower , left leg prior surgery.  Has a chronic balance issue but no falls in recent months .   No tick bitres this summer    Past Medical History  Diagnosis Date  . CAD (coronary artery disease)   . Peripheral vascular disease   . Degenerative disk disease   . Presbyacusis   . Osteoporosis   . Duodenal ulcer 07/2004    NSAID induced  . Hyperlipidemia   . Benign prostatic hypertrophy   . Benign meningioma 2006    s/p parietal resection  . Osteopenia   . Carotid artery stenosis, asymptomatic 2008    hemodynamically insignificant  . Diverticulosis of colon 2007  . Hypertension 09/26/2011    Past Surgical History  Procedure Laterality Date  . Shoulder surgery  2000    rotator cuff  .  Gallbladder surgery  2000  . Brain surgery      meningioma resection       The following portions of the patient's history were reviewed and updated as appropriate: Allergies, current medications, and problem list.    Review of Systems:   Patient denies headache, fevers, malaise, unintentional weight loss, skin rash, eye pain, sinus congestion and sinus pain, sore throat, dysphagia,  hemoptysis , cough, dyspnea, wheezing, chest pain, palpitations, orthopnea, edema, abdominal pain, nausea, melena, diarrhea, constipation, flank pain, dysuria, hematuria, urinary  Frequency, nocturia, numbness, tingling, seizures,  Focal weakness, Loss of consciousness,  Tremor, insomnia, depression, anxiety, and suicidal ideation.     History   Social History  . Marital Status: Married    Spouse Name: N/A    Number of Children: N/A  . Years of Education: 16   Occupational History  .     Social History Main Topics  . Smoking status: Never Smoker   . Smokeless tobacco: Former Systems developer    Quit date: 07/05/1956  . Alcohol Use: No  . Drug Use: No  . Sexual Activity: No   Other Topics Concern  . Not on file   Social History Narrative  . No narrative on file    Objective:  Filed Vitals:   04/28/14 1102  BP: 172/68  Pulse: 55  Temp: 97.5  F (36.4 C)  Resp: 16     General appearance: alert, cooperative and appears stated age Ears: normal TM's and external ear canals both ears Throat: lips, mucosa, and tongue normal; teeth and gums normal Neck: no adenopathy, no carotid bruit, supple, symmetrical, trachea midline and thyroid not enlarged, symmetric, no tenderness/mass/nodules Back: symmetric, no curvature. ROM normal. No CVA tenderness. Lungs: clear to auscultation bilaterally Heart: regular rate and rhythm, S1, S2 normal, no murmur, click, rub or gallop Abdomen: soft, non-tender; bowel sounds normal; no masses,  no organomegaly Pulses: 2+ and symmetric Skin: Skin color, texture, turgor  normal. No rashes or lesions Lymph nodes: Cervical, supraclavicular, and axillary nodes normal.  Assessment and Plan:  Hypertension Elevated today.  Increasing enalapril dose to to 5 mg daily   Alzheimer's dementia with behavioral disturbance His behavioral disturbances have stopped with titration of celexa.  Lorazepam was stopped due to prior fall. Continue aricept.   Neuropathy of foot Secondary to prior meningioma resection.  Patient complains to staff of pain but denies pain in the office today.   avoiding gabapentin due to history of falls,  adding scheduled tylenol 1000 mg twice daily   Diabetes mellitus with peripheral artery disease Well-controlled on diet alone .  hemoglobin A1c has been consistently less than 7.0  Patient is up-to-date on eye exams and foot exam. There is  proteinuria on prior  micro urinalysis and patient is on ACE inhibitor, .  Statin therapy was stopped for unclear reasons,  But will be resumed given current lipid assessment, history of CAD and dementia.  Lab Results  Component Value Date   HGBA1C 6.4 11/06/2013   Lab Results  Component Value Date   CHOL 165 11/06/2013   HDL 35.40* 11/06/2013   LDLCALC 119* 11/06/2013   LDLDIRECT 74.3 03/14/2013   TRIG 54.0 11/06/2013   CHOLHDL 5 11/06/2013       Updated Medication List Outpatient Encounter Prescriptions as of 04/28/2014  Medication Sig  . acetaminophen (TYLENOL) 500 MG tablet Take 500 mg by mouth at bedtime.  Marland Kitchen aspirin 81 MG tablet Take 81 mg by mouth daily.    . calcium carbonate (OS-CAL) 600 MG TABS Take 600 mg by mouth daily.  . ciclopirox (LOPROX) 0.77 % cream Apply topically as needed.    . citalopram (CELEXA) 20 MG tablet TAKE ONE TABLET BY MOUTH EVERY DAY  . diphenhydrAMINE (BENADRYL) 25 mg capsule Take 25 mg by mouth every 8 (eight) hours as needed.  . donepezil (ARICEPT) 5 MG tablet Take 1 tablet (5 mg total) by mouth at bedtime.  . enalapril (VASOTEC) 2.5 MG tablet Take 1 tablet (2.5 mg total) by  mouth daily.  . fluocinonide (LIDEX) 0.05 % cream Apply 1 application topically as needed.    . hydrocortisone (WESTCORT) 0.2 % cream Apply 1 application topically as needed.    . Multiple Vitamins-Minerals (PRESERVISION AREDS PO) Take by mouth.    Marland Kitchen atorvastatin (LIPITOR) 10 MG tablet Take 1 tablet (10 mg total) by mouth daily.     Orders Placed This Encounter  Procedures  . Fecal occult blood, imunochemical  . US Abdomen Complete  . TSH  . CBC with Differential  . Comprehensive metabolic panel  . H. pylori antibody, IgG    No Follow-up on file.

## 2014-04-29 ENCOUNTER — Encounter: Payer: Self-pay | Admitting: *Deleted

## 2014-04-29 ENCOUNTER — Ambulatory Visit (INDEPENDENT_AMBULATORY_CARE_PROVIDER_SITE_OTHER): Payer: 59 | Admitting: Adult Health

## 2014-04-29 ENCOUNTER — Telehealth: Payer: Self-pay | Admitting: Internal Medicine

## 2014-04-29 ENCOUNTER — Encounter: Payer: Self-pay | Admitting: Adult Health

## 2014-04-29 VITALS — BP 146/77 | HR 56 | Temp 97.5°F | Resp 14 | Wt 152.0 lb

## 2014-04-29 DIAGNOSIS — Z0000 Encounter for general adult medical examination without abnormal findings: Secondary | ICD-10-CM

## 2014-04-29 LAB — HM DIABETES FOOT EXAM

## 2014-04-29 MED ORDER — ATORVASTATIN CALCIUM 10 MG PO TABS: 10.0000 mg | ORAL_TABLET | Freq: Every day | ORAL | Status: DC

## 2014-04-29 NOTE — Assessment & Plan Note (Signed)
Well-controlled on diet alone .  hemoglobin A1c has been consistently less than 7.0  Patient is up-to-date on eye exams and foot exam. There is  proteinuria on prior  micro urinalysis and patient is on ACE inhibitor, .  Statin therapy was stopped for unclear reasons,  But will be resumed given current lipid assessment, history of CAD and dementia.  Lab Results  Component Value Date   HGBA1C 6.4 11/06/2013   Lab Results  Component Value Date   CHOL 165 11/06/2013   HDL 35.40* 11/06/2013   LDLCALC 119* 11/06/2013   LDLDIRECT 74.3 03/14/2013   TRIG 54.0 11/06/2013   CHOLHDL 5 11/06/2013

## 2014-04-29 NOTE — Assessment & Plan Note (Addendum)
His behavioral disturbances have stopped with titration of celexa.  Lorazepam was stopped due to prior fall. Continue aricept.

## 2014-04-29 NOTE — Progress Notes (Signed)
Subjective:    Derrick Sullivan is a 80 y.o. male who presents for Medicare Annual/Subsequent preventive examination.   Preventive Screening-Counseling & Management  Tobacco History  Smoking status  . Never Smoker   Smokeless tobacco  . Former Systems developer  . Quit date: 07/05/1956    Problems Prior to Visit 1.   Current Problems (verified) Patient Active Problem List   Diagnosis Date Noted  . Unsteady gait 05/13/2013  . Constipation - functional 10/02/2012  . Mitral insufficiency 05/11/2012  . Other and unspecified hyperlipidemia 03/27/2012  . Neuropathy of foot 11/27/2011  . Duodenal ulcer due to nonsteroidal anti-inflammatory drug (NSAID) 09/26/2011  . Hypertension 09/26/2011  . Diabetes mellitus with peripheral artery disease 09/26/2011  . Alzheimer's dementia with behavioral disturbance 08/23/2011  . Benign prostatic hypertrophy   . Benign meningioma   . Osteopenia   . Carotid stenosis, left   . Diverticulosis of colon     Medications Prior to Visit Current Outpatient Prescriptions on File Prior to Visit  Medication Sig Dispense Refill  . acetaminophen (TYLENOL) 500 MG tablet Take 500 mg by mouth at bedtime.      Marland Kitchen aspirin 81 MG tablet Take 81 mg by mouth daily.        Marland Kitchen atorvastatin (LIPITOR) 10 MG tablet Take 1 tablet (10 mg total) by mouth daily.  90 tablet  3  . calcium carbonate (OS-CAL) 600 MG TABS Take 600 mg by mouth daily.      . ciclopirox (LOPROX) 0.77 % cream Apply topically as needed.        . citalopram (CELEXA) 20 MG tablet TAKE ONE TABLET BY MOUTH EVERY DAY  90 tablet  2  . diphenhydrAMINE (BENADRYL) 25 mg capsule Take 25 mg by mouth every 8 (eight) hours as needed.      . donepezil (ARICEPT) 5 MG tablet Take 1 tablet (5 mg total) by mouth at bedtime.  30 tablet  1  . enalapril (VASOTEC) 2.5 MG tablet Take 1 tablet (2.5 mg total) by mouth daily.  90 tablet  1  . fluocinonide (LIDEX) 0.05 % cream Apply 1 application topically as needed.        .  hydrocortisone (WESTCORT) 0.2 % cream Apply 1 application topically as needed.        . Multiple Vitamins-Minerals (PRESERVISION AREDS PO) Take by mouth.        . [DISCONTINUED] pantoprazole (PROTONIX) 40 MG tablet Take 40 mg by mouth daily.         No current facility-administered medications on file prior to visit.    Current Medications (verified) Current Outpatient Prescriptions  Medication Sig Dispense Refill  . acetaminophen (TYLENOL) 500 MG tablet Take 500 mg by mouth at bedtime.      Marland Kitchen aspirin 81 MG tablet Take 81 mg by mouth daily.        Marland Kitchen atorvastatin (LIPITOR) 10 MG tablet Take 1 tablet (10 mg total) by mouth daily.  90 tablet  3  . calcium carbonate (OS-CAL) 600 MG TABS Take 600 mg by mouth daily.      . ciclopirox (LOPROX) 0.77 % cream Apply topically as needed.        . citalopram (CELEXA) 20 MG tablet TAKE ONE TABLET BY MOUTH EVERY DAY  90 tablet  2  . diphenhydrAMINE (BENADRYL) 25 mg capsule Take 25 mg by mouth every 8 (eight) hours as needed.      . donepezil (ARICEPT) 5 MG tablet Take 1 tablet (5 mg total)  by mouth at bedtime.  30 tablet  1  . enalapril (VASOTEC) 2.5 MG tablet Take 1 tablet (2.5 mg total) by mouth daily.  90 tablet  1  . fluocinonide (LIDEX) 0.05 % cream Apply 1 application topically as needed.        . hydrocortisone (WESTCORT) 0.2 % cream Apply 1 application topically as needed.        . Multiple Vitamins-Minerals (PRESERVISION AREDS PO) Take by mouth.        . [DISCONTINUED] pantoprazole (PROTONIX) 40 MG tablet Take 40 mg by mouth daily.         No current facility-administered medications for this visit.     Allergies (verified) Mirtazapine   PAST HISTORY  Family History Family History  Problem Relation Age of Onset  . Diabetes Mother   . Heart disease Father     Social History History  Substance Use Topics  . Smoking status: Never Smoker   . Smokeless tobacco: Former Systems developer    Quit date: 07/05/1956  . Alcohol Use: No    Are there  smokers in your home (other than you)?  No  Risk Factors Current exercise habits: The patient does not participate in regular exercise at present.  Dietary issues discussed: Adequate diet. Some dietary indiscretions.   Cardiac risk factors: advanced age (older than 62 for men, 38 for women), dyslipidemia, hypertension, male gender and sedentary lifestyle.  Depression Screen (Note: if answer to either of the following is "Yes", a more complete depression screening is indicated)   Q1: Over the past two weeks, have you felt down, depressed or hopeless? No  Q2: Over the past two weeks, have you felt little interest or pleasure in doing things? Yes  Have you lost interest or pleasure in daily life? No  Do you often feel hopeless? No  Do you cry easily over simple problems? Yes  Activities of Daily Living In your present state of health, do you have any difficulty performing the following activities?:  Driving? Yes Managing money?  No Feeding yourself? No Getting from bed to chair? No Climbing a flight of stairs? Yes Preparing food and eating?: No Bathing or showering? No Getting dressed: No Getting to the toilet? No Using the toilet:No Moving around from place to place: No In the past year have you fallen or had a near fall?:Yes   Are you sexually active?  No  Do you have more than one partner?  Yes  Hearing Difficulties: Yes Do you often ask people to speak up or repeat themselves? Yes Do you experience ringing or noises in your ears? No Do you have difficulty understanding soft or whispered voices? Yes   Do you feel that you have a problem with memory? Yes  Do you often misplace items? Yes  Do you feel safe at home?  Yes  Cognitive Testing  Alert? Yes  Normal Appearance?Yes  Oriented to person? Yes  Place? Yes   Time? Yes  Recall of three objects?  Yes  Can perform simple calculations? Yes  Displays appropriate judgment?Yes  Can read the correct time from a watch  face?Yes   Advanced Directives have been discussed with the patient? Yes   List the Names of Other Physician/Practitioners you currently use: 1.  Dr. Jeni Salles - Ophthalmology 2.  Dr. Melrose Nakayama - Neurology 3.  Dr. Evorn Gong - Dermatology 4.  Dr. Nehemiah Massed - Cardiology 5.  [facility] - Audiology  Indicate any recent Medical Services you may have received from other  than [facility] providers in the past year (date may be approximate).  Immunization History  Administered Date(s) Administered  . Influenza Split 07/04/2013  . Pneumococcal Polysaccharide-23 06/12/2013  . Zoster 08/01/2006    Screening Tests Health Maintenance  Topic Date Due  . Tetanus/tdap  06/08/1945  . Colonoscopy  06/08/1976  . Influenza Vaccine  04/05/2014  . Hemoglobin A1c  05/09/2014  . Ophthalmology Exam  05/14/2014  . Foot Exam  10/14/2014  . Urine Microalbumin  11/07/2014  . Pneumococcal Polysaccharide Vaccine Age 32 And Over  Completed  . Zostavax  Completed    All answers were reviewed with the patient and necessary referrals were made:  Rey,Raquel, NP   04/29/2014   History reviewed: allergies, current medications, past family history, past medical history, past social history, past surgical history and problem list  Review of Systems No ROS, Medicare Wellness    Objective:     Vision by Snellen chart: right eye:20/40, left eye:20/40 Blood pressure 146/77, pulse 56, temperature 97.5 F (36.4 C), temperature source Oral, resp. rate 14, weight 152 lb (68.947 kg), SpO2 99.00%. Body mass index is 22.44 kg/(m^2).  No exam performed today, Medicare Wellness.     Assessment:     Patient presents for yearly preventative medicine examination. Medicare questionnaire was completed  All immunizations and health maintenance protocols were reviewed with the patient and needed orders were placed.  Appropriate screening laboratory values were reviewed for the patient including screening of hyperlipidemia,  renal function and hepatic function. These have been stable.  If indicated by BPH, a PSA was ordered.  Medication reconciliation,  past medical history, social history, problem list and allergies were reviewed in detail with the patient  Goals were established with regard to weight loss, exercise, and  diet in compliance with medications  End of life planning was discussed. He will discuss wishes with his HCPOA.        Plan:     During the course of the visit the patient was educated and counseled about appropriate screening and preventive services including:    Td vaccine  Advanced directives: has an advanced directive - a copy HAS NOT been provided.  Screenings were reviewed  Diet review for nutrition referral? Yes ____  Not Indicated _x___   Patient Instructions (the written plan) was given to the patient.  Medicare Attestation I have personally reviewed: The patient's medical and social history Their use of alcohol, tobacco or illicit drugs Their current medications and supplements The patient's functional ability including ADLs,fall risks, home safety risks, cognitive, and hearing and visual impairment Diet and physical activities Evidence for depression or mood disorders  The patient's weight, height, BMI, and visual acuity have been recorded in the chart.  I have made referrals, counseling, and provided education to the patient based on review of the above and I have provided the patient with a written personalized care plan for preventive services.     Rey,Raquel, NP   04/29/2014

## 2014-04-29 NOTE — Patient Instructions (Signed)
  You had your Medicare Wellness visit today.  Please return for follow up in 3 months with Dr. Derrel Nip.

## 2014-04-29 NOTE — Telephone Encounter (Signed)
Relevant patient education mailed to patient.  

## 2014-04-29 NOTE — Progress Notes (Signed)
Pre visit review using our clinic review tool, if applicable. No additional management support is needed unless otherwise documented below in the visit note. 

## 2014-04-29 NOTE — Assessment & Plan Note (Addendum)
Secondary to prior meningioma resection.  Patient complains to staff of pain but denies pain in the office today.   avoiding gabapentin due to history of falls,  adding scheduled tylenol 1000 mg twice daily

## 2014-05-01 ENCOUNTER — Other Ambulatory Visit (INDEPENDENT_AMBULATORY_CARE_PROVIDER_SITE_OTHER): Payer: 59

## 2014-05-01 ENCOUNTER — Ambulatory Visit: Payer: Self-pay | Admitting: Internal Medicine

## 2014-05-01 DIAGNOSIS — D6489 Other specified anemias: Secondary | ICD-10-CM

## 2014-05-01 LAB — FECAL OCCULT BLOOD, IMMUNOCHEMICAL: Fecal Occult Bld: NEGATIVE

## 2014-05-04 ENCOUNTER — Telehealth: Payer: Self-pay | Admitting: Internal Medicine

## 2014-05-04 NOTE — Telephone Encounter (Signed)
Ultrasound was normal ,  Nothing to explain the 12 lb wt loss this year. Given his normal FOBT , no further workup

## 2014-05-05 ENCOUNTER — Encounter: Payer: Self-pay | Admitting: *Deleted

## 2014-05-05 NOTE — Telephone Encounter (Signed)
Patient notified and voiced understanding.

## 2014-05-13 ENCOUNTER — Other Ambulatory Visit: Payer: Self-pay | Admitting: *Deleted

## 2014-05-13 MED ORDER — ENALAPRIL MALEATE 5 MG PO TABS: 5.0000 mg | ORAL_TABLET | Freq: Every day | ORAL | Status: DC

## 2014-05-13 NOTE — Telephone Encounter (Signed)
Fax from pharmacy, pt states enalapril dose increased to 5 mg daily. Per OV 04/28/14, dose was increased to 5 mg daily. New Rx sent.

## 2014-06-23 ENCOUNTER — Telehealth: Payer: Self-pay | Admitting: Internal Medicine

## 2014-06-23 NOTE — Telephone Encounter (Signed)
Pt dropped of handicap parking form to be filled out. Form in Dr. Lupita Dawn [email or web address]

## 2014-06-23 NOTE — Telephone Encounter (Signed)
In quick sign.  °

## 2014-06-24 NOTE — Telephone Encounter (Signed)
Returned to [employer] i signed

## 2014-06-24 NOTE — Telephone Encounter (Signed)
Copy made placed original at front notified patient ready for pick up.

## 2014-06-25 ENCOUNTER — Ambulatory Visit (INDEPENDENT_AMBULATORY_CARE_PROVIDER_SITE_OTHER): Payer: 59

## 2014-06-25 DIAGNOSIS — Z23 Encounter for immunization: Secondary | ICD-10-CM

## 2014-07-16 ENCOUNTER — Encounter: Payer: Self-pay | Admitting: Internal Medicine

## 2014-07-28 ENCOUNTER — Ambulatory Visit (INDEPENDENT_AMBULATORY_CARE_PROVIDER_SITE_OTHER): Payer: 59 | Admitting: Internal Medicine

## 2014-07-28 ENCOUNTER — Encounter (INDEPENDENT_AMBULATORY_CARE_PROVIDER_SITE_OTHER): Payer: Self-pay

## 2014-07-28 ENCOUNTER — Encounter: Payer: Self-pay | Admitting: Internal Medicine

## 2014-07-28 ENCOUNTER — Ambulatory Visit: Payer: 59 | Admitting: Internal Medicine

## 2014-07-28 VITALS — BP 160/62 | HR 73 | Resp 14 | Ht 69.0 in | Wt 162.0 lb

## 2014-07-28 DIAGNOSIS — G309 Alzheimer's disease, unspecified: Secondary | ICD-10-CM

## 2014-07-28 DIAGNOSIS — E1151 Type 2 diabetes mellitus with diabetic peripheral angiopathy without gangrene: Secondary | ICD-10-CM

## 2014-07-28 DIAGNOSIS — F0281 Dementia in other diseases classified elsewhere with behavioral disturbance: Secondary | ICD-10-CM

## 2014-07-28 DIAGNOSIS — E119 Type 2 diabetes mellitus without complications: Secondary | ICD-10-CM

## 2014-07-28 DIAGNOSIS — I10 Essential (primary) hypertension: Secondary | ICD-10-CM

## 2014-07-28 DIAGNOSIS — E1159 Type 2 diabetes mellitus with other circulatory complications: Secondary | ICD-10-CM

## 2014-07-28 DIAGNOSIS — G308 Other Alzheimer's disease: Secondary | ICD-10-CM

## 2014-07-28 DIAGNOSIS — I739 Peripheral vascular disease, unspecified: Secondary | ICD-10-CM

## 2014-07-28 DIAGNOSIS — R21 Rash and other nonspecific skin eruption: Secondary | ICD-10-CM | POA: Insufficient documentation

## 2014-07-28 DIAGNOSIS — Z23 Encounter for immunization: Secondary | ICD-10-CM

## 2014-07-28 LAB — COMPREHENSIVE METABOLIC PANEL
ALT: 14 U/L (ref 0–53)
AST: 15 U/L (ref 0–37)
Albumin: 4 g/dL (ref 3.5–5.2)
Alkaline Phosphatase: 59 U/L (ref 39–117)
BUN: 26 mg/dL — ABNORMAL HIGH (ref 6–23)
CALCIUM: 9.7 mg/dL (ref 8.4–10.5)
CHLORIDE: 103 meq/L (ref 96–112)
CO2: 29 mEq/L (ref 19–32)
CREATININE: 1 mg/dL (ref 0.4–1.5)
GFR: 74.93 mL/min (ref >60.00)
Glucose, Bld: 79 mg/dL (ref 70–99)
POTASSIUM: 3.9 meq/L (ref 3.5–5.1)
Sodium: 141 mEq/L (ref 135–145)
Total Bilirubin: 0.8 mg/dL (ref 0.2–1.2)
Total Protein: 6.2 g/dL (ref 6.0–8.3)

## 2014-07-28 LAB — HEMOGLOBIN A1C: HEMOGLOBIN A1C: 6.2 % (ref 4.6–6.5)

## 2014-07-28 LAB — HM DIABETES FOOT EXAM

## 2014-07-28 MED ORDER — ENALAPRIL MALEATE 10 MG PO TABS: 10.0000 mg | ORAL_TABLET | Freq: Every day | ORAL | Status: DC

## 2014-07-28 MED ORDER — NYSTATIN 100000 UNIT/GM EX POWD: Freq: Two times a day (BID) | CUTANEOUS | Status: DC

## 2014-07-28 NOTE — Progress Notes (Signed)
Pre visit review using our clinic review tool, if applicable. No additional management support is needed unless otherwise documented below in the visit note. 

## 2014-07-28 NOTE — Patient Instructions (Signed)
Increase the enalapril to 10 mg daily for elevated blood pressure  Adding nystatin powder  To area under right breast and  To the small  of back  Two times daily for rash   You have gained back the weight you lost!!   You need to drink 3 16 ounce servings of water daily  Return for labs in 3 months   You received the new Pneumonia vaccine today

## 2014-07-28 NOTE — Progress Notes (Signed)
Patient ID: Derrick Sullivan, male   DOB: November 10, 1923, 80 y.o.   MRN: [id number]  Patient Active Problem List   Diagnosis Date Noted  . Rash and nonspecific skin eruption 07/28/2014  . Unsteady gait 05/13/2013  . Constipation - functional 10/02/2012  . Mitral insufficiency 05/11/2012  . Other and unspecified hyperlipidemia 03/27/2012  . Neuropathy of foot 11/27/2011  . Duodenal ulcer due to nonsteroidal anti-inflammatory drug (NSAID) 09/26/2011  . Hypertension 09/26/2011  . Diabetes mellitus with peripheral artery disease 09/26/2011  . Alzheimer's dementia with behavioral disturbance 08/23/2011  . Benign prostatic hypertrophy   . Benign meningioma   . Osteopenia   . Carotid stenosis, left   . Diverticulosis of colon     Subjective:  CC:   Chief Complaint  Patient presents with  . Follow-up    3 month follow up DM    HPI:   Derrick Sullivan is a 80 y.o. male who presents for  Follow up on hypertension, DM type 2, diet controlled,  dementia with weight loss.  He is accompanied by his wife and his caregiver.  His blood pressure is elevated today.  He appears to be taking his lisinopril.    He has gained 10 lbs   Caregiver has been encouraging him to eat.   He reports a rash that has been present for one month along his belt line  In the Small of back, and under his right breast.  The rash is pruritic and has not spread      Past Medical History  Diagnosis Date  . CAD (coronary artery disease)   . Peripheral vascular disease   . Degenerative disk disease   . Presbyacusis   . Osteoporosis   . Duodenal ulcer 07/2004    NSAID induced  . Hyperlipidemia   . Benign prostatic hypertrophy   . Benign meningioma 2006    s/p parietal resection  . Osteopenia   . Carotid artery stenosis, asymptomatic 2008    hemodynamically insignificant  . Diverticulosis of colon 2007  . Hypertension 09/26/2011    Past Surgical History  Procedure Laterality Date  . Shoulder surgery  2000     rotator cuff  . Gallbladder surgery  2000  . Brain surgery      meningioma resection       The following portions of the patient's history were reviewed and updated as appropriate: Allergies, current medications, and problem list.    Review of Systems:   Patient denies headache, fevers, malaise, unintentional weight loss, skin rash, eye pain, sinus congestion and sinus pain, sore throat, dysphagia,  hemoptysis , cough, dyspnea, wheezing, chest pain, palpitations, orthopnea, edema, abdominal pain, nausea, melena, diarrhea, constipation, flank pain, dysuria, hematuria, urinary  Frequency, nocturia, numbness, tingling, seizures,  Focal weakness, Loss of consciousness,  Tremor, insomnia, depression, anxiety, and suicidal ideation.     History   Social History  . Marital Status: Married    Spouse Name: N/A    Number of Children: N/A  . Years of Education: 16   Occupational History  .     Social History Main Topics  . Smoking status: Never Smoker   . Smokeless tobacco: Former Systems developer    Quit date: 07/05/1956  . Alcohol Use: No  . Drug Use: No  . Sexual Activity: No   Other Topics Concern  . Not on file   Social History Narrative    Objective:  Filed Vitals:   07/28/14 1351  BP: 160/62  Pulse: 73  Resp: 14     General appearance: alert, cooperative and appears stated age Ears: normal TM's and external ear canals both ears Throat: lips, mucosa, and tongue normal; teeth and gums normal Neck: no adenopathy, no carotid bruit, supple, symmetrical, trachea midline and thyroid not enlarged, symmetric, no tenderness/mass/nodules Back: symmetric, no curvature. ROM normal. No CVA tenderness. Lungs: clear to auscultation bilaterally Heart: regular rate and rhythm, S1, S2 normal, no murmur, click, rub or gallop Abdomen: soft, non-tender; bowel sounds normal; no masses,  no organomegaly Pulses: 2+ and symmetric Skin: erythematous papular rash under right breast and in small  of back  Lymph nodes: Cervical, supraclavicular, and axillary nodes normal.  Assessment and Plan:  Diabetes mellitus with peripheral artery disease  Historically well-controlled on diet alone .  hemoglobin A1c has been consistently at or  less than 7.0 . Patient is up-to-date on eye exams and foot exam is normal today. Patient has  microalbuminuria. Patient is tolerating statin therapy for CAD risk reduction and on ACE/ARB for renal protection and hypertension .  Lab Results  Component Value Date   HGBA1C 6.2 07/28/2014   Lab Results  Component Value Date   MICROALBUR 29.9* 11/06/2013   Lab Results  Component Value Date   CHOL 165 11/06/2013   HDL 35.40* 11/06/2013   LDLCALC 119* 11/06/2013   LDLDIRECT 74.3 03/14/2013   TRIG 54.0 11/06/2013   CHOLHDL 5 11/06/2013     Hypertension Elevated today,  analopril has been increased  Alzheimer's dementia with behavioral disturbance He is having less agitation at night and his weight loss has resolved due to improved intake. +   Rash and nonspecific skin eruption Treating for candida given location    Updated Medication List Outpatient Encounter Prescriptions as of 07/28/2014  Medication Sig  . acetaminophen (TYLENOL) 500 MG tablet Take 500 mg by mouth at bedtime.  Marland Kitchen aspirin 81 MG tablet Take 81 mg by mouth daily.    Marland Kitchen atorvastatin (LIPITOR) 10 MG tablet Take 1 tablet (10 mg total) by mouth daily.  . calcium carbonate (OS-CAL) 600 MG TABS Take 600 mg by mouth daily.  . ciclopirox (LOPROX) 0.77 % cream Apply topically as needed.    . citalopram (CELEXA) 20 MG tablet TAKE ONE TABLET BY MOUTH EVERY DAY  . diphenhydrAMINE (BENADRYL) 25 mg capsule Take 25 mg by mouth every 8 (eight) hours as needed.  . donepezil (ARICEPT) 5 MG tablet Take 1 tablet (5 mg total) by mouth at bedtime.  . enalapril (VASOTEC) 10 MG tablet Take 1 tablet (10 mg total) by mouth daily.  . fluocinonide (LIDEX) 0.05 % cream Apply 1 application topically as  needed.    . hydrocortisone (WESTCORT) 0.2 % cream Apply 1 application topically as needed.    . Multiple Vitamins-Minerals (PRESERVISION AREDS PO) Take by mouth.    . nystatin (MYCOSTATIN) powder Apply topically 2 (two) times daily.  . [DISCONTINUED] enalapril (VASOTEC) 5 MG tablet Take 1 tablet (5 mg total) by mouth daily.     Orders Placed This Encounter  Procedures  . Pneumococcal conjugate vaccine 13-valent  . Hemoglobin A1c  . Comp Met (CMET)    Return in about 3 months (around 10/28/2014) for follow up diabetes.

## 2014-07-29 ENCOUNTER — Telehealth: Payer: Self-pay | Admitting: Internal Medicine

## 2014-07-29 ENCOUNTER — Encounter: Payer: Self-pay | Admitting: Internal Medicine

## 2014-07-29 NOTE — Assessment & Plan Note (Signed)
Elevated today,  analopril has been increased

## 2014-07-29 NOTE — Assessment & Plan Note (Signed)
Historically well-controlled on diet alone .  hemoglobin A1c has been consistently at or  less than 7.0 . Patient is up-to-date on eye exams and foot exam is normal today. Patient has  microalbuminuria. Patient is tolerating statin therapy for CAD risk reduction and on ACE/ARB for renal protection and hypertension .  Lab Results  Component Value Date   HGBA1C 6.2 07/28/2014   Lab Results  Component Value Date   MICROALBUR 29.9* 11/06/2013   Lab Results  Component Value Date   CHOL 165 11/06/2013   HDL 35.40* 11/06/2013   LDLCALC 119* 11/06/2013   LDLDIRECT 74.3 03/14/2013   TRIG 54.0 11/06/2013   CHOLHDL 5 11/06/2013

## 2014-07-29 NOTE — Assessment & Plan Note (Signed)
Treating for candida given location

## 2014-07-29 NOTE — Assessment & Plan Note (Signed)
He is having less agitation at night and his weight loss has resolved due to improved intake. +

## 2014-07-29 NOTE — Telephone Encounter (Signed)
Nurse from [facility] called for new medication list due to changes made on 07/28/14. Faxed copy to [facility] nurse for [facility].

## 2014-08-04 ENCOUNTER — Encounter: Payer: Self-pay | Admitting: *Deleted

## 2014-08-04 ENCOUNTER — Ambulatory Visit: Payer: 59 | Admitting: Internal Medicine

## 2014-09-16 ENCOUNTER — Other Ambulatory Visit: Payer: Self-pay | Admitting: Internal Medicine

## 2014-10-15 ENCOUNTER — Encounter: Payer: Self-pay | Admitting: Internal Medicine

## 2014-10-15 ENCOUNTER — Other Ambulatory Visit: Payer: Self-pay | Admitting: Internal Medicine

## 2014-10-15 ENCOUNTER — Ambulatory Visit (INDEPENDENT_AMBULATORY_CARE_PROVIDER_SITE_OTHER): Payer: Medicare Other | Admitting: Internal Medicine

## 2014-10-15 VITALS — BP 110/52 | HR 62 | Temp 97.8°F | Resp 14 | Ht 69.0 in | Wt 160.5 lb

## 2014-10-15 DIAGNOSIS — R2681 Unsteadiness on feet: Secondary | ICD-10-CM

## 2014-10-15 DIAGNOSIS — G909 Disorder of the autonomic nervous system, unspecified: Secondary | ICD-10-CM | POA: Insufficient documentation

## 2014-10-15 NOTE — Patient Instructions (Addendum)
Your blood pressure is DROPPING when you stand up,  This can make you dizzy,  You need to drink 3 bottles (16 ounce) of water daily and you need to add a little salt to your diet  This will improve your blood pressure .    Please check your blood pressure and pulse the next time this happens and make note if the episode is brought on by urinating or having a bowel movement

## 2014-10-15 NOTE — Progress Notes (Signed)
Pre-visit discussion using our clinic review tool. No additional management support is needed unless otherwise documented below in the visit note.  

## 2014-10-15 NOTE — Progress Notes (Signed)
Patient ID: Derrick Sullivan, male   DOB: [birth year], 81 y.o.   MRN: [id number]  Patient Active Problem List   Diagnosis Date Noted  . Autonomic dysfunction 10/15/2014  . Rash and nonspecific skin eruption 07/28/2014  . Unsteady gait 05/13/2013  . Constipation - functional 10/02/2012  . Mitral insufficiency 05/11/2012  . Other and unspecified hyperlipidemia 03/27/2012  . Neuropathy of foot 11/27/2011  . Duodenal ulcer due to nonsteroidal anti-inflammatory drug (NSAID) 09/26/2011  . Hypertension 09/26/2011  . Diabetes mellitus with peripheral artery disease 09/26/2011  . Alzheimer's dementia with behavioral disturbance 08/23/2011  . Benign prostatic hypertrophy   . Benign meningioma   . Osteopenia   . Carotid stenosis, left   . Diverticulosis of colon     Subjective:  CC:   Chief Complaint  Patient presents with  . Dizziness    Patient stated he does not recall CNA stated he is here for Dizziness    HPI:   Derrick Sullivan is a 81 y.o. male who presents for evaluation of Recurrent episodes of feeling faint.  Patient does not recall the episodes but is accompanied by his caregiver who states that he has had 2  or 3 episodes  in the last month which occur after he gets up and goes into the bedroom.  He yells "I',m going down",  Then he lies down for a few minutes,, then feels better   The CNa has not checked his BP during these epiodes and is not sure if the episodes occur after emptying his bladder.     Past Medical History  Diagnosis Date  . CAD (coronary artery disease)   . Peripheral vascular disease   . Degenerative disk disease   . Presbyacusis   . Osteoporosis   . Duodenal ulcer 07/2004    NSAID induced  . Hyperlipidemia   . Benign prostatic hypertrophy   . Benign meningioma 2006    s/p parietal resection  . Osteopenia   . Carotid artery stenosis, asymptomatic 2008    hemodynamically insignificant  . Diverticulosis of colon 2007  . Hypertension 09/26/2011     Past Surgical History  Procedure Laterality Date  . Shoulder surgery  2000    rotator cuff  . Gallbladder surgery  2000  . Brain surgery      meningioma resection       The following portions of the patient's history were reviewed and updated as appropriate: Allergies, current medications, and problem list.    Review of Systems:   Patient denies headache, fevers, malaise, unintentional weight loss, skin rash, eye pain, sinus congestion and sinus pain, sore throat, dysphagia,  hemoptysis , cough, dyspnea, wheezing, chest pain, palpitations, orthopnea, edema, abdominal pain, nausea, melena, diarrhea, constipation, flank pain, dysuria, hematuria, urinary  Frequency, nocturia, numbness, tingling, seizures,  Focal weakness, Loss of consciousness,  Tremor, insomnia, depression, anxiety, and suicidal ideation.     History   Social History  . Marital Status: Married    Spouse Name: N/A  . Number of Children: N/A  . Years of Education: 16   Occupational History  .     Social History Main Topics  . Smoking status: Never Smoker   . Smokeless tobacco: Former Systems developer    Quit date: 07/05/1956  . Alcohol Use: No  . Drug Use: No  . Sexual Activity: No   Other Topics Concern  . Not on file   Social History Narrative    Objective:  Filed Vitals:   10/15/14 1405  BP: 110/52  Pulse: 62  Temp: 97.8 F (36.6 C)  Resp: 14     General appearance: alert, cooperative and appears stated age Ears: normal TM's and external ear canals both ears Throat: lips, mucosa, and tongue normal; teeth and gums normal Neck: no adenopathy, no carotid bruit, supple, symmetrical, trachea midline and thyroid not enlarged, symmetric, no tenderness/mass/nodules Back: symmetric, no curvature. ROM normal. No CVA tenderness. Lungs: clear to auscultation bilaterally Heart: regular rate and rhythm, S1, S2 normal, no murmur, click, rub or gallop Abdomen: soft, non-tender; bowel sounds normal; no  masses,  no organomegaly Pulses: 2+ and symmetric Skin: Skin color, texture, turgor normal. No rashes or lesions Lymph nodes: Cervical, supraclavicular, and axillary nodes normal.  Assessment and Plan:  Autonomic dysfunction He has a drop in systolic of 10 pts with no compemsatory rise in pulse.  Advised to increase water and salt intake, and have CNa check BP/pulse next occurrence    Unsteady gait It is unclear if his recent episodes are due to loss of balance or orthostasis since he has mild orthostasis on exam today with autonomic dysfunction,.  Advised caregiver to Increase water and salt intake.     Updated Medication List Outpatient Encounter Prescriptions as of 10/15/2014  Medication Sig  . acetaminophen (TYLENOL) 500 MG tablet Take 500 mg by mouth at bedtime.  Marland Kitchen aspirin 81 MG tablet Take 81 mg by mouth daily.    Marland Kitchen atorvastatin (LIPITOR) 10 MG tablet Take 1 tablet (10 mg total) by mouth daily.  . calcium carbonate (OS-CAL) 600 MG TABS Take 600 mg by mouth daily.  . ciclopirox (LOPROX) 0.77 % cream Apply topically as needed.    . diphenhydrAMINE (BENADRYL) 25 mg capsule Take 25 mg by mouth every 8 (eight) hours as needed.  . donepezil (ARICEPT) 5 MG tablet Take 1 tablet (5 mg total) by mouth at bedtime.  . enalapril (VASOTEC) 10 MG tablet Take 1 tablet (10 mg total) by mouth daily.  . fluocinonide (LIDEX) 0.05 % cream Apply 1 application topically as needed.    . hydrocortisone (WESTCORT) 0.2 % cream Apply 1 application topically as needed.    . lactulose (CHRONULAC) 10 GM/15ML solution TAKE 30MLS (20 G TOTAL) BY MOUTH EVERY 6HOURS AS NEEDED TO RELIEVE CONSTIPATION  . Multiple Vitamins-Minerals (PRESERVISION AREDS PO) Take by mouth.    . nystatin (MYCOSTATIN) powder Apply topically 2 (two) times daily.  . [DISCONTINUED] citalopram (CELEXA) 20 MG tablet TAKE ONE TABLET BY MOUTH EVERY DAY     No orders of the defined types were placed in this encounter.    No Follow-up on  file.

## 2014-10-15 NOTE — Assessment & Plan Note (Signed)
He has a drop in systolic of 10 pts with no compemsatory rise in pulse.  Advised to increase water and salt intake, and have CNa check BP/pulse next occurrence

## 2014-10-15 NOTE — Telephone Encounter (Signed)
Pt has appoint with you today.  Please advise

## 2014-10-16 NOTE — Telephone Encounter (Signed)
Ok to refill,  Refill sent  

## 2014-10-16 NOTE — Telephone Encounter (Signed)
rx sent

## 2014-10-17 NOTE — Assessment & Plan Note (Signed)
It is unclear if his recent episodes are due to loss of balance or orthostasis since he has mild orthostasis on exam today with autonomic dysfunction,.  Advised caregiver to Increase water and salt intake.

## 2014-11-20 ENCOUNTER — Telehealth: Payer: Self-pay | Admitting: Internal Medicine

## 2014-11-20 NOTE — Telephone Encounter (Signed)
None of those are too low,  but if he is having symptoms of presyncope, he will need to have his BP changed.  We will stop the enalapril and i will prescribe atenolol 25 mg twice daily

## 2014-11-20 NOTE — Telephone Encounter (Signed)
Derrick Sullivan called from [place], stated that patient ortho pressure are varying greatly, but could not giver  Me the proper readings. Called patient home and ask CNa for readings sitting patient bp reported was 147/74 HR 85 standing = 90/62 HR 90. Ortho"s requested. Lying 161/89 HR 79 sitting 135/72 -79 standing 121/68  HR 91 4 minutes of standing 122/69 HR 90 Please advise.

## 2014-11-20 NOTE — Telephone Encounter (Signed)
Left message to call office

## 2014-11-21 MED ORDER — ATENOLOL 25 MG PO TABS: 25.0000 mg | ORAL_TABLET | Freq: Two times a day (BID) | ORAL | Status: DC

## 2014-11-21 NOTE — Telephone Encounter (Signed)
CNA in home is reporting that patient is having presyncope episodes changed order as written in chart below to MD orders and called [facility] to advise nurse awaiting callback from nurse.

## 2014-11-21 NOTE — Telephone Encounter (Signed)
Talked with Nurse Derrick Sullivan at [facility] and was advised also patient having presyncope episodes, medication atenolol sent to pharmacyas written by MD and Nurse aware.

## 2014-12-05 ENCOUNTER — Telehealth: Payer: Self-pay | Admitting: Internal Medicine

## 2014-12-05 MED ORDER — ENALAPRIL MALEATE 2.5 MG PO TABS: 2.5000 mg | ORAL_TABLET | Freq: Every day | ORAL | Status: DC

## 2014-12-05 MED ORDER — ATENOLOL 25 MG PO TABS: 12.5000 mg | ORAL_TABLET | Freq: Two times a day (BID) | ORAL | Status: DC

## 2014-12-05 NOTE — Telephone Encounter (Signed)
Peter Congo notified Nurse for [facility]

## 2014-12-05 NOTE — Telephone Encounter (Signed)
Please give the following written order to caregiver.  I have sent new rx's to pharmacy  resume enalapril 2.5 mg daily in the AM   reduce the atenolol to 12.5 mg or 1/2 tablet two times daily

## 2014-12-22 ENCOUNTER — Encounter: Payer: Self-pay | Admitting: Internal Medicine

## 2014-12-22 ENCOUNTER — Ambulatory Visit (INDEPENDENT_AMBULATORY_CARE_PROVIDER_SITE_OTHER): Payer: Medicare Other | Admitting: Internal Medicine

## 2014-12-22 VITALS — BP 110/58 | HR 53 | Temp 98.4°F | Resp 12 | Ht 69.0 in | Wt 165.8 lb

## 2014-12-22 DIAGNOSIS — R600 Localized edema: Secondary | ICD-10-CM | POA: Diagnosis not present

## 2014-12-22 DIAGNOSIS — R609 Edema, unspecified: Secondary | ICD-10-CM | POA: Diagnosis not present

## 2014-12-22 DIAGNOSIS — R0601 Orthopnea: Secondary | ICD-10-CM | POA: Diagnosis not present

## 2014-12-22 MED ORDER — FUROSEMIDE 20 MG PO TABS: 20.0000 mg | ORAL_TABLET | Freq: Every day | ORAL | Status: DC

## 2014-12-22 NOTE — Progress Notes (Signed)
Patient ID: Derrick Sullivan, male   DOB: [birth year], 81 y.o.   MRN: [id number]  Patient Active Problem List   Diagnosis Date Noted  . Bilateral lower extremity edema 12/23/2014  . Autonomic dysfunction 10/15/2014  . Rash and nonspecific skin eruption 07/28/2014  . Unsteady gait 05/13/2013  . Constipation - functional 10/02/2012  . Mitral insufficiency 05/11/2012  . Other and unspecified hyperlipidemia 03/27/2012  . Neuropathy of foot 11/27/2011  . Duodenal ulcer due to nonsteroidal anti-inflammatory drug (NSAID) 09/26/2011  . Hypertension 09/26/2011  . Diabetes mellitus with peripheral artery disease 09/26/2011  . Alzheimer's dementia with behavioral disturbance 08/23/2011  . Benign prostatic hypertrophy   . Benign meningioma   . Osteopenia   . Carotid stenosis, left   . Diverticulosis of colon     Subjective:  CC:   Chief Complaint  Patient presents with  . Edema    bilateral lower extremeties left is worse than right. pitting edema +4    HPI:   Derrick Sullivan is a 81 y.o. male who presents for bilateral edema pitting to knee,  Mild asymmetry   Left >  right,   No calf pain ,  HHA first noticed edema last Wednesday,  Has been getting worse over the past 6 days.  Does not weigh self at home.  Wt gain of 5 ls in office noted.  He denies chest pain,  Palpitations, and Dyspnea but coughs when he lies flat      Past Medical History  Diagnosis Date  . CAD (coronary artery disease)   . Peripheral vascular disease   . Degenerative disk disease   . Presbyacusis   . Osteoporosis   . Duodenal ulcer 07/2004    NSAID induced  . Hyperlipidemia   . Benign prostatic hypertrophy   . Benign meningioma 2006    s/p parietal resection  . Osteopenia   . Carotid artery stenosis, asymptomatic 2008    hemodynamically insignificant  . Diverticulosis of colon 2007  . Hypertension 09/26/2011    Past Surgical History  Procedure Laterality Date  . Shoulder surgery  2000    rotator  cuff  . Gallbladder surgery  2000  . Brain surgery      meningioma resection       The following portions of the patient's history were reviewed and updated as appropriate: Allergies, current medications, and problem list.    Review of Systems:   Patient denies headache, fevers, malaise, unintentional weight loss, skin rash, eye pain, sinus congestion and sinus pain, sore throat, dysphagia,  hemoptysis , cough, dyspnea, wheezing, chest pain, palpitations, orthopnea, edema, abdominal pain, nausea, melena, diarrhea, constipation, flank pain, dysuria, hematuria, urinary  Frequency, nocturia, numbness, tingling, seizures,  Focal weakness, Loss of consciousness,  Tremor, insomnia, depression, anxiety, and suicidal ideation.     History   Social History  . Marital Status: Married    Spouse Name: N/A  . Number of Children: N/A  . Years of Education: 16   Occupational History  .     Social History Main Topics  . Smoking status: Never Smoker   . Smokeless tobacco: Former Systems developer    Quit date: 07/05/1956  . Alcohol Use: No  . Drug Use: No  . Sexual Activity: No   Other Topics Concern  . Not on file   Social History Narrative    Objective:  Filed Vitals:   12/22/14 1642  BP: 110/58  Pulse: 53  Temp: 98.4 F (36.9 C)  Resp: 12  General appearance: alert, cooperative and appears stated age Ears: normal TM's and external ear canals both ears Throat: lips, mucosa, and tongue normal; teeth and gums normal Neck: no adenopathy, no carotid bruit, supple, symmetrical, trachea midline and thyroid not enlarged, symmetric, no tenderness/mass/nodules Back: symmetric, no curvature. ROM normal. No CVA tenderness. Lungs: clear to auscultation bilaterally Heart: regular rate and rhythm, S1, S2 normal, no murmur, click, rub or gallop Abdomen: soft, non-tender; bowel sounds normal; no masses,  no organomegaly Pulses: 2+ and symmetric Skin: 2+ pitting edema to proximal tibia, on  left,  1+ on right  Lymph nodes: Cervical, supraclavicular, and axillary nodes normal.  Assessment and Plan:  Bilateral lower extremity edema Etiology unclear.  He has no signs of DVT or of pulmonary edema on exam.BNP is < 500,  And proteinuria is mild.  Will treat with low dose lasix, follow up one week.  Lab Results  Component Value Date   TSH 1.53 04/28/2014   Lab Results  Component Value Date   NA 139 12/22/2014   K 4.1 12/22/2014   CL 104 12/22/2014   CO2 28 12/22/2014   Lab Results  Component Value Date   CREATININE 1.19 12/22/2014          Updated Medication List Outpatient Encounter Prescriptions as of 12/22/2014  Medication Sig  . acetaminophen (TYLENOL) 500 MG tablet Take 500 mg by mouth at bedtime.  Marland Kitchen aspirin 81 MG tablet Take 81 mg by mouth daily.    Marland Kitchen atenolol (TENORMIN) 25 MG tablet Take 0.5 tablets (12.5 mg total) by mouth 2 (two) times daily.  Marland Kitchen atorvastatin (LIPITOR) 10 MG tablet Take 1 tablet (10 mg total) by mouth daily.  . calcium carbonate (OS-CAL) 600 MG TABS Take 600 mg by mouth daily.  . ciclopirox (LOPROX) 0.77 % cream Apply topically as needed.    . citalopram (CELEXA) 20 MG tablet TAKE ONE TABLET BY MOUTH EVERY DAY  . diphenhydrAMINE (BENADRYL) 25 mg capsule Take 25 mg by mouth every 8 (eight) hours as needed.  . donepezil (ARICEPT) 5 MG tablet Take 1 tablet (5 mg total) by mouth at bedtime.  . enalapril (VASOTEC) 2.5 MG tablet Take 1 tablet (2.5 mg total) by mouth daily.  . fluocinonide (LIDEX) 0.05 % cream Apply 1 application topically as needed.    . hydrocortisone (WESTCORT) 0.2 % cream Apply 1 application topically as needed.    . lactulose (CHRONULAC) 10 GM/15ML solution TAKE 30MLS (20 G TOTAL) BY MOUTH EVERY 6HOURS AS NEEDED TO RELIEVE CONSTIPATION  . Multiple Vitamins-Minerals (PRESERVISION AREDS PO) Take by mouth.    . nystatin (MYCOSTATIN) powder Apply topically 2 (two) times daily.  . furosemide (LASIX) 20 MG tablet Take 1 tablet  (20 mg total) by mouth daily.     Orders Placed This Encounter  Procedures  . Brain natriuretic peptide  . Comprehensive metabolic panel  . CBC with Differential/Platelet  . Microalbumin / creatinine urine ratio    No Follow-up on file.

## 2014-12-22 NOTE — Patient Instructions (Signed)
You have gained nearly 5 lbs of water weight   I am prescribing furosemide ,  A fluid pill to take daily.    Please weigh yourself daily  Return in one week

## 2014-12-22 NOTE — Progress Notes (Signed)
Pre-visit discussion using our clinic review tool. No additional management support is needed unless otherwise documented below in the visit note.  

## 2014-12-23 ENCOUNTER — Encounter: Payer: Self-pay | Admitting: Internal Medicine

## 2014-12-23 DIAGNOSIS — R600 Localized edema: Secondary | ICD-10-CM | POA: Insufficient documentation

## 2014-12-23 LAB — CBC WITH DIFFERENTIAL/PLATELET
BASOS ABS: 0 10*3/uL (ref 0.0–0.1)
BASOS PCT: 0.4 % (ref 0.0–3.0)
EOS PCT: 5.4 % — AB (ref 0.0–5.0)
Eosinophils Absolute: 0.4 10*3/uL (ref 0.0–0.7)
HCT: 35.8 % — ABNORMAL LOW (ref 39.0–52.0)
Hemoglobin: 12.3 g/dL — ABNORMAL LOW (ref 13.0–17.0)
LYMPHS ABS: 1.2 10*3/uL (ref 0.7–4.0)
LYMPHS PCT: 15.4 % (ref 12.0–46.0)
MCHC: 34.4 g/dL (ref 30.0–36.0)
MCV: 91.7 fl (ref 78.0–100.0)
MONOS PCT: 7 % (ref 3.0–12.0)
Monocytes Absolute: 0.6 10*3/uL (ref 0.1–1.0)
NEUTROS ABS: 5.8 10*3/uL (ref 1.4–7.7)
Neutrophils Relative %: 71.8 % (ref 43.0–77.0)
Platelets: 201 10*3/uL (ref 150.0–400.0)
RBC: 3.9 Mil/uL — AB (ref 4.22–5.81)
RDW: 13.9 % (ref 11.5–15.5)
WBC: 8.1 10*3/uL (ref 4.0–10.5)

## 2014-12-23 LAB — BRAIN NATRIURETIC PEPTIDE: PRO B NATRI PEPTIDE: 274 pg/mL — AB (ref 0.0–100.0)

## 2014-12-23 LAB — MICROALBUMIN / CREATININE URINE RATIO
CREATININE, U: 166.4 mg/dL
MICROALB UR: 63.4 mg/dL — AB (ref 0.0–1.9)
Microalb Creat Ratio: 38.1 mg/g — ABNORMAL HIGH (ref 0.0–30.0)

## 2014-12-23 LAB — COMPREHENSIVE METABOLIC PANEL
ALT: 27 U/L (ref 0–53)
AST: 19 U/L (ref 0–37)
Albumin: 3.7 g/dL (ref 3.5–5.2)
Alkaline Phosphatase: 77 U/L (ref 39–117)
BUN: 22 mg/dL (ref 6–23)
CALCIUM: 9.8 mg/dL (ref 8.4–10.5)
CO2: 28 meq/L (ref 19–32)
CREATININE: 1.19 mg/dL (ref 0.40–1.50)
Chloride: 104 mEq/L (ref 96–112)
GFR: 61.24 mL/min (ref >60.00)
Glucose, Bld: 87 mg/dL (ref 70–99)
Potassium: 4.1 mEq/L (ref 3.5–5.1)
Sodium: 139 mEq/L (ref 135–145)
Total Bilirubin: 0.9 mg/dL (ref 0.2–1.2)
Total Protein: 6.4 g/dL (ref 6.0–8.3)

## 2014-12-23 NOTE — Assessment & Plan Note (Addendum)
Etiology unclear.  He has no signs of DVT or of pulmonary edema on exam.BNP is < 500,  And proteinuria is mild.  Will treat with low dose lasix, follow up one week.  Lab Results  Component Value Date   TSH 1.53 04/28/2014   Lab Results  Component Value Date   NA 139 12/22/2014   K 4.1 12/22/2014   CL 104 12/22/2014   CO2 28 12/22/2014   Lab Results  Component Value Date   CREATININE 1.19 12/22/2014

## 2014-12-24 ENCOUNTER — Encounter: Payer: Self-pay | Admitting: *Deleted

## 2014-12-26 NOTE — Discharge Summary (Signed)
PATIENT NAME:  Derrick Sullivan, Derrick Sullivan MR#:  [id number] DATE OF BIRTH:  1924-08-27  DATE OF ADMISSION:  06/11/2013 DATE OF DISCHARGE:  06/13/2013  PRESENTING COMPLAINT: Confusion and falls.   DISCHARGE DIAGNOSES:  1.  Acute renal failure, suspected due to dehydration/prerenal azotemia, resolved with intravenous hydration. Creatinine back to normal.  2.  Frequent falls, probably from unstable gait due to dehydration.  3.  Hypertension.  4.  Dementia early/mild. Follows up with Dr. Melrose Sullivan of neurology.  5.  Hyperlipidemia.   CODE STATUS: Full code.   DIET: 2 gram sodium diet.   Physical therapy.   FOLLOWUP: With Dr. Melrose Sullivan after discharge from rehab.   DISCHARGE MEDICATIONS:   1.  Amlodipine 5 mg daily.  2.  Remeron 15 mg at bedtime.  3.  Finasteride 5 mg daily.  4.  Atorvastatin 10 mg at bedtime.  5.  Terazosin 2 mg at bedtime.  6.  Lopressor 25 mg b.i.d.  7.  Norco 5/325 mg 1 tablet q.6 hours p.r.n.  8.  Plavix 75 mg daily.   CONSULTATION: Neurology, Dr. Melrose Sullivan.   BRIEF SUMMARY OF HOSPITAL COURSE AS WELL AS DISCHARGE LABS: Creatinine is 1.07, BUN is 23, sodium is 138 and potassium is 2.5. EKG: Sinus rhythm with PACs. CBC within normal limits, except hemoglobin and hematocrit of 12.7 and 36.3. LFTs within normal limits. Cardiac enzymes negative. UA negative for UTI. Serum ethanol level is 0.003. Mr. Derrick Sullivan is an 81 year old Caucasian gentleman with history of hypertension, who comes in from home because of altered mental status and falls. He was admitted with:  1.  Altered mental status/confusion likely due to acute kidney injury from prerenal azotemia from dehydration, which likely could have aggravated his dementia. He has received IV fluids. His creatinine is back to normal. Troponin 0.07. He has baseline early dementia per Dr. Lannie Sullivan evaluation. No evidence of CVA. The patient has been started for dementia workup as an outpatient by Dr. Melrose Sullivan.  2.  Frequent falls, probably from  unstable gait due to dehydration. Physical therapy recommends rehab. The patient is going to [facility].  3.  Dementia/confusion. The patient has baseline early dementia. Workup has been initiated by Dr. Melrose Sullivan as an outpatient. I did start a small dose of Remeron to see if that helps him with some of the confusion, which he had in the hospital.  4.  Hypertension. The patient is on metoprolol. Amlodipine was added.  5.  Hospital stay otherwise remained stable.   CODE STATUS: The patient remained a full code.   TIME SPENT: 40 minutes.   The discharge plan was discussed with the patient's family members. ____________________________ Derrick Rochester Posey Pronto, MD sap:aw D: 06/13/2013 11:19:23 ET T: 06/13/2013 11:33:24 ET JOB#: [id number]  cc: Derrick Sullivan A. Posey Pronto, MD, <Dictator> Derrick Basset MD ELECTRONICALLY SIGNED 06/28/2013 15:01

## 2014-12-26 NOTE — Consult Note (Signed)
Referring Physician:  Nicholes Mango :   Primary Care Physician:  Jacquenette Shone, [address] [place], [place] [address], [phone]  Reason for Consult: Admit Date: 11-Jun-2013  Chief Complaint: Altered mental status  Reason for Consult: altered mental status   History of Present Illness: History of Present Illness:   81 year old man was found to be getting more confused at home so was brought into the ED.  The patient apparently suffered two falls over the past week or so and had HCT that was unremarkable.  The patient's wife and family noticed that the patient was becoming progressively more confused and saying things that didnt make any sense.  In the ED the patient's creatinine seemed to have bumped up suggesting AKI potentially from dehydration.  Patient's wife says he had been eating and drinking less than normal lately.  Symptoms were constant but began to significantly improve once rehydration began.  During my encounter this AM, patient's wife says he is almost entirely back to his baseline level of cognitive function, she feels he is doing much better this afternoon.  Symptoms were initially severe and constant.  Provoking factor likely dehydration.  No other associated neurologic deficits. MEDICAL HISTORY: neck pain SURGICAL HISTORY: Status post brain surgery for meningioma.  HISTORY: Lives at home with wife. No history of smoking, alcohol or other illicit drug usage.  HISTORY: Dad was healthy, mom deceased at young age secondary to liver failure.   terazosin capsule (terazosin) 2 mg  -  1 capsule by mouth once a day tablet (clopidogrel) 75 mg  -  1 tablet by mouth once a day tablet (finasteride) 5 mg  -  1 tablet by mouth once a day tablet (atorvastatin) 10 mg  -  1 tablet by mouth every night tablet (alendronate) 70 mg  -  1 tablet by mouth once a week tablet,delayed release (DR/EC) (aspirin) 81 mg  -  1 tablet by mouth once a day powder (polyethylene glycol  3350) 17 gram/dose  - Take 17 grams dissolved in water once a day as needed [Take PRN constipation]       ROS:  General denies complaints   HEENT no complaints   Lungs no complaints   Cardiac no complaints   GI no complaints   GU no complaints   Musculoskeletal no complaints   Extremities no complaints   Skin no complaints   Endocrine no complaints   Psych no complaints   Past Medical/Surgical Hx:  Brain Tumor:   vertigo:   Hypercholesterolemia:   Rotator Cuff Surgery: left and right  Cholecystectomy:   rt rot cuff repair:   meningioma:   Home Medications: Medication Instructions Last Modified Date/Time  cyclobenzaprine 10 mg oral tablet 1 tab(s) orally 3 times a day, As Needed 03-Oct-14 12:53  acetaminophen-HYDROcodone 325 mg-5 mg oral tablet 1 tab(s) orally every 6 hours, As Needed - for Pain 06-Oct-14 21:03  ibuprofen 600 mg oral tablet 1 tab(s) orally 4 times a day 03-Oct-14 12:53  clopidogrel 75 mg oral tablet 1 tab(s) orally once a day 06-Oct-14 21:03  terazosin 2 mg oral capsule 1 cap(s) orally once a day (at bedtime) 03-Oct-14 12:51  finasteride 5 mg oral tablet 1 tab(s) orally once a day 03-Oct-14 12:51  alendronate 70 mg oral tablet 1 tab(s) orally once a week on  Wednesday. 06-Oct-14 21:03  metoprolol tartrate 25 mg oral tablet 1 tab(s) orally 2 times a day 03-Oct-14 12:51  atorvastatin 10 mg oral tablet 1  tab(s) orally once a day (at bedtime) 06-Oct-14 21:03   [facility] Current Meds:   Sodium Chloride 0.9%, 1000 ml at 100 ml/hr  HePARin injection, 5000 unit(s), Subcutaneous, q8h  Indication: Anticoagulant, Monitor Anticoags per hospital protocol  Ondansetron disintegrating tablet, ( Zofran ODT)  4 mg Oral q6h PRN for nausea, vomiting  - Indication: Nausea/ Vomiting  Pantoprazole tablet, 40 mg Oral q6am  - Indication: Erosive Esophagitis/ GERD  Instructions:  DO NOT CRUSH  Acetaminophen-HYDROcodone 325/5 mg tablet, ( Norco  5/325 mg)  1  tablet(s) Oral q6h PRN for pain  - Indication: Pain  Instructions:  [Med Admin Window: 30 mins before or after scheduled dose]  atorvaSTATin tablet, 10 mg Oral at bedtime  - Indication: Hypercholesterolemia  Clopidogrel tablet, 75 mg Oral daily  Instructions:  Initiate Bleeding Precautions Protocol  Finasteride tablet, ( Proscar)  5 mg Oral daily  - Indication: Benign Prostatic Hyperplasia  meTOProlol tartrate tablet, ( Lopressor)  25 mg Oral bid  - Indication: Antihypertensive/ Angina  Terazosin capsule, ( Hytrin)  2 mg Oral at bedtime  - Indication: Hypertension/ Benign Prostatic Hyperplasia  Influenza Virus Quadrivalent Vaccine injection, 0.5 ml, Intramuscular, once  Indication: provide Active Immunity to Influenza Strains contained in Vaccine, ***The patient must have a temperature of 100.5 or less, anything greater the patient needs to be afebrile x 24 hours before administration***, **Latex Free**  Nursing Saline Flush, 3 to 6 ml, IV push, Q1M PRN for IV Maintenance  Haloperidol injection,  ( Haldol injection )  1 to 2 mg, IV push, q4h PRN for agitation  Indication: Psychosis/ Delirium, agitation  Allergies:  No Known Allergies:   Vital Signs: **Vital Signs.:   07-Oct-14 09:18  Vital Signs Type Q 4hr  Temperature Temperature (F) 97.5  Celsius 36.3  Pulse Pulse 79  Pulse source if not from Vital Sign Device per Telemetry Clerk  Respirations Respirations 20  Systolic BP Systolic BP 638  Diastolic BP (mmHg) Diastolic BP (mmHg) 69  Mean BP 99  Pulse Ox % Pulse Ox % 94  Pulse Ox Activity Level  At rest  Oxygen Delivery Room Air/ 21 %  Telemetry pattern Cardiac Rhythm Normal sinus rhythm; pattern reported by Telemetry Clerk   EXAM: GENERAL: Pleasant and no distress.  Normocephalic and atraumatic.  EYES: Funduscopic exam shows normal disc size, appearance and C/D ratio without definite papilledema.  CARDIOVASCULAR: S1 and S2 sounds are within normal limits, without  murmurs, gallops, or rubs.  MUSCULOSKELETAL: Bulk - Normal Tone - Normal Pronator Drift - Absent bilaterally. Ambulation - Gait and station are deferred today since patient on falls precautions with AMS.  R/L 5/5    Shoulder abduction (deltoid/supraspinatus, axillary/suprascapular n, C5) 5/5    Elbow flexion (biceps brachii, musculoskeletal n, C5-6) 5/5    Elbow extension (triceps, radial n, C7) 5/5    Finger adduction (interossei, ulnar n, T1)  4/4-    Hip flexion (iliopsoas, L1/L2) 5/4    Knee flexion (hamstrings, sciatic n, L5/S1)  5/4    Knee extension (quadriceps, femoral n, L3/4) 5/4    Ankle dorsiflexion (tibialis anterior, deep fibular n, L4/5) 5/5    Ankle plantarflexion (gastroc, tibial n, S1)   NEUROLOGICAL: MENTAL STATUS: Patient is oriented to person, place and time.  Recent memory is mildy reduced but remote memory is at baseline level.  Correctly identifies year, month, president, hospital and without prompting correctly identifies that he saw me in clinic a few weeks ago.  Attention span and concentration  are intact.  Naming, repetition, comprehension and expressive speech are within normal limits.  Patient's fund of knowledge is within normal limits for educational level.  CRANIAL NERVES: Normal    CN II (normal visual acuity and visual fields) Normal    CN III, IV, VI (extraocular muscles are intact) Normal    CN V (facial sensation is intact bilaterally) Normal    CN VII (facial strength is intact bilaterally) Abnormal    CN VIII (hearing loss bilaterally( Normal    CN IX/X (palate elevates midline, normal phonation) Normal    CN XI (shoulder shrug strength is normal and symmetric) Normal    CN XII (tongue protrudes midline)  SENSATION: Intact to pain and temp bilaterally (spinothalamic tracts) Intact to position and vibration bilaterally (dorsal columns)  REFLEXES: R/L 2+/2+    Biceps 2+/2+    Brachioradialis  2+/2+    Patellar 2+/2+     Achilles  COORDINATION/CEREBELLAR: Finger to nose testing is within normal limits..  Lab Results:  Hepatic:  06-Oct-14 20:43   Bilirubin, Total  1.1  Alkaline Phosphatase 94  SGPT (ALT) 72  SGOT (AST) 35  Total Protein, Serum 7.3  Albumin, Serum 3.8  Routine Chem:  06-Oct-14 20:43   Ethanol, S. < 3  Ethanol % (comp) < 0.003 (Result(s) reported on 10 Jun 2013 at 09:48PM.)  Glucose, Serum  107  BUN  42  Creatinine (comp)  2.21  Sodium, Serum 137  Potassium, Serum 4.1  Chloride, Serum 105  CO2, Serum 26  Calcium (Total), Serum 9.9  Osmolality (calc) 285  eGFR (African American)  30  eGFR (Non-African American)  26 (eGFR values <91m/min/1.73 m2 may be an indication of chronic kidney disease (CKD). Calculated eGFR is useful in patients with stable renal function. The eGFR calculation will not be reliable in acutely ill patients when serum creatinine is changing rapidly. It is not useful in  patients on dialysis. The eGFR calculation may not be applicable to patients at the low and high extremes of body sizes, pregnant women, and vegetarians.)  Anion Gap  6  Cardiac:  06-Oct-14 20:43   CK, Total 141  CPK-MB, Serum  4.3 (Result(s) reported on 10 Jun 2013 at 10:01PM.)  Troponin I < 0.02 (0.00-0.05 0.05 ng/mL or less: NEGATIVE  Repeat testing in 3-6 hrs  if clinically indicated. >0.05 ng/mL: POTENTIAL  MYOCARDIAL INJURY. Repeat  testing in 3-6 hrs if  clinically indicated. NOTE: An increase or decrease  of 30% or more on serial  testing suggests a  clinically important change)  Routine UA:  06-Oct-14 20:43   Color (UA) Yellow  Clarity (UA) Clear  Glucose (UA) Negative  Bilirubin (UA) Negative  Ketones (UA) Negative  Specific Gravity (UA) 1.015  Blood (UA) Negative  pH (UA) 5.0  Protein (UA) 30 mg/dL  Nitrite (UA) Negative  Leukocyte Esterase (UA) Negative (Result(s) reported on 10 Jun 2013 at 10:29PM.)  RBC (UA) NONE SEEN  WBC (UA) 13 /HPF  Bacteria (UA)  NONE SEEN  Epithelial Cells (UA) <1 /HPF  Mucous (UA) PRESENT  Hyaline Cast (UA) 6 /LPF (Result(s) reported on 10 Jun 2013 at 10:29PM.)  Routine Hem:  06-Oct-14 20:43   WBC (CBC) 6.9  RBC (CBC)  3.98  Hemoglobin (CBC)  12.7  Hematocrit (CBC)  36.3  Platelet Count (CBC) 208 (Result(s) reported on 10 Jun 2013 at 09:03PM.)  MCV 91  MCH 32.0  MCHC 35.1  RDW 13.6   Radiology Results: CT:    06-Oct-14 21:15,  CT Head Without Contrast  CT Head Without Contrast   REASON FOR EXAM:    altered mental status  COMMENTS:       PROCEDURE: CT  - CT HEAD WITHOUT CONTRAST  - Jun 10 2013  9:15PM     RESULT: History: Altered mental status.    Comparison Study: Head CT 06/05/2013.    Findings: Standard nonenhanced CT obtained. No mass. No hydrocephalus. No   hemorrhage. Changes of prior craniotomy noted. Adjacent subcortical White   matter changes noted most consistent with gliosis. Vascular   calcification. No acute bony abnormality identified.    IMPRESSION:  No acute intracranial abnormality identified. Prior     craniotomy. Adjacent right posterior parietal gliosis.        Verified By: Osa Craver, M.D., MD   Impression/Recommendations: Recommendations:   81 year old man with progressive confusion, found to be dehydrated.   cognitive status appears to be improving after rehydration therapy according to patient and his wife, on admission seen to have AKI with likely metabolic encephalopathy.  On my assessment this afternoon, patient correctly identifies the year, month and name of the hospital.  Patient is engaging in conversation and appropriate.  He correctly remembers that he saw me in clinic 2-3 weeks ago.  From my outpatient baseline assessment the patient has mild memory loss at baseline with recent MMSE 27/30.  HCT unremarkable.  Nonfocal neurologic exam.  At this point, would not recommend any additional workup or testing from a neuro standpoint.  I discussed with patient and  his wife that I feel confident that his cognitive status will continue to improve gradually.    I have reviewed the results of the most recent imaging studies, tests and labs as outlined above and answered all related questions.  I have personally viewed the patient's HCT and there are no acute changes.    Electronic Signatures: Anabel Bene (MD)  (Signed 08-Oct-14 02:14)  Authored: REFERRING PHYSICIAN, Primary Care Physician, Consult, History of Present Illness, Review of Systems, PAST MEDICAL/SURGICAL HISTORY, HOME MEDICATIONS, Current Medications, ALLERGIES, NURSING VITAL SIGNS, Physical Exam-, LAB RESULTS, RADIOLOGY RESULTS, Recommendations   Last Updated: 08-Oct-14 02:14 by Anabel Bene (MD)

## 2014-12-26 NOTE — H&P (Signed)
PATIENT NAME:  Derrick Sullivan, Derrick Sullivan MR#:  [id number] DATE OF BIRTH:  29-Mar-1924  DATE OF ADMISSION:  06/11/2013  PRIMARY CARE PHYSICIAN: Dr. Derrel Nip   REFERRING PHYSICIAN: Dr. Jasmine December   CHIEF COMPLAINT: Altered mental status.   HISTORY OF PRESENT ILLNESS: The patient is an 81 year old Caucasian male brought into the ER by his brother-in-law for altered mental status. The patient lives at home and since yesterday afternoon, he was talking out of head,  as reported by the brother-in-law. He fell on Wednesday, and he was brought into the ER. Last Wednesday, the patient had CAT scan of the head which has revealed no acute changes and he was sent over to the home. Again, patient fell on Sunday evening and the whole day yesterday, he was talking out of his head which was concerning to the family, and he was brought into the ER. The brother-in-law reporting that the patient is not drinking much for the past few days. The patient is a pleasantly confused and could not answer most of my questions. The patient denies any chest pain or shortness of breath. Denies any abdominal pain, nausea, vomiting, diarrhea, denies any headache, either.   PAST MEDICAL HISTORY: Hypertension, hyperlipidemia, chronic neck pain.   PAST SURGICAL HISTORY: Status post brain surgery for meningioma.   SOCIAL HISTORY: Lives at home with wife. No history of smoking, alcohol or other illicit drug usage.   FAMILY HISTORY: Dad was healthy, mom deceased at young age secondary to liver failure.   REVIEW OF SYSTEMS: Unobtainable as the patient is pleasantly confused.   PHYSICAL EXAMINATION: VITAL SIGNS: Temperature 98 degrees Fahrenheit, pulse 70, respirations 22, blood pressure 171/83, pulse oximetry 96%.  GENERAL APPEARANCE: Not in acute distress. Moderately built and nourished.  HEENT: Normocephalic, atraumatic. Pupils are equally reactive to light and accommodation. No scleral icterus. No conjunctival injection. No sinus tenderness.   No postnasal drip. Moist mucous membranes.  NECK: Supple. No JVD. No thyromegaly. Range of motion of the neck is intact.  LUNGS: Clear to auscultation bilaterally. No accessory muscle use and no anterior chest wall tenderness on palpation.  CARDIAC: S1, S2 normal. Regular rate and rhythm. No murmurs.  GASTROINTESTINAL: Soft. Bowel sounds are positive in all four quadrants. Nontender, nondistended. No hepatosplenomegaly. No masses felt.  NEUROLOGIC: Awake and alert, but disoriented, as patient is confused. He is having a hard time in following verbal commands and reflexes are 2+. Motor and sensory seem to be grossly intact.  EXTREMITIES: No edema. No cyanosis. No clubbing.  SKIN: Warm to touch. Dry in nature. Normal turgor. No rashes. No lesions.  MUSCULOSKELETAL: No joint effusion, tenderness or erythema.  PSYCHIATRIC: Mood and affect could not be elicited in view of altered mental status.   LABORATORY, DIAGNOSTIC AND RADIOLOGIC DATA:  Glucose 107, BUN 42, creatinine 2.21, which trended up from 0.92 October 1, sodium 157, potassium 4.1, chloride 105, CO2 26, GFR 26, anion gap is 6. Serum osmolality 285, calcium 9.9, Serum alcohol less than 3. LFTs: Total protein 7.3, albumin 3.8, total bilirubin is 1.1. The rest of the LFTs are normal. CK total is 141, CPK-MB 4.3, troponin less than 0.02. WBC 6.9, hemoglobin 12.7, hematocrit is 36.3, platelets 208.   Urinalysis:  Yellow in color, clear in appearance, glucose, bilirubin, ketones are negative, nitrites and leukocyte esterase are negative.   CAT scan of the head without intravenous contrast has revealed craniectomy changes in the right parietal calvarium near the vertex.   white matter, may represent gliotic changes.  Calcified plaque is present within the intracranial vasculature.  Twelve-lead EKG: Normal sinus rhythm with premature atrial complexes, rate at 60 beats per minute, normal PR and QRS interval. Normal QT and acute ST-T wave changes.    ASSESSMENT AND PLAN: An 81 year old Caucasian male brought into the ER for altered mental status and frequent falls will be admitted with the following assessment and plan:  1.  Altered mental status, probably from acute kidney injury most likely from dehydration. We will admit him to telemetry bed. Provide IV fluids. Check morning labs to follow the BUN creatinine trend.  We will consider Foley catheter to overcome postrenal etiology of acute kidney injury. If there is no improvement, we will consider nephrology consult.  2.  Frequent falls, probably from unstable gait. We will put a consult to physical therapy.  3.  Hypertension. Resume his home medications.  4.  Hyperlipidemia. Check fasting lipid panel and continue statin.  5.  We will provide gastrointestinal and deep vein thrombosis prophylaxis.  6.  He is full code. Wife is medical power of attorney.   Diagnosis and plan of care was discussed in detail with brother-in-law at bedside. He verbalized understanding of the plan.   TOTAL TIME SPENT:  45 minutes.    ____________________________ Nicholes Mango, MD ag:cc D: 06/11/2013 00:16:27 ET T: 06/11/2013 00:39:34 ET JOB#: [id number]  cc: Nicholes Mango, MD, <Dictator> Deborra Medina, MD  Nicholes Mango MD ELECTRONICALLY SIGNED 06/15/2013 11:42

## 2014-12-29 ENCOUNTER — Encounter: Payer: Self-pay | Admitting: Internal Medicine

## 2014-12-29 ENCOUNTER — Ambulatory Visit (INDEPENDENT_AMBULATORY_CARE_PROVIDER_SITE_OTHER): Payer: Medicare Other | Admitting: Internal Medicine

## 2014-12-29 VITALS — BP 120/50 | HR 58 | Temp 98.7°F | Resp 14 | Ht 69.0 in | Wt 159.5 lb

## 2014-12-29 DIAGNOSIS — R0989 Other specified symptoms and signs involving the circulatory and respiratory systems: Secondary | ICD-10-CM

## 2014-12-29 DIAGNOSIS — G909 Disorder of the autonomic nervous system, unspecified: Secondary | ICD-10-CM

## 2014-12-29 DIAGNOSIS — R600 Localized edema: Secondary | ICD-10-CM

## 2014-12-29 DIAGNOSIS — R609 Edema, unspecified: Secondary | ICD-10-CM | POA: Diagnosis not present

## 2014-12-29 NOTE — Patient Instructions (Signed)
Please change the furosemide to every OTHER day  We are referring you to have your circulation checked before we add compression stockings  On a  Daily basis  The office will call the RN at [facility] to arrange the appointment

## 2014-12-29 NOTE — Progress Notes (Signed)
Pre-visit discussion using our clinic review tool. No additional management support is needed unless otherwise documented below in the visit note.  

## 2014-12-29 NOTE — Progress Notes (Signed)
Patient ID: Derrick Sullivan, male   DOB: 07/09/1924, 81 y.o.   MRN: [id number]  Patient Active Problem List   Diagnosis Date Noted  . Bilateral lower extremity edema 12/23/2014  . Autonomic dysfunction 10/15/2014  . Rash and nonspecific skin eruption 07/28/2014  . Unsteady gait 05/13/2013  . Constipation - functional 10/02/2012  . Mitral insufficiency 05/11/2012  . Other and unspecified hyperlipidemia 03/27/2012  . Neuropathy of foot 11/27/2011  . Duodenal ulcer due to nonsteroidal anti-inflammatory drug (NSAID) 09/26/2011  . Hypertension 09/26/2011  . Diabetes mellitus with peripheral artery disease 09/26/2011  . Alzheimer's dementia with behavioral disturbance 08/23/2011  . Benign prostatic hypertrophy   . Benign meningioma   . Osteopenia   . Carotid stenosis, left   . Diverticulosis of colon     Subjective:  CC:   Chief Complaint  Patient presents with  . Follow-up    HPI:  RETURN FOR one week FOLLOW UP ON edema  History:    Patient developed bilateral edema pitting to knee,  Mild asymmetry   Left >  right,   No calf pain ,  HHA first noticed edema 5 days ago,  Has been getting worse over the past 6 days.  Does not weigh self at home.  Wt gain of 5 lbs in office noted Denied chest pain,  Palpitations, and Dyspnea but coughs when he lies flat   . LABS Were not concerning for CHF,  BNP was < 300.  He was prescribec 20 mg lasix and one week return.  Has lost 6 lbs over the past week.  Feels fine.     2) Orthostasis; HHA is checking orthostatics daily and readings are positive,  But standing systolics are still > 366.     Derrick Sullivan is a 81 y.o. male who presents for   Past Medical History  Diagnosis Date  . CAD (coronary artery disease)   . Peripheral vascular disease   . Degenerative disk disease   . Presbyacusis   . Osteoporosis   . Duodenal ulcer 07/2004    NSAID induced  . Hyperlipidemia   . Benign prostatic hypertrophy   . Benign meningioma 2006     s/p parietal resection  . Osteopenia   . Carotid artery stenosis, asymptomatic 2008    hemodynamically insignificant  . Diverticulosis of colon 2007  . Hypertension 09/26/2011    Past Surgical History  Procedure Laterality Date  . Shoulder surgery  2000    rotator cuff  . Gallbladder surgery  2000  . Brain surgery      meningioma resection       The following portions of the patient's history were reviewed and updated as appropriate: Allergies, current medications, and problem list.    Review of Systems:   Patient denies headache, fevers, malaise, unintentional weight loss, skin rash, eye pain, sinus congestion and sinus pain, sore throat, dysphagia,  hemoptysis , cough, dyspnea, wheezing, chest pain, palpitations, orthopnea, edema, abdominal pain, nausea, melena, diarrhea, constipation, flank pain, dysuria, hematuria, urinary  Frequency, nocturia, numbness, tingling, seizures,  Focal weakness, Loss of consciousness,  Tremor, insomnia, depression, anxiety, and suicidal ideation.     History   Social History  . Marital Status: Married    Spouse Name: N/A  . Number of Children: N/A  . Years of Education: 16   Occupational History  .     Social History Main Topics  . Smoking status: Never Smoker   . Smokeless tobacco: Former Systems developer  Quit date: 07/05/1956  . Alcohol Use: No  . Drug Use: No  . Sexual Activity: No   Other Topics Concern  . Not on file   Social History Narrative    Objective:  Filed Vitals:   12/29/14 1523  BP: 120/50  Pulse: 58  Temp: 98.7 F (37.1 C)  Resp: 14     General appearance: alert, cooperative and appears stated age Ears: normal TM's and external ear canals both ears Throat: lips, mucosa, and tongue normal; teeth and gums normal Neck: no adenopathy, no carotid bruit, supple, symmetrical, trachea midline and thyroid not enlarged, symmetric, no tenderness/mass/nodules Back: symmetric, no curvature. ROM normal. No CVA  tenderness. Lungs: clear to auscultation bilaterally Heart: regular rate and rhythm, S1, S2 normal, no murmur, click, rub or gallop Abdomen: soft, non-tender; bowel sounds normal; no masses,  no organomegaly Pulses: 2+ and symmetric Skin: Skin color, texture, turgor normal. No rashes or lesions Lymph nodes: Cervical, supraclavicular, and axillary nodes normal.  Assessment and Plan:  Autonomic dysfunction Managed with liberalization of BP control and advice to use compression stockings   Bilateral lower extremity edema Resolved with lasix 20 mg daily x 1 week  Renal function is stable referral for arterial and venous circulation underway as pulses were difficult to palpated and he has PAD>     Updated Medication List Outpatient Encounter Prescriptions as of 12/29/2014  Medication Sig  . acetaminophen (TYLENOL) 500 MG tablet Take 500 mg by mouth at bedtime.  Marland Kitchen aspirin 81 MG tablet Take 81 mg by mouth daily.    Marland Kitchen atenolol (TENORMIN) 25 MG tablet Take 0.5 tablets (12.5 mg total) by mouth 2 (two) times daily.  Marland Kitchen atorvastatin (LIPITOR) 10 MG tablet Take 1 tablet (10 mg total) by mouth daily.  . calcium carbonate (OS-CAL) 600 MG TABS Take 600 mg by mouth daily.  . ciclopirox (LOPROX) 0.77 % cream Apply topically as needed.    . citalopram (CELEXA) 20 MG tablet TAKE ONE TABLET BY MOUTH EVERY DAY  . diphenhydrAMINE (BENADRYL) 25 mg capsule Take 25 mg by mouth every 8 (eight) hours as needed.  . donepezil (ARICEPT) 5 MG tablet Take 1 tablet (5 mg total) by mouth at bedtime.  . enalapril (VASOTEC) 2.5 MG tablet Take 1 tablet (2.5 mg total) by mouth daily.  . fluocinonide (LIDEX) 0.05 % cream Apply 1 application topically as needed.    . furosemide (LASIX) 20 MG tablet Take 1 tablet (20 mg total) by mouth daily.  . hydrocortisone (WESTCORT) 0.2 % cream Apply 1 application topically as needed.    . lactulose (CHRONULAC) 10 GM/15ML solution TAKE 30MLS (20 G TOTAL) BY MOUTH EVERY 6HOURS AS NEEDED  TO RELIEVE CONSTIPATION  . Multiple Vitamins-Minerals (PRESERVISION AREDS PO) Take by mouth.    . nystatin (MYCOSTATIN) powder Apply topically 2 (two) times daily.     Orders Placed This Encounter  Procedures  . Basic metabolic panel  . Ambulatory referral to Vascular Surgery    No Follow-up on file.

## 2014-12-30 LAB — BASIC METABOLIC PANEL WITH GFR
BUN: 23 mg/dL (ref 6–23)
CO2: 31 meq/L (ref 19–32)
Calcium: 9.4 mg/dL (ref 8.4–10.5)
Chloride: 102 meq/L (ref 96–112)
Creatinine, Ser: 1.01 mg/dL (ref 0.40–1.50)
GFR: 74 mL/min
Glucose, Bld: 93 mg/dL (ref 70–99)
Potassium: 4.2 meq/L (ref 3.5–5.1)
Sodium: 139 meq/L (ref 135–145)

## 2014-12-30 NOTE — Assessment & Plan Note (Signed)
Managed with liberalization of BP control and advice to use compression stockings

## 2014-12-30 NOTE — Assessment & Plan Note (Signed)
Resolved with lasix 20 mg daily x 1 week  Renal function is stable referral for arterial and venous circulation underway as pulses were difficult to palpated and he has PAD>

## 2015-01-02 ENCOUNTER — Encounter: Payer: Self-pay | Admitting: *Deleted

## 2015-02-06 ENCOUNTER — Ambulatory Visit: Payer: Medicare Other | Admitting: Internal Medicine

## 2015-02-06 ENCOUNTER — Ambulatory Visit (INDEPENDENT_AMBULATORY_CARE_PROVIDER_SITE_OTHER): Payer: Medicare Other | Admitting: Internal Medicine

## 2015-02-06 ENCOUNTER — Encounter (INDEPENDENT_AMBULATORY_CARE_PROVIDER_SITE_OTHER): Payer: Self-pay

## 2015-02-06 VITALS — BP 130/62 | HR 66 | Temp 98.4°F | Resp 14 | Ht 69.0 in | Wt 164.0 lb

## 2015-02-06 DIAGNOSIS — R300 Dysuria: Secondary | ICD-10-CM

## 2015-02-06 DIAGNOSIS — G909 Disorder of the autonomic nervous system, unspecified: Secondary | ICD-10-CM

## 2015-02-06 DIAGNOSIS — I6522 Occlusion and stenosis of left carotid artery: Secondary | ICD-10-CM | POA: Diagnosis not present

## 2015-02-06 DIAGNOSIS — R609 Edema, unspecified: Secondary | ICD-10-CM

## 2015-02-06 DIAGNOSIS — I10 Essential (primary) hypertension: Secondary | ICD-10-CM | POA: Diagnosis not present

## 2015-02-06 LAB — POCT URINALYSIS DIPSTICK
GLUCOSE UA: NEGATIVE
Leukocytes, UA: NEGATIVE
Nitrite, UA: NEGATIVE
PH UA: 5.5
Protein, UA: 100
SPEC GRAV UA: 1.025
Urobilinogen, UA: 0.2

## 2015-02-06 LAB — URINALYSIS, ROUTINE W REFLEX MICROSCOPIC
Bilirubin Urine: NEGATIVE
Ketones, ur: NEGATIVE
LEUKOCYTES UA: NEGATIVE
NITRITE: NEGATIVE
PH: 6 (ref 5.0–8.0)
Specific Gravity, Urine: >=1.03 — AB (ref 1.000–1.030)
TOTAL PROTEIN, URINE-UPE24: 100 — AB
UROBILINOGEN UA: 0.2 (ref 0.0–1.0)
Urine Glucose: NEGATIVE

## 2015-02-06 NOTE — Patient Instructions (Addendum)
You are taking medications for blood pressure, cholesterol,  Memory,  And depression   You do NOT have a urinary tract infection   Your dizziness is coming from changes in your blood pressure when you stand up  We are going to get you some compression knee high stockings to help keep your blood pressure from dropping and deal with the fluid in your legs

## 2015-02-06 NOTE — Progress Notes (Signed)
Pre visit review using our clinic review tool, if applicable. No additional management support is needed unless otherwise documented below in the visit note. 

## 2015-02-06 NOTE — Progress Notes (Signed)
Subjective:  Patient ID: Derrick Sullivan, male    DOB: 01/22/1924  Age: 81 y.o. MRN: [id number]  CC: The primary encounter diagnosis was Carotid stenosis, left. Diagnoses of Dysuria, Edema, Essential hypertension, and Autonomic dysfunction were also pertinent to this visit.  HPI Derrick Sullivan presents for recurrent episodes of dizziness with position change, elevated blood pressure and weight loss.  He was last seen one month ago . Was referred to [facility] for decreased pulses in feet, bilateral edema.  Neither the patient nor the caregiver can tell me if he has been seen yet. He denies headaches and true vertigo. Review of his home blood pressures shows that most are in the normal range and there are no lows.       Outpatient Prescriptions Prior to Visit  Medication Sig Dispense Refill  . acetaminophen (TYLENOL) 500 MG tablet Take 500 mg by mouth at bedtime.    Marland Kitchen aspirin 81 MG tablet Take 81 mg by mouth daily.      Marland Kitchen atenolol (TENORMIN) 25 MG tablet Take 0.5 tablets (12.5 mg total) by mouth 2 (two) times daily. 90 tablet 1  . atorvastatin (LIPITOR) 10 MG tablet Take 1 tablet (10 mg total) by mouth daily. 90 tablet 3  . calcium carbonate (OS-CAL) 600 MG TABS Take 600 mg by mouth daily.    . ciclopirox (LOPROX) 0.77 % cream Apply topically as needed.      . citalopram (CELEXA) 20 MG tablet TAKE ONE TABLET BY MOUTH EVERY DAY 90 tablet 3  . diphenhydrAMINE (BENADRYL) 25 mg capsule Take 25 mg by mouth every 8 (eight) hours as needed.    . donepezil (ARICEPT) 5 MG tablet Take 1 tablet (5 mg total) by mouth at bedtime. 30 tablet 1  . enalapril (VASOTEC) 2.5 MG tablet Take 1 tablet (2.5 mg total) by mouth daily. 45 tablet 1  . fluocinonide (LIDEX) 0.05 % cream Apply 1 application topically as needed.      . furosemide (LASIX) 20 MG tablet Take 1 tablet (20 mg total) by mouth daily. 30 tablet 3  . hydrocortisone (WESTCORT) 0.2 % cream Apply 1 application topically as needed.      . lactulose  (CHRONULAC) 10 GM/15ML solution TAKE 30MLS (20 G TOTAL) BY MOUTH EVERY 6HOURS AS NEEDED TO RELIEVE CONSTIPATION 240 mL 1  . Multiple Vitamins-Minerals (PRESERVISION AREDS PO) Take by mouth.      . nystatin (MYCOSTATIN) powder Apply topically 2 (two) times daily. 15 g 3   No facility-administered medications prior to visit.    Review of Systems;  Patient denies headache, fevers, malaise, unintentional weight loss, skin rash, eye pain, sinus congestion and sinus pain, sore throat, dysphagia,  hemoptysis , cough, dyspnea, wheezing, chest pain, palpitations, orthopnea, edema, abdominal pain, nausea, melena, diarrhea, constipation, flank pain, dysuria, hematuria, urinary  Frequency, nocturia, numbness, tingling, seizures,  Focal weakness, Loss of consciousness,  Tremor, insomnia, depression, anxiety, and suicidal ideation.      Objective:  BP 130/62 mmHg  Pulse 66  Temp(Src) 98.4 F (36.9 C) (Oral)  Resp 14  Ht 5\' 9"  (1.753 m)  Wt 164 lb (74.39 kg)  BMI 24.21 kg/m2  SpO2 97%  BP Readings from Last 3 Encounters:  02/06/15 130/62  12/29/14 120/50  12/22/14 110/58    Wt Readings from Last 3 Encounters:  02/06/15 164 lb (74.39 kg)  12/29/14 159 lb 8 oz (72.349 kg)  12/22/14 165 lb 12 oz (75.184 kg)    General appearance: alert,  cooperative and appears stated age Ears: normal TM's and external ear canals both ears Throat: lips, mucosa, and tongue normal; teeth and gums normal Neck: no adenopathy, no carotid bruit, supple, symmetrical, trachea midline and thyroid not enlarged, symmetric, no tenderness/mass/nodules Back: symmetric, no curvature. ROM normal. No CVA tenderness. Lungs: clear to auscultation bilaterally Heart: regular rate and rhythm, S1, S2 normal, no murmur, click, rub or gallop Abdomen: soft, non-tender; bowel sounds normal; no masses,  no organomegaly Pulses: 2+ and symmetric Skin: Skin color, texture, turgor normal. No rashes or lesions Lymph nodes: Cervical,  supraclavicular, and axillary nodes normal.  Lab Results  Component Value Date   HGBA1C 6.2 07/28/2014   HGBA1C 6.4 11/06/2013   HGBA1C 6.4 03/14/2013    Lab Results  Component Value Date   CREATININE 1.01 12/29/2014   CREATININE 1.19 12/22/2014   CREATININE 1.0 07/28/2014    Lab Results  Component Value Date   WBC 8.1 12/22/2014   HGB 12.3* 12/22/2014   HCT 35.8* 12/22/2014   PLT 201.0 12/22/2014   GLUCOSE 93 12/29/2014   CHOL 165 11/06/2013   TRIG 54.0 11/06/2013   HDL 35.40* 11/06/2013   LDLDIRECT 74.3 03/14/2013   LDLCALC 119* 11/06/2013   ALT 27 12/22/2014   AST 19 12/22/2014   NA 139 12/29/2014   K 4.2 12/29/2014   CL 102 12/29/2014   CREATININE 1.01 12/29/2014   BUN 23 12/29/2014   CO2 31 12/29/2014   TSH 1.53 04/28/2014   HGBA1C 6.2 07/28/2014   MICROALBUR 63.4* 12/22/2014    No results found.  Assessment & Plan:   Problem List Items Addressed This Visit    Carotid stenosis, left - Primary    He had subcritical  stenosis by Octoer 2015 ultrasound, so it is unlikely that critical stenosis is the cause of his dizziness.  Continue aspirin  and statin       Hypertension    Well controlled on current regimen. Renal function stable, no changes today.      Autonomic dysfunction    Managed with liberalization of BP control and patient again  advised  to use compression stockings         Other Visit Diagnoses    Dysuria        Relevant Orders    POCT Urinalysis Dipstick (Completed)    Urinalysis, Routine w reflex microscopic (Completed)    CULTURE, URINE COMPREHENSIVE (Completed)    Edema        Relevant Orders    For home use only DME Other see comment       I am having Derrick Sullivan maintain his aspirin, fluocinonide cream, ciclopirox, hydrocortisone valerate cream, Multiple Vitamins-Minerals (PRESERVISION AREDS PO), calcium carbonate, diphenhydrAMINE, acetaminophen, donepezil, atorvastatin, nystatin, lactulose, citalopram, enalapril, atenolol,  and furosemide.  No orders of the defined types were placed in this encounter.    There are no discontinued medications.  Follow-up: Return in about 3 months (around 05/09/2015).   Crecencio Mc, MD

## 2015-02-08 LAB — CULTURE, URINE COMPREHENSIVE
Colony Count: NO GROWTH
ORGANISM ID, BACTERIA: NO GROWTH

## 2015-02-08 NOTE — Assessment & Plan Note (Addendum)
He had subcritical  stenosis by Octoer 2015 ultrasound, so it is unlikely that critical stenosis is the cause of his dizziness.  Continue aspirin  and statin

## 2015-02-08 NOTE — Assessment & Plan Note (Signed)
Managed with liberalization of BP control and patient again  advised  to use compression stockings

## 2015-02-08 NOTE — Assessment & Plan Note (Signed)
Well controlled on current regimen. Renal function stable, no changes today. 

## 2015-02-26 ENCOUNTER — Telehealth: Payer: Self-pay | Admitting: Internal Medicine

## 2015-02-26 NOTE — Telephone Encounter (Signed)
Derrick Sullivan nurse from [place] called and ask for referral to [facility] for skilled nursing for patient medication, That patient is receiving PT from [facility]. Nurse also wanted to advise that Dr. Melrose Nakayama has started patient on Namenda 5 mg BID. Medication list has been updated. Please advise if referral can be made for  Medication administration to [facility].  [facility] states they don not supply this benefit that they are non medical, that this service is being provided as a favor. The weekly weight also for furosemide.

## 2015-02-26 NOTE — Telephone Encounter (Signed)
Currently working on referrals.

## 2015-02-26 NOTE — Telephone Encounter (Signed)
Yes you can request this skilled nursing service for the [facility] from [facility]

## 2015-02-27 NOTE — Telephone Encounter (Signed)
I placed referrals for both in red folder need DX. Signature

## 2015-03-03 NOTE — Telephone Encounter (Signed)
Faxed [facility] for [facility] administration. 

## 2015-04-02 ENCOUNTER — Telehealth: Payer: Self-pay | Admitting: Internal Medicine

## 2015-04-02 NOTE — Telephone Encounter (Signed)
I do not understand the last part of your note. This sounds like a "turf war" between two home health agencies,  And I do not want to be in the middle. I thought that [facility] does not, provide PT or fill the medication /pill boxes, correct?   just home health aide.  On Monday can you call Dudley Major from [facility] and find out what is going on?

## 2015-04-02 NOTE — Telephone Encounter (Signed)
Peyton Bottoms called from [facility] to advise if PT and all services from [facility] could be stopped except for medication management. Stated that the frequent visit are upsetting Mr. Go.  Ms. Arbie Cookey stated that all was needed is to fill medication box advised, Ms. Arbie Cookey at the time of referral that it could not be just to fill medication box.

## 2015-04-03 NOTE — Telephone Encounter (Signed)
Left message for Keri to call office.

## 2015-04-03 NOTE — Telephone Encounter (Signed)
Talked with Keri at [facility] and she stated she will handle, By coordinating care and contacting [facility]. FYI

## 2015-04-22 ENCOUNTER — Other Ambulatory Visit: Payer: Self-pay | Admitting: Internal Medicine

## 2015-05-04 ENCOUNTER — Ambulatory Visit (INDEPENDENT_AMBULATORY_CARE_PROVIDER_SITE_OTHER): Payer: Medicare Other

## 2015-05-04 VITALS — BP 158/76 | HR 62 | Temp 98.3°F | Resp 14 | Ht 69.0 in | Wt 163.2 lb

## 2015-05-04 DIAGNOSIS — Z0000 Encounter for general adult medical examination without abnormal findings: Secondary | ICD-10-CM | POA: Diagnosis not present

## 2015-05-04 NOTE — Progress Notes (Signed)
Subjective:   Derrick Sullivan is a 81 y.o. male who presents for Medicare Annual/Subsequent preventive examination.  Review of Systems:  No ROS.  Medicare Wellness Visit.  Cardiac Risk Factors include: advanced age (>39men, >53 women);male gender     Objective:     The goal of the wellness visit is to assist the patient how to close the gaps in care and create a preventative care plan for the patient. This is a routine visit for Mr. Derrick Sullivan.  Vitals: BP 158/76 mmHg  Pulse 62  Temp(Src) 98.3 F (36.8 C) (Oral)  Resp 14  Ht 5\' 9"  (1.753 m)  Wt 163 lb 3.2 oz (74.027 kg)  BMI 24.09 kg/m2  SpO2 97%  Tobacco History  Smoking status  . Never Smoker   Smokeless tobacco  . Former Systems developer  . Quit date: 07/05/1956     Counseling given: Not Answered   Past Medical History  Diagnosis Date  . CAD (coronary artery disease)   . Peripheral vascular disease   . Degenerative disk disease   . Presbyacusis   . Osteoporosis   . Duodenal ulcer 07/2004    NSAID induced  . Hyperlipidemia   . Benign prostatic hypertrophy   . Benign meningioma 2006    s/p parietal resection  . Osteopenia   . Carotid artery stenosis, asymptomatic 2008    hemodynamically insignificant  . Diverticulosis of colon 2007  . Hypertension 09/26/2011   Past Surgical History  Procedure Laterality Date  . Shoulder surgery  2000    rotator cuff  . Gallbladder surgery  2000  . Brain surgery      meningioma resection   Family History  Problem Relation Age of Onset  . Diabetes Mother   . Heart disease Father    History  Sexual Activity  . Sexual Activity: No    Outpatient Encounter Prescriptions as of 05/04/2015  Medication Sig  . acetaminophen (TYLENOL) 500 MG tablet Take 500 mg by mouth at bedtime.  Marland Kitchen aspirin 81 MG tablet Take 81 mg by mouth daily.    Marland Kitchen atenolol (TENORMIN) 25 MG tablet Take 0.5 tablets (12.5 mg total) by mouth 2 (two) times daily.  Marland Kitchen atorvastatin (LIPITOR) 10 MG tablet Take 1 tablet  (10 mg total) by mouth daily.  . calcium carbonate (OS-CAL) 600 MG TABS Take 600 mg by mouth daily.  . ciclopirox (LOPROX) 0.77 % cream Apply topically as needed.    . citalopram (CELEXA) 20 MG tablet TAKE ONE TABLET BY MOUTH EVERY DAY  . diphenhydrAMINE (BENADRYL) 25 mg capsule Take 25 mg by mouth every 8 (eight) hours as needed.  . donepezil (ARICEPT) 5 MG tablet Take 1 tablet (5 mg total) by mouth at bedtime.  . enalapril (VASOTEC) 2.5 MG tablet TAKE ONE TABLET BY MOUTH EVERY DAY  . fluocinonide (LIDEX) 0.05 % cream Apply 1 application topically as needed.    . furosemide (LASIX) 20 MG tablet Take 1 tablet (20 mg total) by mouth daily.  . hydrocortisone (WESTCORT) 0.2 % cream Apply 1 application topically as needed.    . lactulose (CHRONULAC) 10 GM/15ML solution TAKE 30MLS (20 G TOTAL) BY MOUTH EVERY 6HOURS AS NEEDED TO RELIEVE CONSTIPATION  . memantine (NAMENDA) 5 MG tablet Take 5 mg by mouth 2 (two) times daily.  . Multiple Vitamins-Minerals (PRESERVISION AREDS PO) Take by mouth.    . nystatin (MYCOSTATIN) powder Apply topically 2 (two) times daily.   No facility-administered encounter medications on file as of 05/04/2015.  Activities of Daily Living In your present state of health, do you have any difficulty performing the following activities: 05/04/2015  Hearing? Y  Vision? Y  Difficulty concentrating or making decisions? Y  Walking or climbing stairs? Y  Dressing or bathing? N  Doing errands, shopping? Y  Preparing Food and eating ? Y  Using the Toilet? N  In the past six months, have you accidently leaked urine? Y  Do you have problems with loss of bowel control? N  Managing your Medications? Y  Managing your Finances? Y  Housekeeping or managing your Housekeeping? Y    Patient Care Team: Derrick Mc, MD as PCP - General (Internal Medicine)   Assessment:   Overdue Health Maintenance:  A1C Labs due at follow up  Diabetic Eye Exam referral placed  Colonoscopy  referral placed  No Risk for hepatitis or high risk social behavior identified via hepatitis screen  Educated on Vaccines; TDAP and Influenza postponed, per patient request for follow up PCP.  Safety issues reviewed; Smoke detectors in the home.  No firearms in the home.  Wears seatbelts when riding with others.  No violence in the home.  Diabetes Mellitus stable and followed by PCP.  Alzheimer's Dementia with Behavioral Disturbance stable and followed by PCP.  Benign Meningioma stable and followed by PCP  Diabetes Mellitus with peripheral Artery Disease stable and followed by PCP.  End of life planning was discussed; plans to bring completed HCPOA.   Exercise Activities and Dietary recommendations Current Exercise Habits:: Home exercise routine, Type of exercise: walking, Time (Minutes): 30, Frequency (Times/Week): 6, Weekly Exercise (Minutes/Week): 180, Intensity: Mild  Goals    . Increase water intake     Patient centered goal is to increase water intake.  Currently drinks 2 glasses daily.  Goal is to drink 6-8 glasses per day.      Fall Risk Fall Risk  05/04/2015 04/29/2014 04/28/2014 10/02/2012  Falls in the past year? Yes Yes Yes No  Number falls in past yr: 1 1 1  -  Injury with Fall? No No - -  Risk for fall due to : - Impaired balance/gait - History of fall(s)  Follow up Falls prevention discussed;Education provided - - -   Depression Screen PHQ 2/9 Scores 05/04/2015 04/29/2014 04/28/2014 10/02/2012  PHQ - 2 Score 0 2 0 0  PHQ- 9 Score - 3 - -    Cognitive Testing MMSE - Mini Mental State Exam 05/04/2015  Orientation to time 5  Orientation to Place 5  Registration 3  Attention/ Calculation 5  Recall 2  Language- name 2 objects 2  Language- repeat 1  Language- follow 3 step command 3  Language- read & follow direction 1  Write a sentence 1  Copy design 1  Total score 29    Immunization History  Administered Date(s) Administered  . Influenza Split 07/04/2013    . Influenza,inj,Quad PF,36+ Mos 06/25/2014  . Pneumococcal Conjugate-13 07/28/2014  . Pneumococcal Polysaccharide-23 06/12/2013  . Zoster 08/01/2006   Screening Tests Health Maintenance  Topic Date Due  . COLONOSCOPY  06/08/1976  . OPHTHALMOLOGY EXAM  05/14/2014  . HEMOGLOBIN A1C  01/26/2015  . INFLUENZA VACCINE  05/13/2015 (Originally 04/06/2015)  . TETANUS/TDAP  05/13/2015 (Originally 06/08/1943)  . FOOT EXAM  07/29/2015  . URINE MICROALBUMIN  12/22/2015  . ZOSTAVAX  Completed  . PNA vac Low Risk Adult  Completed      Plan:    During the course of the visit the patient  was educated and counseled about the following appropriate screening and preventive services:   Vaccines to include Pneumoccal, Influenza, Hepatitis B, Td, Zostavax, HCV  Electrocardiogram  Cardiovascular Disease  Colorectal cancer screening  Diabetes screening  Prostate Cancer Screening  Glaucoma screening  Nutrition counseling   Smoking cessation counseling  Patient Instructions (the written plan) was given to the patient.    Varney Biles, LPN  [id number]

## 2015-05-04 NOTE — Patient Instructions (Signed)
Derrick Sullivan,  Thank you for taking time to come for your Medicare Wellness Visit.  I appreciate your ongoing commitment to your health goals. Please review the following plan we discussed and let me know if I can assist you in the future.  Bring HCPOA to next follow   Colonoscopy referral placed  Flu shot at follow up

## 2015-05-13 ENCOUNTER — Encounter: Payer: Self-pay | Admitting: Internal Medicine

## 2015-05-13 ENCOUNTER — Ambulatory Visit (INDEPENDENT_AMBULATORY_CARE_PROVIDER_SITE_OTHER): Payer: Medicare Other | Admitting: Internal Medicine

## 2015-05-13 VITALS — BP 158/60 | HR 47 | Temp 98.3°F | Resp 14 | Ht 69.0 in | Wt 162.0 lb

## 2015-05-13 DIAGNOSIS — G309 Alzheimer's disease, unspecified: Secondary | ICD-10-CM

## 2015-05-13 DIAGNOSIS — F0281 Dementia in other diseases classified elsewhere with behavioral disturbance: Secondary | ICD-10-CM

## 2015-05-13 DIAGNOSIS — R2681 Unsteadiness on feet: Secondary | ICD-10-CM

## 2015-05-13 DIAGNOSIS — Z7189 Other specified counseling: Secondary | ICD-10-CM

## 2015-05-13 DIAGNOSIS — G308 Other Alzheimer's disease: Secondary | ICD-10-CM

## 2015-05-13 DIAGNOSIS — I10 Essential (primary) hypertension: Secondary | ICD-10-CM | POA: Diagnosis not present

## 2015-05-13 DIAGNOSIS — F02818 Dementia in other diseases classified elsewhere, unspecified severity, with other behavioral disturbance: Secondary | ICD-10-CM

## 2015-05-13 MED ORDER — ENALAPRIL MALEATE 5 MG PO TABS: 5.0000 mg | ORAL_TABLET | Freq: Every day | ORAL | Status: DC

## 2015-05-13 NOTE — Progress Notes (Addendum)
Subjective:  Patient ID: Derrick Sullivan, male    DOB: Dec 05, 1923  Age: 81 y.o. MRN: [id number]  CC: The primary encounter diagnosis was Do not resuscitate discussion. Diagnoses of Alzheimer's dementia with behavioral disturbance, Unsteady gait, and Essential hypertension were also pertinent to this visit.  HPI [name] presents for 3 month follow up ,  Last seen end of June.  Patient is well dressed ,  In good spirits, but his memory loss has progressed,  He does not recall my name today and reportedly did not recognize me today,  Does not remember his wife's dermatologic procedures yesterday that have left her face looking "beat up"   Caregiver reports BP in the 626 to 948 systolic range.  [facility] RN noted proteinuria during his wellness visit , ,    No recent falls,  Walks a lot daily around the house, Crosses a busy street to get the mail . Caregiver goes with him but he resists the supervision and becomes irritated. .     Outpatient Prescriptions Prior to Visit  Medication Sig Dispense Refill  . acetaminophen (TYLENOL) 500 MG tablet Take 500 mg by mouth at bedtime.    Marland Kitchen aspirin 81 MG tablet Take 81 mg by mouth daily.      Marland Kitchen atenolol (TENORMIN) 25 MG tablet Take 0.5 tablets (12.5 mg total) by mouth 2 (two) times daily. 90 tablet 1  . atorvastatin (LIPITOR) 10 MG tablet Take 1 tablet (10 mg total) by mouth daily. 90 tablet 3  . calcium carbonate (OS-CAL) 600 MG TABS Take 600 mg by mouth daily.    . ciclopirox (LOPROX) 0.77 % cream Apply topically as needed.      . citalopram (CELEXA) 20 MG tablet TAKE ONE TABLET BY MOUTH EVERY DAY 90 tablet 3  . diphenhydrAMINE (BENADRYL) 25 mg capsule Take 25 mg by mouth every 8 (eight) hours as needed.    . donepezil (ARICEPT) 5 MG tablet Take 1 tablet (5 mg total) by mouth at bedtime. 30 tablet 1  . fluocinonide (LIDEX) 0.05 % cream Apply 1 application topically as needed.      . furosemide (LASIX) 20 MG tablet Take 1 tablet  (20 mg total) by mouth daily. 30 tablet 3  . hydrocortisone (WESTCORT) 0.2 % cream Apply 1 application topically as needed.      . memantine (NAMENDA) 5 MG tablet Take 5 mg by mouth 2 (two) times daily.    . Multiple Vitamins-Minerals (PRESERVISION AREDS PO) Take by mouth.      . nystatin (MYCOSTATIN) powder Apply topically 2 (two) times daily. 15 g 3  . enalapril (VASOTEC) 2.5 MG tablet TAKE ONE TABLET BY MOUTH EVERY DAY 45 tablet 4  . lactulose (CHRONULAC) 10 GM/15ML solution TAKE 30MLS (20 G TOTAL) BY MOUTH EVERY 6HOURS AS NEEDED TO RELIEVE CONSTIPATION (Patient not taking: Reported on 05/13/2015) 240 mL 1   No facility-administered medications prior to visit.    Review of Systems;  Patient denies headache, fevers, malaise, unintentional weight loss, skin rash, eye pain, sinus congestion and sinus pain, sore throat, dysphagia,  hemoptysis , cough, dyspnea, wheezing, chest pain, palpitations, orthopnea, edema, abdominal pain, nausea, melena, diarrhea, constipation, flank pain, dysuria, hematuria, urinary  Frequency, nocturia, numbness, tingling, seizures,  Focal weakness, Loss of consciousness,  Tremor, insomnia, depression, anxiety, and suicidal ideation.      Objective:  BP 158/60 mmHg  Pulse 47  Temp(Src) 98.3 F (36.8 C) (Oral)  Resp 14  Ht 5\' 9"  (1.753 m)  Wt 162 lb (73.483 kg)  BMI 23.91 kg/m2  SpO2 98%  BP Readings from Last 3 Encounters:  05/13/15 158/60  05/04/15 158/76  02/06/15 130/62    Wt Readings from Last 3 Encounters:  05/13/15 162 lb (73.483 kg)  05/04/15 163 lb 3.2 oz (74.027 kg)  02/06/15 164 lb (74.39 kg)    General appearance: alert, cooperative and appears stated age Ears: normal TM's and external ear canals both ears Throat: lips, mucosa, and tongue normal; teeth and gums normal Neck: no adenopathy, no carotid bruit, supple, symmetrical, trachea midline and thyroid not enlarged, symmetric, no tenderness/mass/nodules Back: symmetric, no curvature. ROM  normal. No CVA tenderness. Lungs: clear to auscultation bilaterally Heart: regular rate and rhythm, S1, S2 normal, no murmur, click, rub or gallop Abdomen: soft, non-tender; bowel sounds normal; no masses,  no organomegaly Pulses: 2+ and symmetric Skin: Skin color, texture, turgor normal. No rashes or lesions Lymph nodes: Cervical, supraclavicular, and axillary nodes normal.  Lab Results  Component Value Date   HGBA1C 6.2 07/28/2014   HGBA1C 6.4 11/06/2013   HGBA1C 6.4 03/14/2013    Lab Results  Component Value Date   CREATININE 1.01 12/29/2014   CREATININE 1.19 12/22/2014   CREATININE 1.0 07/28/2014    Lab Results  Component Value Date   WBC 8.1 12/22/2014   HGB 12.3* 12/22/2014   HCT 35.8* 12/22/2014   PLT 201.0 12/22/2014   GLUCOSE 93 12/29/2014   CHOL 165 11/06/2013   TRIG 54.0 11/06/2013   HDL 35.40* 11/06/2013   LDLDIRECT 74.3 03/14/2013   LDLCALC 119* 11/06/2013   ALT 27 12/22/2014   AST 19 12/22/2014   NA 139 12/29/2014   K 4.2 12/29/2014   CL 102 12/29/2014   CREATININE 1.01 12/29/2014   BUN 23 12/29/2014   CO2 31 12/29/2014   TSH 1.53 04/28/2014   HGBA1C 6.2 07/28/2014   MICROALBUR 63.4* 12/22/2014    No results found.  Assessment & Plan:   Problem List Items Addressed This Visit      Unprioritized   Alzheimer's dementia with behavioral disturbance    His dementia is progressing but he continues to stay active, conversational  and his nighttime agitation is controlled .  DNR orders discussed and signed today      Hypertension    Not at goal,,  Enalapril dose increased,       Relevant Medications   enalapril (VASOTEC) 5 MG tablet   Unsteady gait    No falls recently ,  Advised supervised walks when crossing the street.       Do not resuscitate discussion - Primary     End of Life objectives were discussed at length,  Patient does  have a living will in place or a healthcare power of attorney.  /DNR status was obtained for patiinet and  wife      Relevant Orders   DNR (Do Not Resuscitate)    A total of 25 minutes of face to face time was spent with patient more than half of which was spent in counselling about the above mentioned conditions  and coordination of care   I have changed Mr. Geesey enalapril. I am also having him maintain his aspirin, fluocinonide cream, ciclopirox, hydrocortisone valerate cream, Multiple Vitamins-Minerals (PRESERVISION AREDS PO), calcium carbonate, diphenhydrAMINE, acetaminophen, donepezil, atorvastatin, nystatin, lactulose, citalopram, atenolol, furosemide, and memantine.  Meds ordered this encounter  Medications  . enalapril (VASOTEC) 5 MG tablet    Sig: Take  1 tablet (5 mg total) by mouth daily.    Dispense:  90 tablet    Refill:  1    Medications Discontinued During This Encounter  Medication Reason  . enalapril (VASOTEC) 2.5 MG tablet Reorder    Follow-up: Return in about 3 months (around 08/12/2015).   Crecencio Mc, MD

## 2015-05-13 NOTE — Progress Notes (Signed)
Pre-visit discussion using our clinic review tool. No additional management support is needed unless otherwise documented below in the visit note.  

## 2015-05-13 NOTE — Patient Instructions (Addendum)
I am increasing your enalapril dose to 5 mg daily for your blood pressure   I have signed "Do Not Resuscitate" (DNR) orders for you and Mrs Ihde  Please display these in your home so that EMS knows about them if you call them.

## 2015-05-14 NOTE — Assessment & Plan Note (Signed)
Not at goal,,  Enalapril dose increased,

## 2015-05-14 NOTE — Assessment & Plan Note (Signed)
End of Life objectives were discussed at length,  Patient does  have a living will in place or a healthcare power of attorney.  /DNR status was obtained for patiinet and wife

## 2015-05-14 NOTE — Assessment & Plan Note (Signed)
No falls recently ,  Advised supervised walks when crossing the street.

## 2015-05-14 NOTE — Assessment & Plan Note (Signed)
His dementia is progressing but he continues to stay active, conversational  and his nighttime agitation is controlled .  DNR orders discussed and signed today

## 2015-05-14 NOTE — Addendum Note (Signed)
Addended by: Crecencio Mc on: 05/14/2015 08:45 PM   Modules accepted: Medications

## 2015-05-28 ENCOUNTER — Other Ambulatory Visit: Payer: Self-pay | Admitting: Internal Medicine

## 2015-08-12 ENCOUNTER — Ambulatory Visit (INDEPENDENT_AMBULATORY_CARE_PROVIDER_SITE_OTHER): Payer: Medicare Other | Admitting: Internal Medicine

## 2015-08-12 ENCOUNTER — Encounter: Payer: Self-pay | Admitting: Internal Medicine

## 2015-08-12 VITALS — BP 138/60 | HR 49 | Temp 97.6°F | Resp 12 | Ht 69.0 in | Wt 156.5 lb

## 2015-08-12 DIAGNOSIS — K5904 Chronic idiopathic constipation: Secondary | ICD-10-CM

## 2015-08-12 DIAGNOSIS — G308 Other Alzheimer's disease: Secondary | ICD-10-CM

## 2015-08-12 DIAGNOSIS — E559 Vitamin D deficiency, unspecified: Secondary | ICD-10-CM

## 2015-08-12 DIAGNOSIS — Z79899 Other long term (current) drug therapy: Secondary | ICD-10-CM

## 2015-08-12 DIAGNOSIS — Z23 Encounter for immunization: Secondary | ICD-10-CM

## 2015-08-12 DIAGNOSIS — R42 Dizziness and giddiness: Secondary | ICD-10-CM

## 2015-08-12 DIAGNOSIS — E785 Hyperlipidemia, unspecified: Secondary | ICD-10-CM

## 2015-08-12 DIAGNOSIS — E1151 Type 2 diabetes mellitus with diabetic peripheral angiopathy without gangrene: Secondary | ICD-10-CM

## 2015-08-12 DIAGNOSIS — F02818 Dementia in other diseases classified elsewhere, unspecified severity, with other behavioral disturbance: Secondary | ICD-10-CM

## 2015-08-12 DIAGNOSIS — G309 Alzheimer's disease, unspecified: Secondary | ICD-10-CM

## 2015-08-12 DIAGNOSIS — I10 Essential (primary) hypertension: Secondary | ICD-10-CM

## 2015-08-12 DIAGNOSIS — K5901 Slow transit constipation: Secondary | ICD-10-CM

## 2015-08-12 DIAGNOSIS — F0281 Dementia in other diseases classified elsewhere with behavioral disturbance: Secondary | ICD-10-CM

## 2015-08-12 LAB — COMPREHENSIVE METABOLIC PANEL
ALBUMIN: 3.7 g/dL (ref 3.5–5.2)
ALK PHOS: 64 U/L (ref 39–117)
ALT: 18 U/L (ref 0–53)
AST: 17 U/L (ref 0–37)
BUN: 28 mg/dL — ABNORMAL HIGH (ref 6–23)
CALCIUM: 9.1 mg/dL (ref 8.4–10.5)
CO2: 30 mEq/L (ref 19–32)
Chloride: 106 mEq/L (ref 96–112)
Creatinine, Ser: 1.19 mg/dL (ref 0.40–1.50)
GFR: 61.16 mL/min (ref >60.00)
Glucose, Bld: 112 mg/dL — ABNORMAL HIGH (ref 70–99)
POTASSIUM: 4.2 meq/L (ref 3.5–5.1)
Sodium: 142 mEq/L (ref 135–145)
TOTAL PROTEIN: 6 g/dL (ref 6.0–8.3)
Total Bilirubin: 0.7 mg/dL (ref 0.2–1.2)

## 2015-08-12 LAB — CBC WITH DIFFERENTIAL/PLATELET
Basophils Absolute: 0 10*3/uL (ref 0.0–0.1)
Basophils Relative: 0.4 % (ref 0.0–3.0)
EOS PCT: 5.4 % — AB (ref 0.0–5.0)
Eosinophils Absolute: 0.4 10*3/uL (ref 0.0–0.7)
HCT: 38.8 % — ABNORMAL LOW (ref 39.0–52.0)
HEMOGLOBIN: 12.9 g/dL — AB (ref 13.0–17.0)
Lymphocytes Relative: 20.7 % (ref 12.0–46.0)
Lymphs Abs: 1.6 10*3/uL (ref 0.7–4.0)
MCHC: 33.3 g/dL (ref 30.0–36.0)
MCV: 95.2 fl (ref 78.0–100.0)
MONOS PCT: 7.4 % (ref 3.0–12.0)
Monocytes Absolute: 0.6 10*3/uL (ref 0.1–1.0)
Neutro Abs: 5.1 10*3/uL (ref 1.4–7.7)
Neutrophils Relative %: 66.1 % (ref 43.0–77.0)
Platelets: 174 10*3/uL (ref 150.0–400.0)
RBC: 4.08 Mil/uL — AB (ref 4.22–5.81)
RDW: 13.7 % (ref 11.5–15.5)
WBC: 7.8 10*3/uL (ref 4.0–10.5)

## 2015-08-12 LAB — VITAMIN D 25 HYDROXY (VIT D DEFICIENCY, FRACTURES): VITD: 35.13 ng/mL (ref 30.00–100.00)

## 2015-08-12 LAB — MAGNESIUM: MAGNESIUM: 2.2 mg/dL (ref 1.5–2.5)

## 2015-08-12 LAB — TSH: TSH: 1.39 u[IU]/mL (ref 0.35–4.50)

## 2015-08-12 LAB — LDL CHOLESTEROL, DIRECT: Direct LDL: 63 mg/dL

## 2015-08-12 MED ORDER — POLYETHYLENE GLYCOL 3350 17 GM/SCOOP PO POWD: 17.0000 g | Freq: Every day | ORAL | Status: DC

## 2015-08-12 MED ORDER — DOCUSATE SODIUM 250 MG PO CAPS: 250.0000 mg | ORAL_CAPSULE | Freq: Every day | ORAL | Status: DC

## 2015-08-12 NOTE — Progress Notes (Signed)
Patient ID: Derrick Sullivan, male    DOB: June 16, 1924  Age: 81 y.o. MRN: [id number]  CC: The primary encounter diagnosis was Constipation by delayed colonic transit. Diagnoses of Dizziness and giddiness, Hyperlipidemia, Long-term use of high-risk medication, Vitamin D deficiency, Encounter for immunization, Alzheimer's dementia with behavioral disturbance, Essential hypertension, Functional constipation, and Diabetes mellitus with peripheral artery disease ([place]) were also pertinent to this visit.  HPI Derrick Sullivan presents for follow up on hypertension, hyperlipidemia and dementia .  Last seen in September .  He is accompanied by caregiver Derrick Sullivan  And wife Derrick Sullivan.  [name] is with them 5 days per week.  Per [name] his memory loss  has been progressing and he has developed urinary incontinence. Caregiver has been hesitant to mandate 2 hr potty breaks. .   Patient complains of constipation but per caregiver  He is having a BM daily. She reports that he is not drinking much water,  drinking diet sodas  He Has lost 6 lbs .  Appetite is small ,  Finishes his meals.  And she has adding snacks.    Outpatient Prescriptions Prior to Visit  Medication Sig Dispense Refill  . acetaminophen (TYLENOL) 500 MG tablet Take 500 mg by mouth at bedtime.    Marland Kitchen aspirin 81 MG tablet Take 81 mg by mouth daily.      Marland Kitchen atenolol (TENORMIN) 25 MG tablet Take 0.5 tablets (12.5 mg total) by mouth 2 (two) times daily. 90 tablet 1  . atorvastatin (LIPITOR) 10 MG tablet TAKE ONE TABLET BY MOUTH EVERY DAY 90 tablet 4  . calcium carbonate (OS-CAL) 600 MG TABS Take 600 mg by mouth daily.    . ciclopirox (LOPROX) 0.77 % cream Apply topically as needed.      . citalopram (CELEXA) 20 MG tablet TAKE ONE TABLET BY MOUTH EVERY DAY 90 tablet 3  . diphenhydrAMINE (BENADRYL) 25 mg capsule Take 25 mg by mouth every 8 (eight) hours as needed.    . donepezil (ARICEPT) 5 MG tablet Take 1 tablet (5 mg total) by mouth at bedtime. 30 tablet 1   . enalapril (VASOTEC) 5 MG tablet Take 1 tablet (5 mg total) by mouth daily. 90 tablet 1  . fluocinonide (LIDEX) 0.05 % cream Apply 1 application topically as needed.      . furosemide (LASIX) 20 MG tablet Take 1 tablet (20 mg total) by mouth daily. 30 tablet 3  . hydrocortisone (WESTCORT) 0.2 % cream Apply 1 application topically as needed.      . lactulose (CHRONULAC) 10 GM/15ML solution TAKE 30MLS (20 G TOTAL) BY MOUTH EVERY 6HOURS AS NEEDED TO RELIEVE CONSTIPATION 240 mL 1  . memantine (NAMENDA) 5 MG tablet Take 5 mg by mouth 2 (two) times daily.    . Multiple Vitamins-Minerals (PRESERVISION AREDS PO) Take by mouth.      . nystatin (MYCOSTATIN) powder Apply topically 2 (two) times daily. 15 g 3   No facility-administered medications prior to visit.    Review of Systems;  Patient denies headache, fevers, malaise, unintentional weight loss, skin rash, eye pain, sinus congestion and sinus pain, sore throat, dysphagia,  hemoptysis , cough, dyspnea, wheezing, chest pain, palpitations, orthopnea, edema, abdominal pain, nausea, melena, diarrhea, constipation, flank pain, dysuria, hematuria, urinary  Frequency, nocturia, numbness, tingling, seizures,  Focal weakness, Loss of consciousness,  Tremor, insomnia, depression, anxiety, and suicidal ideation.      Objective:  BP 138/60 mmHg  Pulse 49  Temp(Src) 97.6 F (  36.4 C) (Oral)  Resp 12  Ht 5\' 9"  (1.753 m)  Wt 156 lb 8 oz (70.988 kg)  BMI 23.10 kg/m2  SpO2 99%  BP Readings from Last 3 Encounters:  08/12/15 138/60  05/13/15 158/60  05/04/15 158/76    Wt Readings from Last 3 Encounters:  08/12/15 156 lb 8 oz (70.988 kg)  05/13/15 162 lb (73.483 kg)  05/04/15 163 lb 3.2 oz (74.027 kg)    General appearance: alert, cooperative and appears stated age Ears: normal TM's and external ear canals both ears Throat: lips, mucosa, and tongue normal; teeth and gums normal Neck: no adenopathy, no carotid bruit, supple, symmetrical, trachea  midline and thyroid not enlarged, symmetric, no tenderness/mass/nodules Back: symmetric, no curvature. ROM normal. No CVA tenderness. Lungs: clear to auscultation bilaterally Heart: regular rate and rhythm, S1, S2 normal, no murmur, click, rub or gallop Abdomen: soft, non-tender; bowel sounds normal; no masses,  no organomegaly Pulses: 2+ and symmetric Skin: Skin color, texture, turgor normal. No rashes or lesions Lymph nodes: Cervical, supraclavicular, and axillary nodes normal.  Lab Results  Component Value Date   HGBA1C 6.2 07/28/2014   HGBA1C 6.4 11/06/2013   HGBA1C 6.4 03/14/2013    Lab Results  Component Value Date   CREATININE 1.19 08/12/2015   CREATININE 1.01 12/29/2014   CREATININE 1.19 12/22/2014    Lab Results  Component Value Date   WBC 7.8 08/12/2015   HGB 12.9* 08/12/2015   HCT 38.8* 08/12/2015   PLT 174.0 08/12/2015   GLUCOSE 112* 08/12/2015   CHOL 165 11/06/2013   TRIG 54.0 11/06/2013   HDL 35.40* 11/06/2013   LDLDIRECT 63.0 08/12/2015   LDLCALC 119* 11/06/2013   ALT 18 08/12/2015   AST 17 08/12/2015   NA 142 08/12/2015   K 4.2 08/12/2015   CL 106 08/12/2015   CREATININE 1.19 08/12/2015   BUN 28* 08/12/2015   CO2 30 08/12/2015   TSH 1.39 08/12/2015   HGBA1C 6.2 07/28/2014   MICROALBUR 63.4* 12/22/2014    No results found.  Assessment & Plan:   Problem List Items Addressed This Visit    Alzheimer's dementia with behavioral disturbance    He has had no behavioral issues recently .  He has developed urinary incontinence.   Recommended bathroom breaks every 2 hours.       Hypertension    reviwed home readings.  Some lability noted,  No changes today given his age and episodic dizziness.       Diabetes mellitus with peripheral artery disease ([place])     Historically well-controlled on diet alone .  hemoglobin A1c has been consistently at or  less than 7.0 . Patient is up-to-date on eye exams and foot exam is normal today. Patient has   microalbuminuria. Patient is tolerating statin therapy for CAD risk reduction and on ACE/ARB for renal protection and hypertension .  Lab Results  Component Value Date   HGBA1C 6.2 07/28/2014   Lab Results  Component Value Date   MICROALBUR 63.4* 12/22/2014   Lab Results  Component Value Date   CHOL 165 11/06/2013   HDL 35.40* 11/06/2013   LDLCALC 119* 11/06/2013   LDLDIRECT 63.0 08/12/2015   TRIG 54.0 11/06/2013   CHOLHDL 5 11/06/2013           Functional constipation    Recommended an increase in fiber and water intake  And daily use of Miralax. And colace.       Other Visit Diagnoses    Constipation by  delayed colonic transit    -  Primary    Relevant Orders    TSH (Completed)    Magnesium (Completed)    Dizziness and giddiness        Relevant Orders    CBC with Differential/Platelet (Completed)    Hyperlipidemia        Relevant Orders    LDL cholesterol, direct (Completed)    Long-term use of high-risk medication        Relevant Orders    Comprehensive metabolic panel (Completed)    Vitamin D deficiency        Relevant Orders    VITAMIN D 25 Hydroxy (Vit-D Deficiency, Fractures) (Completed)    Encounter for immunization           I am having Mr. Iles start on polyethylene glycol powder and docusate sodium. I am also having him maintain his aspirin, fluocinonide cream, ciclopirox, hydrocortisone valerate cream, Multiple Vitamins-Minerals (PRESERVISION AREDS PO), calcium carbonate, diphenhydrAMINE, acetaminophen, donepezil, nystatin, lactulose, citalopram, atenolol, furosemide, memantine, enalapril, and atorvastatin.  Meds ordered this encounter  Medications  . polyethylene glycol powder (GLYCOLAX/MIRALAX) powder    Sig: Take 17 g by mouth daily.    Dispense:  3350 g    Refill:  1  . docusate sodium (COLACE) 250 MG capsule    Sig: Take 1 capsule (250 mg total) by mouth daily.    Dispense:  30 capsule    Refill:  5   A total of 25 minutes of face to  face time was spent with patient more than half of which was spent in counselling and coordination of care   There are no discontinued medications.  Follow-up: Return in about 4 months (around 12/11/2015).   Crecencio Mc, MD

## 2015-08-12 NOTE — Patient Instructions (Addendum)
You should not use bisacodyl , the orange laxative  ,  It is too strong for you and causes diarrhea e,   You can take miralax every night along with a stool softener  Called colace    YOU NEED TO DRINK 3 GLASSES OF WATER DAILY TO REMAIN REGULAR   Your blood pressure does not need adjusting,  We are letting it run a little high to compensate for your slow heart rate

## 2015-08-12 NOTE — Progress Notes (Signed)
Pre-visit discussion using our clinic review tool. No additional management support is needed unless otherwise documented below in the visit note.  

## 2015-08-14 ENCOUNTER — Encounter: Payer: Self-pay | Admitting: *Deleted

## 2015-08-15 NOTE — Assessment & Plan Note (Signed)
He has had no behavioral issues recently .  He has developed urinary incontinence.   Recommended bathroom breaks every 2 hours.

## 2015-08-15 NOTE — Assessment & Plan Note (Signed)
Historically well-controlled on diet alone .  hemoglobin A1c has been consistently at or  less than 7.0 . Patient is up-to-date on eye exams and foot exam is normal today. Patient has  microalbuminuria. Patient is tolerating statin therapy for CAD risk reduction and on ACE/ARB for renal protection and hypertension .  Lab Results  Component Value Date   HGBA1C 6.2 07/28/2014   Lab Results  Component Value Date   MICROALBUR 63.4* 12/22/2014   Lab Results  Component Value Date   CHOL 165 11/06/2013   HDL 35.40* 11/06/2013   LDLCALC 119* 11/06/2013   LDLDIRECT 63.0 08/12/2015   TRIG 54.0 11/06/2013   CHOLHDL 5 11/06/2013

## 2015-08-15 NOTE — Assessment & Plan Note (Signed)
Recommended an increase in fiber and water intake  And daily use of Miralax. And colace.

## 2015-08-15 NOTE — Assessment & Plan Note (Addendum)
reviwed home readings.  Some lability noted,  No changes today given his age and episodic dizziness.

## 2015-09-28 ENCOUNTER — Other Ambulatory Visit: Payer: Self-pay | Admitting: Internal Medicine

## 2015-10-27 ENCOUNTER — Other Ambulatory Visit: Payer: Self-pay

## 2015-10-27 NOTE — Telephone Encounter (Signed)
Nurse who cares for Derrick Sullivan wants to know if dosage can be changed because the pill crumbles after being cut in half and the pharmacy wants clarification on current dosage. Please advise, thanks

## 2015-10-29 ENCOUNTER — Ambulatory Visit (INDEPENDENT_AMBULATORY_CARE_PROVIDER_SITE_OTHER): Payer: Medicare Other | Admitting: Internal Medicine

## 2015-10-29 ENCOUNTER — Encounter: Payer: Self-pay | Admitting: Internal Medicine

## 2015-10-29 VITALS — BP 138/54 | HR 63 | Temp 98.5°F | Resp 12 | Ht 69.0 in | Wt 163.2 lb

## 2015-10-29 DIAGNOSIS — R1013 Epigastric pain: Secondary | ICD-10-CM

## 2015-10-29 LAB — COMPREHENSIVE METABOLIC PANEL
ALBUMIN: 4.2 g/dL (ref 3.5–5.2)
ALT: 24 U/L (ref 0–53)
AST: 20 U/L (ref 0–37)
Alkaline Phosphatase: 66 U/L (ref 39–117)
BILIRUBIN TOTAL: 0.8 mg/dL (ref 0.2–1.2)
BUN: 27 mg/dL — ABNORMAL HIGH (ref 6–23)
CO2: 30 meq/L (ref 19–32)
CREATININE: 1.16 mg/dL (ref 0.40–1.50)
Calcium: 9.4 mg/dL (ref 8.4–10.5)
Chloride: 102 mEq/L (ref 96–112)
GFR: 62.95 mL/min (ref >60.00)
Glucose, Bld: 203 mg/dL — ABNORMAL HIGH (ref 70–99)
Potassium: 4.4 mEq/L (ref 3.5–5.1)
Sodium: 137 mEq/L (ref 135–145)
TOTAL PROTEIN: 6.4 g/dL (ref 6.0–8.3)

## 2015-10-29 LAB — LIPASE: Lipase: 24 U/L (ref 11.0–59.0)

## 2015-10-29 NOTE — Progress Notes (Signed)
Pre-visit discussion using our clinic review tool. No additional management support is needed unless otherwise documented below in the visit note.  

## 2015-10-29 NOTE — Progress Notes (Signed)
Subjective:  Patient ID: Derrick Sullivan, male    DOB: July 30, 1924  Age: 82 y.o. MRN: [id number]  CC: The encounter diagnosis was Abdominal pain, epigastric.  HPI Derrick Sullivan presents for new onset abdominal pain described as a burning in his stomach, not associated with eating .Woke up with a burning sensation around 6 am , went back to bed and was gone when he woke up at 9 am ..  Recurred at 10 am  Moving  bowels  On a regular basis  But feels he is constipated. Caregiver reports frequent episodes of rectal incontinence    Was recently started on depakote by Dr Melrose Nakayama for mood swings and belligerence without physicality   Outpatient Prescriptions Prior to Visit  Medication Sig Dispense Refill  . acetaminophen (TYLENOL) 500 MG tablet Take 500 mg by mouth at bedtime.    Marland Kitchen aspirin 81 MG tablet Take 81 mg by mouth daily.      Marland Kitchen atorvastatin (LIPITOR) 10 MG tablet TAKE ONE TABLET BY MOUTH EVERY DAY 90 tablet 4  . calcium carbonate (OS-CAL) 600 MG TABS Take 600 mg by mouth daily.    . ciclopirox (LOPROX) 0.77 % cream Apply topically as needed.      . citalopram (CELEXA) 20 MG tablet TAKE ONE TABLET BY MOUTH EVERY DAY 90 tablet 0  . diphenhydrAMINE (BENADRYL) 25 mg capsule Take 25 mg by mouth every 8 (eight) hours as needed.    . docusate sodium (COLACE) 250 MG capsule Take 1 capsule (250 mg total) by mouth daily. 30 capsule 5  . donepezil (ARICEPT) 5 MG tablet Take 1 tablet (5 mg total) by mouth at bedtime. 30 tablet 1  . enalapril (VASOTEC) 5 MG tablet Take 1 tablet (5 mg total) by mouth daily. 90 tablet 1  . fluocinonide (LIDEX) 0.05 % cream Apply 1 application topically as needed.      . furosemide (LASIX) 20 MG tablet Take 1 tablet (20 mg total) by mouth daily. 30 tablet 3  . hydrocortisone (WESTCORT) 0.2 % cream Apply 1 application topically as needed.      . lactulose (CHRONULAC) 10 GM/15ML solution TAKE 30MLS (20 G TOTAL) BY MOUTH EVERY 6HOURS AS NEEDED TO RELIEVE CONSTIPATION  240 mL 1  . memantine (NAMENDA) 5 MG tablet Take 5 mg by mouth 2 (two) times daily.    . Multiple Vitamins-Minerals (PRESERVISION AREDS PO) Take by mouth.      . nystatin (MYCOSTATIN) powder Apply topically 2 (two) times daily. 15 g 3  . polyethylene glycol powder (GLYCOLAX/MIRALAX) powder Take 17 g by mouth daily. 3350 g 1  . atenolol (TENORMIN) 25 MG tablet Take 0.5 tablets (12.5 mg total) by mouth 2 (two) times daily. 90 tablet 1   No facility-administered medications prior to visit.    Review of Systems;  Patient denies headache, fevers, malaise, unintentional weight loss, skin rash, eye pain, sinus congestion and sinus pain, sore throat, dysphagia,  hemoptysis , cough, dyspnea, wheezing, chest pain, palpitations, orthopnea, edema, abdominal pain, nausea, melena, diarrhea, constipation, flank pain, dysuria, hematuria, urinary  Frequency, nocturia, numbness, tingling, seizures,  Focal weakness, Loss of consciousness,  Tremor, insomnia, depression, anxiety, and suicidal ideation.      Objective:  BP 138/54 mmHg  Pulse 63  Temp(Src) 98.5 F (36.9 C) (Oral)  Resp 12  Ht _0  (1.753 m)  Wt 163 lb 4 oz (74.05 kg)  BMI 24.10 kg/m2  SpO2 97%  BP Readings from Last 3 Encounters:  10/29/15 138/54  08/12/15 138/60  05/13/15 158/60    Wt Readings from Last 3 Encounters:  10/29/15 163 lb 4 oz (74.05 kg)  08/12/15 156 lb 8 oz (70.988 kg)  05/13/15 162 lb (73.483 kg)    General appearance: alert, cooperative and appears stated age Ears: normal TM's and external ear canals both ears Throat: lips, mucosa, and tongue normal; teeth and gums normal Neck: no adenopathy, no carotid bruit, supple, symmetrical, trachea midline and thyroid not enlarged, symmetric, no tenderness/mass/nodules Back: symmetric, no curvature. ROM normal. No CVA tenderness. Lungs: clear to auscultation bilaterally Heart: regular rate and rhythm, S1, S2 normal, no murmur, click, rub or gallop Abdomen: soft,  non-tender; bowel sounds normal; no masses,  no organomegaly Pulses: 2+ and symmetric Skin: Skin color, texture, turgor normal. No rashes or lesions Lymph nodes: Cervical, supraclavicular, and axillary nodes normal.  Lab Results  Component Value Date   HGBA1C 6.2 07/28/2014   HGBA1C 6.4 11/06/2013   HGBA1C 6.4 03/14/2013    Lab Results  Component Value Date   CREATININE 1.16 10/29/2015   CREATININE 1.19 08/12/2015   CREATININE 1.01 12/29/2014    Lab Results  Component Value Date   WBC 7.8 08/12/2015   HGB 12.9* 08/12/2015   HCT 38.8* 08/12/2015   PLT 174.0 08/12/2015   GLUCOSE 203* 10/29/2015   CHOL 165 11/06/2013   TRIG 54.0 11/06/2013   HDL 35.40* 11/06/2013   LDLDIRECT 63.0 08/12/2015   LDLCALC 119* 11/06/2013   ALT 24 10/29/2015   AST 20 10/29/2015   NA 137 10/29/2015   K 4.4 10/29/2015   CL 102 10/29/2015   CREATININE 1.16 10/29/2015   BUN 27* 10/29/2015   CO2 30 10/29/2015   TSH 1.39 08/12/2015   HGBA1C 6.2 07/28/2014   MICROALBUR 63.4* 12/22/2014    No results found.  Assessment & Plan:   Problem List Items Addressed This Visit    Abdominal pain, epigastric - Primary    Lipase normal.  abd exam benign.  Will treat constipation       Relevant Orders   Lipase (Completed)   Comp Met (CMET) (Completed)    A total of 25 minutes of face to face time was spent with patient more than half of which was spent in counselling about the above mentioned conditions  and coordination of care   I am having Mr. Maiello maintain his aspirin, fluocinonide cream, ciclopirox, hydrocortisone valerate cream, Multiple Vitamins-Minerals (PRESERVISION AREDS PO), calcium carbonate, diphenhydrAMINE, acetaminophen, donepezil, nystatin, lactulose, furosemide, memantine, enalapril, atorvastatin, polyethylene glycol powder, docusate sodium, citalopram, and divalproex.  Meds ordered this encounter  Medications  . divalproex (DEPAKOTE) 125 MG DR tablet    Sig: Take 125 mg by mouth  daily.     There are no discontinued medications.  Follow-up: No Follow-up on file.   Crecencio Mc, MD

## 2015-10-29 NOTE — Patient Instructions (Addendum)
Caregivers need to get 32 ounces of water in him  during 8 hour shift  (16 and 16 )   He should be taking miralax and docusate daily   Use the lactulose every 3  Days if needed for no BM    We will start Gas X  Twice daily to see if it helps

## 2015-10-30 ENCOUNTER — Encounter: Payer: Self-pay | Admitting: *Deleted

## 2015-10-30 ENCOUNTER — Telehealth: Payer: Self-pay | Admitting: Internal Medicine

## 2015-10-30 MED ORDER — ATENOLOL 25 MG PO TABS: 12.5000 mg | ORAL_TABLET | Freq: Two times a day (BID) | ORAL | Status: DC

## 2015-10-30 MED ORDER — METOPROLOL SUCCINATE ER 25 MG PO TB24: 25.0000 mg | ORAL_TABLET | Freq: Every day | ORAL | Status: DC

## 2015-10-30 NOTE — Telephone Encounter (Signed)
Med changed to once daily toprol xl 25 mg

## 2015-10-30 NOTE — Telephone Encounter (Signed)
Dorothy Spark from [facility] called and left a vm on the triage phone regarding the third call for the prescription of atenolol (TENORMIN) 25 MG tablet to be faxed to [facility], [place]. She also stated if correct please fax. Pt is out of medication. Thank you!

## 2015-10-30 NOTE — Telephone Encounter (Signed)
Pt nurse came in wanting to have this prescription sent to [facility].. Please advise Peter Congo [phone]

## 2015-10-30 NOTE — Telephone Encounter (Signed)
Sent prescription as requested.

## 2015-10-31 DIAGNOSIS — G8929 Other chronic pain: Secondary | ICD-10-CM | POA: Insufficient documentation

## 2015-10-31 DIAGNOSIS — R1013 Epigastric pain: Secondary | ICD-10-CM | POA: Insufficient documentation

## 2015-10-31 NOTE — Assessment & Plan Note (Signed)
Lipase normal.  abd exam benign.  Will treat constipation

## 2015-11-03 ENCOUNTER — Telehealth: Payer: Self-pay

## 2015-11-03 MED ORDER — ATENOLOL 25 MG PO TABS: 12.5000 mg | ORAL_TABLET | Freq: Two times a day (BID) | ORAL | Status: DC

## 2015-11-03 NOTE — Telephone Encounter (Signed)
[facility] wants clarification on this pts Atenolol 25mg  Rx. Lavella Lemons previously took care of this Rx and it was sent e scribe but it needs to be faxed to [facility]

## 2015-12-02 ENCOUNTER — Encounter: Payer: Self-pay | Admitting: Internal Medicine

## 2015-12-02 ENCOUNTER — Ambulatory Visit (INDEPENDENT_AMBULATORY_CARE_PROVIDER_SITE_OTHER): Payer: Medicare Other | Admitting: Internal Medicine

## 2015-12-02 ENCOUNTER — Telehealth: Payer: Self-pay

## 2015-12-02 VITALS — BP 96/50 | HR 53 | Temp 97.9°F | Resp 12 | Ht 69.0 in | Wt 163.5 lb

## 2015-12-02 DIAGNOSIS — F05 Delirium due to known physiological condition: Secondary | ICD-10-CM | POA: Diagnosis not present

## 2015-12-02 DIAGNOSIS — I10 Essential (primary) hypertension: Secondary | ICD-10-CM | POA: Diagnosis not present

## 2015-12-02 DIAGNOSIS — N390 Urinary tract infection, site not specified: Secondary | ICD-10-CM | POA: Diagnosis not present

## 2015-12-02 LAB — POCT URINALYSIS DIPSTICK
Bilirubin, UA: NEGATIVE
Glucose, UA: NEGATIVE
KETONES UA: NEGATIVE
Nitrite, UA: NEGATIVE
PH UA: 6.5
RBC UA: NEGATIVE
SPEC GRAV UA: 1.025
UROBILINOGEN UA: NEGATIVE

## 2015-12-02 MED ORDER — CIPROFLOXACIN HCL 250 MG PO TABS: 250.0000 mg | ORAL_TABLET | Freq: Two times a day (BID) | ORAL | Status: DC

## 2015-12-02 MED ORDER — CULTURELLE DIGESTIVE HEALTH PO CAPS: 1.0000 | ORAL_CAPSULE | Freq: Every day | ORAL | Status: DC

## 2015-12-02 NOTE — Telephone Encounter (Signed)
Nurse had question as the Aricept being 5 mg or 10 mg nightly but Dr. Melrose Nakayama prescribes medication for patient advised to call Dr. Weber Cooks office.

## 2015-12-02 NOTE — Telephone Encounter (Signed)
Derrick Sullivan requested to speak with Derrick Sullivan about Derrick Sullivan. [name]'s number is [phone]. Please advise, thanks

## 2015-12-02 NOTE — Patient Instructions (Addendum)
I am treating Derrick Sullivan for a urinary tract infection with Cipro for For 2 weeks   He needs to have a daily probiotic for 4 weeks to prevent Clostridium dificile colitis  Also:  His BP is LOW TODAY .  He is not drinking enough fluids.   Do not give atenolol or enalapril until his morning  BP is above 150

## 2015-12-02 NOTE — Progress Notes (Signed)
Pre-visit discussion using our clinic review tool. No additional management support is needed unless otherwise documented below in the visit note.  

## 2015-12-02 NOTE — Progress Notes (Signed)
Subjective:  Patient ID: Derrick Sullivan, male    DOB: 04-Mar-1924  Age: 82 y.o. MRN: [id number]  CC: The primary encounter diagnosis was Acute confusional state. Diagnoses of Acute UTI and Essential hypertension were also pertinent to this visit.  HPI Derrick Sullivan presents for INCREASED CONFUSION, noticed by one of patient's caregivers.   but not the one accompanying him today.  UNCLEAR ONSET SINCE CURRENT CAREGIVER WAS NOT WITH HIM Derrick Sullivan .  CC reported.  patietn denies pain ,  Dysruia, polyuria and nuaesa and is in good spirits and very interactive.   Outpatient Prescriptions Prior to Visit  Medication Sig Dispense Refill  . acetaminophen (TYLENOL) 500 MG tablet Take 500 mg by mouth at bedtime.    Marland Kitchen aspirin 81 MG tablet Take 81 mg by mouth daily.      Marland Kitchen atenolol (TENORMIN) 25 MG tablet Take 0.5 tablets (12.5 mg total) by mouth 2 (two) times daily. 90 tablet 1  . atorvastatin (LIPITOR) 10 MG tablet TAKE ONE TABLET BY MOUTH EVERY DAY 90 tablet 4  . calcium carbonate (OS-CAL) 600 MG TABS Take 600 mg by mouth daily.    . citalopram (CELEXA) 20 MG tablet TAKE ONE TABLET BY MOUTH EVERY DAY 90 tablet 0  . divalproex (DEPAKOTE) 125 MG DR tablet Take 125 mg by mouth daily.     Marland Kitchen donepezil (ARICEPT) 5 MG tablet Take 1 tablet (5 mg total) by mouth at bedtime. 30 tablet 1  . enalapril (VASOTEC) 5 MG tablet Take 1 tablet (5 mg total) by mouth daily. 90 tablet 1  . memantine (NAMENDA) 5 MG tablet Take 5 mg by mouth 2 (two) times daily.    . Multiple Vitamins-Minerals (PRESERVISION AREDS PO) Take by mouth.      . ciclopirox (LOPROX) 0.77 % cream Apply topically as needed. Reported on 12/02/2015    . diphenhydrAMINE (BENADRYL) 25 mg capsule Take 25 mg by mouth every 8 (eight) hours as needed. Reported on 12/02/2015    . docusate sodium (COLACE) 250 MG capsule Take 1 capsule (250 mg total) by mouth daily. (Patient not taking: Reported on 12/02/2015) 30 capsule 5  . fluocinonide (LIDEX) 0.05 %  cream Apply 1 application topically as needed. Reported on 12/02/2015    . furosemide (LASIX) 20 MG tablet Take 1 tablet (20 mg total) by mouth daily. (Patient not taking: Reported on 12/02/2015) 30 tablet 3  . hydrocortisone (WESTCORT) 0.2 % cream Apply 1 application topically as needed. Reported on 12/02/2015    . lactulose (CHRONULAC) 10 GM/15ML solution TAKE 30MLS (20 G TOTAL) BY MOUTH EVERY 6HOURS AS NEEDED TO RELIEVE CONSTIPATION (Patient not taking: Reported on 12/02/2015) 240 mL 1  . metoprolol succinate (TOPROL-XL) 25 MG 24 hr tablet Take 1 tablet (25 mg total) by mouth daily. (Patient not taking: Reported on 12/02/2015) 90 tablet 3  . nystatin (MYCOSTATIN) powder Apply topically 2 (two) times daily. (Patient not taking: Reported on 12/02/2015) 15 g 3  . polyethylene glycol powder (GLYCOLAX/MIRALAX) powder Take 17 g by mouth daily. (Patient not taking: Reported on 12/02/2015) 3350 g 1   No facility-administered medications prior to visit.    Review of Systems;  Patient denies headache, fevers, malaise, unintentional weight loss, skin rash, eye pain, sinus congestion and sinus pain, sore throat, dysphagia,  hemoptysis , cough, dyspnea, wheezing, chest pain, palpitations, orthopnea, edema, abdominal pain, nausea, melena, diarrhea, constipation, flank pain, dysuria, hematuria, urinary  Frequency, nocturia, numbness, tingling, seizures,  Focal weakness, Loss of  consciousness,  Tremor, insomnia, depression, anxiety, and suicidal ideation.      Objective:  BP 96/50 mmHg  Pulse 53  Temp(Src) 97.9 F (36.6 C) (Oral)  Resp 12  Ht 5\' 9"  (1.753 m)  Wt 163 lb 8 oz (74.163 kg)  BMI 24.13 kg/m2  SpO2 96%  BP Readings from Last 3 Encounters:  12/02/15 96/50  10/29/15 138/54  08/12/15 138/60    Wt Readings from Last 3 Encounters:  12/02/15 163 lb 8 oz (74.163 kg)  10/29/15 163 lb 4 oz (74.05 kg)  08/12/15 156 lb 8 oz (70.988 kg)    General appearance: alert, cooperative and appears stated  age Ears: normal TM's and external ear canals both ears Throat: lips, mucosa, and tongue normal; teeth and gums normal Neck: no adenopathy, no carotid bruit, supple, symmetrical, trachea midline and thyroid not enlarged, symmetric, no tenderness/mass/nodules Back: symmetric, no curvature. ROM normal. No CVA tenderness. Lungs: clear to auscultation bilaterally Heart: regular rate and rhythm, S1, S2 normal, no murmur, click, rub or gallop Abdomen: soft, non-tender; bowel sounds normal; no masses,  no organomegaly Pulses: 2+ and symmetric Skin: Skin color, texture, turgor normal. No rashes or lesions Lymph nodes: Cervical, supraclavicular, and axillary nodes normal. Psych: affect jovial makes good eye contact. No fidgeting,  Smiles easily.  No aggressive behavior.  Lab Results  Component Value Date   HGBA1C 6.2 07/28/2014   HGBA1C 6.4 11/06/2013   HGBA1C 6.4 03/14/2013    Lab Results  Component Value Date   CREATININE 1.16 10/29/2015   CREATININE 1.19 08/12/2015   CREATININE 1.01 12/29/2014    Lab Results  Component Value Date   WBC 7.8 08/12/2015   HGB 12.9* 08/12/2015   HCT 38.8* 08/12/2015   PLT 174.0 08/12/2015   GLUCOSE 203* 10/29/2015   CHOL 165 11/06/2013   TRIG 54.0 11/06/2013   HDL 35.40* 11/06/2013   LDLDIRECT 63.0 08/12/2015   LDLCALC 119* 11/06/2013   ALT 24 10/29/2015   AST 20 10/29/2015   NA 137 10/29/2015   K 4.4 10/29/2015   CL 102 10/29/2015   CREATININE 1.16 10/29/2015   BUN 27* 10/29/2015   CO2 30 10/29/2015   TSH 1.39 08/12/2015   HGBA1C 6.2 07/28/2014   MICROALBUR 63.4* 12/22/2014     Assessment & Plan:   Problem List Items Addressed This Visit    Hypertension    With recent drop noted.  Suspending  Enalapril and atenolol until SBP  > 130/80      Acute UTI    Suggested by dipstick UA and altered mentation by report .  Cipro x 2 weeks.         Other Visit Diagnoses    Acute confusional state    -  Primary    Relevant Orders    POCT  Urinalysis Dipstick (Completed)    Urine Culture (Completed)       I am having Mr. Neupert start on ciprofloxacin and [place]. I am also having him maintain his aspirin, fluocinonide cream, ciclopirox, hydrocortisone valerate cream, Multiple Vitamins-Minerals (PRESERVISION AREDS PO), calcium carbonate, diphenhydrAMINE, acetaminophen, donepezil, nystatin, lactulose, furosemide, memantine, enalapril, atorvastatin, polyethylene glycol powder, docusate sodium, citalopram, divalproex, metoprolol succinate, atenolol, and magnesium citrate.  Meds ordered this encounter  Medications  . magnesium citrate solution    Sig: Take 5 mLs by mouth once.  . ciprofloxacin (CIPRO) 250 MG tablet    Sig: Take 1 tablet (250 mg total) by mouth 2 (two) times daily. For 2 weeks  Dispense:  28 tablet    Refill:  0  . Lactobacillus-Inulin ([place]) CAPS    Sig: Take 1 capsule by mouth daily.    Dispense:  28 capsule    Refill:  0    PHARMACY MAY SUBSTITUTE GENERIC PROBIOTIC    There are no discontinued medications.  Follow-up: No Follow-up on file.   Crecencio Mc, MD

## 2015-12-04 LAB — URINE CULTURE
COLONY COUNT: NO GROWTH
ORGANISM ID, BACTERIA: NO GROWTH

## 2015-12-04 NOTE — Assessment & Plan Note (Addendum)
Suggested by dipstick UA and altered mentation by report .  Cipro x 2 weeks.

## 2015-12-04 NOTE — Assessment & Plan Note (Signed)
With recent drop noted.  Suspending  Enalapril and atenolol until SBP  > 130/80

## 2015-12-07 ENCOUNTER — Telehealth: Payer: Self-pay

## 2015-12-07 NOTE — Telephone Encounter (Signed)
Follow up call requesting results from Urine Culture last week, I see results are in, can I give them to the patient? Thanks

## 2015-12-07 NOTE — Telephone Encounter (Signed)
There is no evidence of UTI by culture results.   

## 2015-12-08 NOTE — Telephone Encounter (Signed)
Patient notified

## 2015-12-11 ENCOUNTER — Ambulatory Visit: Payer: Medicare Other | Admitting: Internal Medicine

## 2016-01-13 DIAGNOSIS — G301 Alzheimer's disease with late onset: Secondary | ICD-10-CM

## 2016-01-13 DIAGNOSIS — F0280 Dementia in other diseases classified elsewhere without behavioral disturbance: Secondary | ICD-10-CM | POA: Insufficient documentation

## 2016-02-01 ENCOUNTER — Emergency Department: Payer: Medicare Other

## 2016-02-01 ENCOUNTER — Inpatient Hospital Stay
Admission: EM | Admit: 2016-02-01 | Discharge: 2016-02-03 | DRG: 690 | Disposition: A | Discharge status: Home Health | Payer: Medicare Other | Attending: Internal Medicine | Admitting: Internal Medicine

## 2016-02-01 ENCOUNTER — Encounter: Payer: Self-pay | Admitting: Emergency Medicine

## 2016-02-01 DIAGNOSIS — N390 Urinary tract infection, site not specified: Secondary | ICD-10-CM | POA: Diagnosis present

## 2016-02-01 DIAGNOSIS — B9620 Unspecified Escherichia coli [E. coli] as the cause of diseases classified elsewhere: Secondary | ICD-10-CM | POA: Diagnosis present

## 2016-02-01 DIAGNOSIS — Z66 Do not resuscitate: Secondary | ICD-10-CM | POA: Diagnosis present

## 2016-02-01 DIAGNOSIS — I248 Other forms of acute ischemic heart disease: Secondary | ICD-10-CM | POA: Diagnosis present

## 2016-02-01 DIAGNOSIS — I2510 Atherosclerotic heart disease of native coronary artery without angina pectoris: Secondary | ICD-10-CM | POA: Diagnosis present

## 2016-02-01 DIAGNOSIS — Z792 Long term (current) use of antibiotics: Secondary | ICD-10-CM

## 2016-02-01 DIAGNOSIS — Z79899 Other long term (current) drug therapy: Secondary | ICD-10-CM

## 2016-02-01 DIAGNOSIS — Z8249 Family history of ischemic heart disease and other diseases of the circulatory system: Secondary | ICD-10-CM | POA: Diagnosis not present

## 2016-02-01 DIAGNOSIS — Z7982 Long term (current) use of aspirin: Secondary | ICD-10-CM | POA: Diagnosis not present

## 2016-02-01 DIAGNOSIS — M810 Age-related osteoporosis without current pathological fracture: Secondary | ICD-10-CM | POA: Diagnosis present

## 2016-02-01 DIAGNOSIS — Z87891 Personal history of nicotine dependence: Secondary | ICD-10-CM

## 2016-02-01 DIAGNOSIS — Z888 Allergy status to other drugs, medicaments and biological substances status: Secondary | ICD-10-CM | POA: Diagnosis not present

## 2016-02-01 DIAGNOSIS — I739 Peripheral vascular disease, unspecified: Secondary | ICD-10-CM | POA: Diagnosis present

## 2016-02-01 DIAGNOSIS — Z8711 Personal history of peptic ulcer disease: Secondary | ICD-10-CM | POA: Diagnosis not present

## 2016-02-01 DIAGNOSIS — R7989 Other specified abnormal findings of blood chemistry: Secondary | ICD-10-CM

## 2016-02-01 DIAGNOSIS — N400 Enlarged prostate without lower urinary tract symptoms: Secondary | ICD-10-CM | POA: Diagnosis present

## 2016-02-01 DIAGNOSIS — E785 Hyperlipidemia, unspecified: Secondary | ICD-10-CM | POA: Diagnosis present

## 2016-02-01 DIAGNOSIS — Z86011 Personal history of benign neoplasm of the brain: Secondary | ICD-10-CM

## 2016-02-01 DIAGNOSIS — I10 Essential (primary) hypertension: Secondary | ICD-10-CM | POA: Diagnosis present

## 2016-02-01 DIAGNOSIS — R778 Other specified abnormalities of plasma proteins: Secondary | ICD-10-CM

## 2016-02-01 DIAGNOSIS — F0390 Unspecified dementia without behavioral disturbance: Secondary | ICD-10-CM | POA: Diagnosis present

## 2016-02-01 DIAGNOSIS — Z833 Family history of diabetes mellitus: Secondary | ICD-10-CM | POA: Diagnosis not present

## 2016-02-01 HISTORY — DX: Unspecified dementia, unspecified severity, without behavioral disturbance, psychotic disturbance, mood disturbance, and anxiety: F03.90

## 2016-02-01 LAB — COMPREHENSIVE METABOLIC PANEL
ALBUMIN: 3.7 g/dL (ref 3.5–5.0)
ALK PHOS: 60 U/L (ref 38–126)
ALT: 22 U/L (ref 17–63)
ANION GAP: 9 (ref 5–15)
AST: 23 U/L (ref 15–41)
BILIRUBIN TOTAL: 1.5 mg/dL — AB (ref 0.3–1.2)
BUN: 33 mg/dL — ABNORMAL HIGH (ref 6–20)
CALCIUM: 8.9 mg/dL (ref 8.9–10.3)
CO2: 24 mmol/L (ref 22–32)
CREATININE: 1.18 mg/dL (ref 0.61–1.24)
Chloride: 101 mmol/L (ref 101–111)
GFR calc Af Amer: >60 mL/min (ref >60)
GFR calc non Af Amer: 53 mL/min — ABNORMAL LOW (ref >60)
GLUCOSE: 178 mg/dL — AB (ref 65–99)
Potassium: 4.4 mmol/L (ref 3.5–5.1)
SODIUM: 134 mmol/L — AB (ref 135–145)
TOTAL PROTEIN: 6.5 g/dL (ref 6.5–8.1)

## 2016-02-01 LAB — URINALYSIS COMPLETE WITH MICROSCOPIC (ARMC ONLY)
BILIRUBIN URINE: NEGATIVE
GLUCOSE, UA: NEGATIVE mg/dL
KETONES UR: NEGATIVE mg/dL
NITRITE: NEGATIVE
PROTEIN: 100 mg/dL — AB
SPECIFIC GRAVITY, URINE: 1.017 (ref 1.005–1.030)
pH: 6 (ref 5.0–8.0)

## 2016-02-01 LAB — GASTROINTESTINAL PANEL BY PCR, STOOL (REPLACES STOOL CULTURE)
ADENOVIRUS F40/41: NOT DETECTED
ASTROVIRUS: NOT DETECTED
CAMPYLOBACTER SPECIES: NOT DETECTED
CYCLOSPORA CAYETANENSIS: NOT DETECTED
Cryptosporidium: NOT DETECTED
E. COLI O157: NOT DETECTED
ENTEROPATHOGENIC E COLI (EPEC): NOT DETECTED
ENTEROTOXIGENIC E COLI (ETEC): NOT DETECTED
Entamoeba histolytica: NOT DETECTED
Enteroaggregative E coli (EAEC): NOT DETECTED
Giardia lamblia: NOT DETECTED
NOROVIRUS GI/GII: NOT DETECTED
Plesimonas shigelloides: NOT DETECTED
ROTAVIRUS A: NOT DETECTED
SAPOVIRUS (I, II, IV, AND V): NOT DETECTED
SHIGA LIKE TOXIN PRODUCING E COLI (STEC): NOT DETECTED
Salmonella species: NOT DETECTED
Shigella/Enteroinvasive E coli (EIEC): NOT DETECTED
VIBRIO SPECIES: NOT DETECTED
Vibrio cholerae: NOT DETECTED
Yersinia enterocolitica: NOT DETECTED

## 2016-02-01 LAB — CBC
HCT: 38.1 % — ABNORMAL LOW (ref 40.0–52.0)
HEMOGLOBIN: 13.1 g/dL (ref 13.0–18.0)
MCH: 32.2 pg (ref 26.0–34.0)
MCHC: 34.5 g/dL (ref 32.0–36.0)
MCV: 93.1 fL (ref 80.0–100.0)
PLATELETS: 145 10*3/uL — AB (ref 150–440)
RBC: 4.09 MIL/uL — ABNORMAL LOW (ref 4.40–5.90)
RDW: 13.3 % (ref 11.5–14.5)
WBC: 18 10*3/uL — ABNORMAL HIGH (ref 3.8–10.6)

## 2016-02-01 LAB — C DIFFICILE QUICK SCREEN W PCR REFLEX
C DIFFICILE (CDIFF) INTERP: NEGATIVE
C DIFFICILE (CDIFF) TOXIN: NEGATIVE
C DIFFICLE (CDIFF) ANTIGEN: NEGATIVE

## 2016-02-01 LAB — TROPONIN I
Troponin I: 0.07 ng/mL — ABNORMAL HIGH (ref <0.031)
Troponin I: 0.08 ng/mL — ABNORMAL HIGH (ref <0.031)

## 2016-02-01 LAB — LIPASE, BLOOD: Lipase: 24 U/L (ref 11–51)

## 2016-02-01 LAB — VALPROIC ACID LEVEL: Valproic Acid Lvl: 10 ug/mL — ABNORMAL LOW (ref 50.0–100.0)

## 2016-02-01 MED ORDER — SODIUM CHLORIDE 0.9 % IV SOLN
INTRAVENOUS | Status: DC
  Administered 2016-02-01 – 2016-02-03 (×3): via INTRAVENOUS

## 2016-02-01 MED ORDER — CALCIUM CARBONATE ANTACID 500 MG PO CHEW
600.0000 mg | CHEWABLE_TABLET | Freq: Two times a day (BID) | ORAL | Status: DC
  Administered 2016-02-02 – 2016-02-03 (×3): 600 mg via ORAL
  Filled 2016-02-01 (×3): qty 3

## 2016-02-01 MED ORDER — HYDRALAZINE HCL 20 MG/ML IJ SOLN: 10.0000 mg | Freq: Four times a day (QID) | INTRAMUSCULAR | Status: DC | PRN

## 2016-02-01 MED ORDER — CITALOPRAM HYDROBROMIDE 20 MG PO TABS
20.0000 mg | ORAL_TABLET | Freq: Every day | ORAL | Status: DC
  Administered 2016-02-01 – 2016-02-03 (×3): 20 mg via ORAL
  Filled 2016-02-01 (×3): qty 1

## 2016-02-01 MED ORDER — ACETAMINOPHEN 500 MG PO TABS: 1000.0000 mg | ORAL_TABLET | Freq: Four times a day (QID) | ORAL | Status: DC | PRN

## 2016-02-01 MED ORDER — ACETAMINOPHEN 650 MG RE SUPP: 650.0000 mg | Freq: Four times a day (QID) | RECTAL | Status: DC | PRN

## 2016-02-01 MED ORDER — ONDANSETRON HCL 4 MG/2ML IJ SOLN: 4.0000 mg | Freq: Four times a day (QID) | INTRAMUSCULAR | Status: DC | PRN

## 2016-02-01 MED ORDER — DIATRIZOATE MEGLUMINE & SODIUM 66-10 % PO SOLN
15.0000 mL | Freq: Once | ORAL | Status: AC
  Administered 2016-02-01: 15 mL via ORAL

## 2016-02-01 MED ORDER — CALCIUM CARBONATE 1500 (600 CA) MG PO TABS: 600.0000 mg | ORAL_TABLET | Freq: Two times a day (BID) | ORAL | Status: DC

## 2016-02-01 MED ORDER — DONEPEZIL HCL 5 MG PO TABS
10.0000 mg | ORAL_TABLET | Freq: Every day | ORAL | Status: DC
  Administered 2016-02-01 – 2016-02-02 (×2): 10 mg via ORAL
  Filled 2016-02-01 (×2): qty 2

## 2016-02-01 MED ORDER — ACETAMINOPHEN 325 MG PO TABS
650.0000 mg | ORAL_TABLET | Freq: Four times a day (QID) | ORAL | Status: DC | PRN
  Administered 2016-02-02: 650 mg via ORAL
  Filled 2016-02-01: qty 2

## 2016-02-01 MED ORDER — SODIUM CHLORIDE 0.9 % IV BOLUS (SEPSIS)
500.0000 mL | Freq: Once | INTRAVENOUS | Status: AC
  Administered 2016-02-01: 500 mL via INTRAVENOUS

## 2016-02-01 MED ORDER — ATORVASTATIN CALCIUM 10 MG PO TABS
10.0000 mg | ORAL_TABLET | Freq: Every day | ORAL | Status: DC
  Administered 2016-02-01 – 2016-02-02 (×2): 10 mg via ORAL
  Filled 2016-02-01 (×2): qty 1

## 2016-02-01 MED ORDER — ENOXAPARIN SODIUM 40 MG/0.4ML ~~LOC~~ SOLN
40.0000 mg | SUBCUTANEOUS | Status: DC
  Administered 2016-02-01 – 2016-02-02 (×2): 40 mg via SUBCUTANEOUS
  Filled 2016-02-01 (×2): qty 0.4

## 2016-02-01 MED ORDER — SENNOSIDES-DOCUSATE SODIUM 8.6-50 MG PO TABS: 1.0000 | ORAL_TABLET | Freq: Every evening | ORAL | Status: DC | PRN

## 2016-02-01 MED ORDER — ONDANSETRON HCL 4 MG PO TABS: 4.0000 mg | ORAL_TABLET | Freq: Four times a day (QID) | ORAL | Status: DC | PRN

## 2016-02-01 MED ORDER — DIPHENHYDRAMINE HCL 25 MG PO CAPS: 25.0000 mg | ORAL_CAPSULE | Freq: Three times a day (TID) | ORAL | Status: DC | PRN

## 2016-02-01 MED ORDER — DOCUSATE SODIUM 100 MG PO CAPS
100.0000 mg | ORAL_CAPSULE | Freq: Two times a day (BID) | ORAL | Status: DC
  Administered 2016-02-02 – 2016-02-03 (×3): 100 mg via ORAL
  Filled 2016-02-01 (×3): qty 1

## 2016-02-01 MED ORDER — DEXTROSE 5 % IV SOLN
1.0000 g | INTRAVENOUS | Status: DC
  Administered 2016-02-01 – 2016-02-02 (×2): 1 g via INTRAVENOUS
  Filled 2016-02-01 (×3): qty 10

## 2016-02-01 MED ORDER — ASPIRIN EC 81 MG PO TBEC
81.0000 mg | DELAYED_RELEASE_TABLET | Freq: Every day | ORAL | Status: DC
  Administered 2016-02-01 – 2016-02-03 (×3): 81 mg via ORAL
  Filled 2016-02-01 (×3): qty 1

## 2016-02-01 MED ORDER — IOPAMIDOL (ISOVUE-300) INJECTION 61%
100.0000 mL | Freq: Once | INTRAVENOUS | Status: AC | PRN
  Administered 2016-02-01: 100 mL via INTRAVENOUS

## 2016-02-01 MED ORDER — POLYETHYLENE GLYCOL 3350 17 G PO PACK: 17.0000 g | PACK | Freq: Every day | ORAL | Status: DC | PRN

## 2016-02-01 MED ORDER — MEMANTINE HCL 5 MG PO TABS
5.0000 mg | ORAL_TABLET | Freq: Two times a day (BID) | ORAL | Status: DC
  Administered 2016-02-01 – 2016-02-03 (×4): 5 mg via ORAL
  Filled 2016-02-01 (×4): qty 1

## 2016-02-01 MED ORDER — RISAQUAD PO CAPS
1.0000 | ORAL_CAPSULE | Freq: Every day | ORAL | Status: DC
  Administered 2016-02-01 – 2016-02-02 (×2): 1 via ORAL
  Filled 2016-02-01 (×2): qty 1

## 2016-02-01 MED ORDER — ASPIRIN 81 MG PO CHEW
324.0000 mg | CHEWABLE_TABLET | Freq: Once | ORAL | Status: AC
  Administered 2016-02-01: 324 mg via ORAL
  Filled 2016-02-01: qty 4

## 2016-02-01 MED ORDER — FUROSEMIDE 20 MG PO TABS: 20.0000 mg | ORAL_TABLET | Freq: Every day | ORAL | Status: DC | PRN

## 2016-02-01 MED ORDER — PIPERACILLIN-TAZOBACTAM 3.375 G IVPB 30 MIN
3.3750 g | Freq: Once | INTRAVENOUS | Status: AC
  Administered 2016-02-01: 3.375 g via INTRAVENOUS
  Filled 2016-02-01: qty 50

## 2016-02-01 MED ORDER — OCUVITE-LUTEIN PO CAPS
1.0000 | ORAL_CAPSULE | Freq: Every day | ORAL | Status: DC
  Administered 2016-02-01 – 2016-02-03 (×3): 1 via ORAL
  Filled 2016-02-01 (×3): qty 1

## 2016-02-01 MED ORDER — ATENOLOL 12.5 MG HALF TABLET
12.5000 mg | ORAL_TABLET | Freq: Two times a day (BID) | ORAL | Status: DC
  Administered 2016-02-01 – 2016-02-03 (×4): 12.5 mg via ORAL
  Filled 2016-02-01 (×5): qty 1

## 2016-02-01 MED ORDER — DIVALPROEX SODIUM 125 MG PO DR TAB
125.0000 mg | DELAYED_RELEASE_TABLET | Freq: Two times a day (BID) | ORAL | Status: DC
  Administered 2016-02-01 – 2016-02-03 (×4): 125 mg via ORAL
  Filled 2016-02-01 (×6): qty 1

## 2016-02-01 NOTE — Progress Notes (Signed)
Pharmacy Antibiotic Note  Derrick Sullivan is a 82 y.o. male admitted on 02/01/2016 with UTI.  Pharmacy has been consulted for ceftriaxone dosing.  Plan: Ceftriaxone 1 g IV daily  Height: 5\' 10"  (177.8 cm) Weight: 165 lb (74.844 kg) IBW/kg (Calculated) : 73  Temp (24hrs), Avg:98.2 F (36.8 C), Min:98.2 F (36.8 C), Max:98.2 F (36.8 C)   Recent Labs Lab 02/01/16 1242  WBC 18.0*  CREATININE 1.18    Estimated Creatinine Clearance: 43.8 mL/min (by C-G formula based on Cr of 1.18).    Allergies  Allergen Reactions  . Mirtazapine Rash   Antimicrobials this admission: ceftriaxone 5/29 >>   Dose adjustments this admission:  Microbiology results: 5/29 BCx: Sent  Thank you for allowing pharmacy to be a part of this patient's care.  Lenis Noon, PharmD Clinical Pharmacist 02/01/2016 6:07 PM

## 2016-02-01 NOTE — ED Provider Notes (Signed)
[facility] Emergency Department Provider Note   ____________________________________________  Time seen: Approximately 2:59 PM  I have reviewed the triage vital signs and the nursing notes.   HISTORY  Chief Complaint Urinary Frequency; Diarrhea; and Emesis    HPI Derrick Sullivan is a 82 y.o. male with history of coronary artery disease, hyperlipidemia, hypertension, dementia who presents for evaluation of lower abdominal pain today as well as nausea, vomiting and diarrhea, gradual onset, constant, mild to moderate, no modifying factors. No chest pain or difficulty breathing. No fevers or chills. He has had increased urinary frequency.   Past Medical History  Diagnosis Date  . CAD (coronary artery disease)   . Peripheral vascular disease ([place])   . Degenerative disk disease   . Presbyacusis   . Osteoporosis   . Duodenal ulcer 07/2004    NSAID induced  . Hyperlipidemia   . Benign prostatic hypertrophy   . Benign meningioma ([place]) 2006    s/p parietal resection  . Osteopenia   . Carotid artery stenosis, asymptomatic 2008    hemodynamically insignificant  . Diverticulosis of colon 2007  . Hypertension 09/26/2011  . Dementia     Patient Active Problem List   Diagnosis Date Noted  . UTI (lower urinary tract infection) 02/01/2016  . Acute UTI 12/02/2015  . Abdominal pain, epigastric 10/31/2015  . Do not resuscitate discussion 05/13/2015  . Bilateral lower extremity edema 12/23/2014  . Autonomic dysfunction 10/15/2014  . Unsteady gait 05/13/2013  . Functional constipation 10/02/2012  . Mitral insufficiency 05/11/2012  . Other and unspecified hyperlipidemia 03/27/2012  . Neuropathy of foot 11/27/2011  . Duodenal ulcer due to nonsteroidal anti-inflammatory drug (NSAID) 09/26/2011  . Hypertension 09/26/2011  . Diabetes mellitus with peripheral artery disease ([place]) 09/26/2011  . Alzheimer's dementia with behavioral disturbance 08/23/2011  .  Benign prostatic hypertrophy   . Benign meningioma ([place])   . Osteopenia   . Carotid stenosis, left   . Diverticulosis of colon     Past Surgical History  Procedure Laterality Date  . Shoulder surgery  2000    rotator cuff  . Gallbladder surgery  2000  . Brain surgery      meningioma resection    Current Outpatient Rx  Name  Route  Sig  Dispense  Refill  . acetaminophen (TYLENOL) 500 MG tablet   Oral   Take 1,000 mg by mouth every 6 (six) hours as needed for mild pain, fever or headache.          Marland Kitchen acidophilus (RISAQUAD) CAPS capsule   Oral   Take 1 capsule by mouth at bedtime.         Marland Kitchen aspirin EC 81 MG tablet   Oral   Take 81 mg by mouth daily.         Marland Kitchen atenolol (TENORMIN) 25 MG tablet   Oral   Take 0.5 tablets (12.5 mg total) by mouth 2 (two) times daily.   90 tablet   1   . atorvastatin (LIPITOR) 10 MG tablet   Oral   Take 10 mg by mouth at bedtime.         . calcium carbonate (OSCAL) 1500 (600 Ca) MG TABS tablet   Oral   Take 600 mg of elemental calcium by mouth 2 (two) times daily with a meal.         . citalopram (CELEXA) 20 MG tablet   Oral   Take 20 mg by mouth daily.         Marland Kitchen  diphenhydrAMINE (BENADRYL) 25 mg capsule   Oral   Take 25 mg by mouth every 8 (eight) hours as needed for itching or allergies.          Marland Kitchen divalproex (DEPAKOTE) 125 MG DR tablet   Oral   Take 125 mg by mouth 2 (two) times daily.          Marland Kitchen docusate sodium (COLACE) 100 MG capsule   Oral   Take 100 mg by mouth 2 (two) times daily.         Marland Kitchen donepezil (ARICEPT) 10 MG tablet   Oral   Take 10 mg by mouth at bedtime.         . fluocinonide (LIDEX) 0.05 % cream   Topical   Apply 1 application topically as needed (for irritation).          . furosemide (LASIX) 20 MG tablet   Oral   Take 20 mg by mouth daily as needed for edema.         . memantine (NAMENDA) 5 MG tablet   Oral   Take 5 mg by mouth 2 (two) times daily.         . Multiple  Vitamins-Minerals (PRESERVISION AREDS 2) CAPS   Oral   Take 1 capsule by mouth daily.         . polyethylene glycol (MIRALAX / GLYCOLAX) packet   Oral   Take 17 g by mouth daily as needed for mild constipation.         . ciprofloxacin (CIPRO) 250 MG tablet   Oral   Take 1 tablet (250 mg total) by mouth 2 (two) times daily. For 2 weeks Patient not taking: Reported on 02/01/2016   28 tablet   0     Allergies Mirtazapine  Family History  Problem Relation Age of Onset  . Diabetes Mother   . Heart disease Father     Social History Social History  Substance Use Topics  . Smoking status: Never Smoker   . Smokeless tobacco: Former Systems developer    Quit date: 07/05/1956  . Alcohol Use: No    Review of Systems Constitutional: No fever/chills Eyes: No visual changes. ENT: No sore throat. Cardiovascular: Denies chest pain. Respiratory: Denies shortness of breath. Gastrointestinal: + abdominal pain.  No nausea, + vomiting.  + diarrhea.  No constipation. Genitourinary: Negative for dysuria. Musculoskeletal: Negative for back pain. Skin: Negative for rash. Neurological: Negative for headaches, focal weakness or numbness.  10-point ROS otherwise negative.  ____________________________________________   PHYSICAL EXAM:  VITAL SIGNS: ED Triage Vitals  Enc Vitals Group     BP 02/01/16 1233 134/48 mmHg     Pulse Rate 02/01/16 1233 63     Resp 02/01/16 1233 18     Temp 02/01/16 1233 98.2 F (36.8 C)     Temp Source 02/01/16 1233 Oral     SpO2 02/01/16 1233 98 %     Weight 02/01/16 1233 165 lb (74.844 kg)     Height 02/01/16 1233 5\' 10"  (1.778 m)     Head Cir --      Peak Flow --      Pain Score 02/01/16 1233 0     Pain Loc --      Pain Edu? --      Excl. in [place]? --     Constitutional: Alert and oriented. Well appearing and in no acute distress. Eyes: Conjunctivae are normal. PERRL. EOMI. Head: Atraumatic. Nose: No congestion/rhinnorhea. Mouth/Throat: Mucous membranes  are moist.  Oropharynx non-erythematous. Neck: No stridor.  Supple without meningismus. Cardiovascular: Normal rate, regular rhythm. Grossly normal heart sounds.  Good peripheral circulation. Respiratory: Normal respiratory effort.  No retractions. Lungs CTAB. Gastrointestinal: Soft mild superpubic tenderness to palpation. No CVA tenderness. Genitourinary: Deferred Musculoskeletal: No lower extremity tenderness nor edema.  No joint effusions. Neurologic:  Normal speech and language. No gross focal neurologic deficits are appreciated.  Skin:  Skin is warm, dry and intact. No rash noted. Psychiatric: Mood and affect are normal. Speech and behavior are normal.  ____________________________________________   LABS (all labs ordered are listed, but only abnormal results are displayed)  Labs Reviewed  COMPREHENSIVE METABOLIC PANEL - Abnormal; Notable for the following:    Sodium 134 (*)    Glucose, Bld 178 (*)    BUN 33 (*)    Total Bilirubin 1.5 (*)    GFR calc non Af Amer 53 (*)    All other components within normal limits  CBC - Abnormal; Notable for the following:    WBC 18.0 (*)    RBC 4.09 (*)    HCT 38.1 (*)    Platelets 145 (*)    All other components within normal limits  URINALYSIS COMPLETEWITH MICROSCOPIC ([facility] ONLY) - Abnormal; Notable for the following:    Color, Urine AMBER (*)    APPearance CLOUDY (*)    Hgb urine dipstick 1+ (*)    Protein, ur 100 (*)    Leukocytes, UA 3+ (*)    Bacteria, UA MANY (*)    Squamous Epithelial / LPF 0-5 (*)    All other components within normal limits  TROPONIN I - Abnormal; Notable for the following:    Troponin I 0.07 (*)    All other components within normal limits  VALPROIC ACID LEVEL - Abnormal; Notable for the following:    Valproic Acid Lvl 10 (*)    All other components within normal limits  CULTURE, BLOOD (ROUTINE X 2)  CULTURE, BLOOD (ROUTINE X 2)  C DIFFICILE QUICK SCREEN W PCR REFLEX  GASTROINTESTINAL PANEL BY PCR,  STOOL (REPLACES STOOL CULTURE)  URINE CULTURE  LIPASE, BLOOD   ____________________________________________  EKG  ED ECG REPORT I, Joanne Gavel, the attending physician, personally viewed and interpreted this ECG.   Date: 02/01/2016  EKG Time: 16:14  Rate: 69  Rhythm: normal sinus rhythm. PVCs.  Axis: normal  Intervals:none  ST&T Change: No acute ST elevation. LVH.   ____________________________________________  RADIOLOGY  CT abdomen and pelvis  IMPRESSION: Small bowel containing right inguinal hernia without obstruction.  Prominent sigmoid diverticulosis without evidence of extra luminal bowel inflammatory process, free fluid or free air.  Bilateral pelvic renal cysts without hydronephrosis. Scarring both kidneys. Nonobstructing right lower pole 7 mm calcification.  Mildly thickened bladder wall with impression upon bladder base by lobulated calcified prostate gland.  Prominent atherosclerotic changes aorta and aortic branch vessels as detailed above.  Ankylosis lower thoracic - lumbar spine with degenerative changes L2-3 through L5-S1. ____________________________________________   PROCEDURES  Procedure(s) performed: None  Critical Care performed: No  ____________________________________________   INITIAL IMPRESSION / ASSESSMENT AND PLAN / ED COURSE  Pertinent labs & imaging results that were available during my care of the patient were reviewed by me and considered in my medical decision making (see chart for details).  Derrick Sullivan is a 82 y.o. male with history of coronary artery disease, hyperlipidemia, hypertension, dementia who presents for evaluation of lower abdominal pain today as well as  nausea, vomiting and diarrhea. On exam, he is generally well-appearing and in no acute distress. Vital signs stable, he is afebrile. He did have some tenderness to palpation throughout the lower abdomen. I reviewed his labs which are notable for  leukocytosis, white blood cell count is 18,000. CMP generally unremarkable. Urinalysis is concerning for urinary tract infection. CT of the abdomen and pelvis shows no acute intra-abdominal pathology. IV zosyn given. Troponin elevated 0.07, patient denies any chest pain or difficulty breathing. Question NSTEMI versus demand ischemia. Case discussed with hospitalist for admission at 5:30 PM. Aspirin ordered. ____________________________________________   FINAL CLINICAL IMPRESSION(S) / ED DIAGNOSES  Final diagnoses:  UTI (lower urinary tract infection)  Troponin level elevated      NEW MEDICATIONS STARTED DURING THIS VISIT:  New Prescriptions   No medications on file     Note:  This document was prepared using Dragon voice recognition software and may include unintentional dictation errors.    Joanne Gavel, MD 02/01/16 [phone]

## 2016-02-01 NOTE — ED Notes (Signed)
This RN attempted to call report. Floor reports they are still in report with shift change. Floor will call this RN shortly.

## 2016-02-01 NOTE — H&P (Addendum)
[facility] [place] NAME: Derrick Sullivan    MR#:  [id number]  DATE OF BIRTH:  05/07/1924  DATE OF ADMISSION:  02/01/2016  PRIMARY CARE PHYSICIAN: Crecencio Mc, MD   REQUESTING/REFERRING PHYSICIAN: Dr Edd Fabian  CHIEF COMPLAINT:   Urinary frequency and diarrhea HISTORY OF PRESENT ILLNESS:  Derrick Sullivan  is a 82 y.o. male with a known history of Mild dementia, CAD and peripheral vascular disease who presents above complaint. As per caregiver patient had increasing urinary frequency and had a diarrhea-type bowel movement this morning. Patient denies abdominal pain. Patient has recently been on ciprofloxacin. In the emergency room he is found to have urinary tract infection. CT of the abdomen showed no acute pathology.  PAST MEDICAL HISTORY:   Past Medical History  Diagnosis Date  . CAD (coronary artery disease)   . Peripheral vascular disease ([place])   . Degenerative disk disease   . Presbyacusis   . Osteoporosis   . Duodenal ulcer 07/2004    NSAID induced  . Hyperlipidemia   . Benign prostatic hypertrophy   . Benign meningioma ([place]) 2006    s/p parietal resection  . Osteopenia   . Carotid artery stenosis, asymptomatic 2008    hemodynamically insignificant  . Diverticulosis of colon 2007  . Hypertension 09/26/2011  . Dementia     PAST SURGICAL HISTORY:   Past Surgical History  Procedure Laterality Date  . Shoulder surgery  2000    rotator cuff  . Gallbladder surgery  2000  . Brain surgery      meningioma resection    SOCIAL HISTORY:   Social History  Substance Use Topics  . Smoking status: Never Smoker   . Smokeless tobacco: Former Systems developer    Quit date: 07/05/1956  . Alcohol Use: No    FAMILY HISTORY:   Family History  Problem Relation Age of Onset  . Diabetes Mother   . Heart disease Father     DRUG ALLERGIES:   Allergies  Allergen Reactions  . Mirtazapine Rash    REVIEW OF SYSTEMS:   Review of Systems   Constitutional: Negative for fever, chills and malaise/fatigue.  HENT: Negative for ear discharge, ear pain, hearing loss, nosebleeds and sore throat.   Eyes: Negative for blurred vision and pain.  Respiratory: Negative for cough, hemoptysis, shortness of breath and wheezing.   Cardiovascular: Negative for chest pain, palpitations and leg swelling.  Gastrointestinal: Negative for nausea, vomiting, abdominal pain, diarrhea and blood in stool.  Genitourinary: Positive for frequency. Negative for dysuria.  Musculoskeletal: Negative for back pain.  Neurological: Negative for dizziness, tremors, speech change, focal weakness, seizures and headaches.  Endo/Heme/Allergies: Does not bruise/bleed easily.  Psychiatric/Behavioral: Positive for memory loss. Negative for depression, suicidal ideas and hallucinations.    MEDICATIONS AT HOME:   Prior to Admission medications   Medication Sig Start Date End Date Taking? Authorizing Provider  acetaminophen (TYLENOL) 500 MG tablet Take 1,000 mg by mouth every 6 (six) hours as needed for mild pain, fever or headache.    Yes Historical Provider, MD  acidophilus (RISAQUAD) CAPS capsule Take 1 capsule by mouth at bedtime.   Yes Historical Provider, MD  aspirin EC 81 MG tablet Take 81 mg by mouth daily.   Yes Historical Provider, MD  atenolol (TENORMIN) 25 MG tablet Take 0.5 tablets (12.5 mg total) by mouth 2 (two) times daily. 11/03/15  Yes Crecencio Mc, MD  atorvastatin (LIPITOR) 10 MG tablet Take 10 mg  by mouth at bedtime.   Yes Historical Provider, MD  calcium carbonate (OSCAL) 1500 (600 Ca) MG TABS tablet Take 600 mg of elemental calcium by mouth 2 (two) times daily with a meal.   Yes Historical Provider, MD  citalopram (CELEXA) 20 MG tablet Take 20 mg by mouth daily.   Yes Historical Provider, MD  diphenhydrAMINE (BENADRYL) 25 mg capsule Take 25 mg by mouth every 8 (eight) hours as needed for itching or allergies.    Yes Historical Provider, MD   divalproex (DEPAKOTE) 125 MG DR tablet Take 125 mg by mouth 2 (two) times daily.    Yes Historical Provider, MD  docusate sodium (COLACE) 100 MG capsule Take 100 mg by mouth 2 (two) times daily.   Yes Historical Provider, MD  donepezil (ARICEPT) 10 MG tablet Take 10 mg by mouth at bedtime.   Yes Historical Provider, MD  fluocinonide (LIDEX) 0.05 % cream Apply 1 application topically as needed (for irritation).    Yes Historical Provider, MD  furosemide (LASIX) 20 MG tablet Take 20 mg by mouth daily as needed for edema.   Yes Historical Provider, MD  memantine (NAMENDA) 5 MG tablet Take 5 mg by mouth 2 (two) times daily.   Yes Historical Provider, MD  Multiple Vitamins-Minerals (PRESERVISION AREDS 2) CAPS Take 1 capsule by mouth daily.   Yes Historical Provider, MD  polyethylene glycol (MIRALAX / GLYCOLAX) packet Take 17 g by mouth daily as needed for mild constipation.   Yes Historical Provider, MD  ciprofloxacin (CIPRO) 250 MG tablet Take 1 tablet (250 mg total) by mouth 2 (two) times daily. For 2 weeks Patient not taking: Reported on 02/01/2016 12/02/15   Crecencio Mc, MD      VITAL SIGNS:  Blood pressure 162/60, pulse 63, temperature 98.2 F (36.8 C), temperature source Oral, resp. rate 14, height 5\' 10"  (1.778 m), weight 74.844 kg (165 lb), SpO2 99 %.  PHYSICAL EXAMINATION:   Physical Exam  Constitutional: He is well-developed, well-nourished, and in no distress. No distress.  HENT:  Head: Normocephalic.  Eyes: No scleral icterus.  Neck: Normal range of motion. Neck supple. No JVD present. No tracheal deviation present.  Cardiovascular: Normal rate, regular rhythm and normal heart sounds.  Exam reveals no gallop and no friction rub.   No murmur heard. Pulmonary/Chest: Effort normal and breath sounds normal. No respiratory distress. He has no wheezes. He has no rales. He exhibits no tenderness.  Abdominal: Soft. Bowel sounds are normal. He exhibits no distension and no mass. There is  no tenderness. There is no rebound and no guarding.  Musculoskeletal: Normal range of motion. He exhibits no edema.  Neurological: He is alert.  Patient is oriented 3  Skin: Skin is warm. No rash noted. No erythema.  Psychiatric: Affect and judgment normal.      LABORATORY PANEL:   CBC  Recent Labs Lab 02/01/16 1242  WBC 18.0*  HGB 13.1  HCT 38.1*  PLT 145*   ------------------------------------------------------------------------------------------------------------------  Chemistries   Recent Labs Lab 02/01/16 1242  NA 134*  K 4.4  CL 101  CO2 24  GLUCOSE 178*  BUN 33*  CREATININE 1.18  CALCIUM 8.9  AST 23  ALT 22  ALKPHOS 60  BILITOT 1.5*   ------------------------------------------------------------------------------------------------------------------  Cardiac Enzymes  Recent Labs Lab 02/01/16 1242  TROPONINI 0.07*   ------------------------------------------------------------------------------------------------------------------  RADIOLOGY:  Ct Abdomen Pelvis W Contrast  02/01/2016  CLINICAL DATA:  82 year old male with urinary frequency. Loss of appetite. Initial encounter.  EXAM: CT ABDOMEN AND PELVIS WITH CONTRAST TECHNIQUE: Multidetector CT imaging of the abdomen and pelvis was performed using the standard protocol following bolus administration of intravenous contrast. CONTRAST:  114mL ISOVUE-300 IOPAMIDOL (ISOVUE-300) INJECTION 61% COMPARISON:  no comparison CT.  Comparison sonogram 05/01/2014. FINDINGS: Lower chest: Scarring lung bases without worrisome abnormality. Coronary artery calcifications. Heart size within normal limits. Hepatobiliary: Negative. Pancreas: Negative. Spleen: Negative. Adrenals/Urinary Tract: Bilateral parapelvic cyst. Scarring kidneys bilaterally. Nonobstructing right lower pole 7 mm calcification. Mild thickening bladder wall diffusely without focal mass identified. Impression upon the bladder base by slightly lobulated  prostate gland. Hyperplasia adrenal glands. Stomach/Bowel: Right inguinal small bowel containing hernia without obstruction. Sigmoid diverticulosis. No extra luminal bowel inflammatory process, free fluid or free air. Under distended stomach without gross abnormality. Vascular/Lymphatic: Prominent atherosclerotic changes of the abdominal aorta with areas of ectasia and mild narrowing. Mild narrowing common iliac arteries. Marked narrowing of femoral arteries. Moderate narrowing origin of the celiac artery and superior mesenteric artery. Mild to moderate narrowing origin inferior mesenteric artery. Reproductive: Enlarged lobulated prostate gland impressing upon bladder base. Musculoskeletal: Ankylosis lower thoracic and upper lumbar spine. Degenerative changes L2-3 through L5-S1 Bilateral hip joint degenerative changes. IMPRESSION: Small bowel containing right inguinal hernia without obstruction. Prominent sigmoid diverticulosis without evidence of extra luminal bowel inflammatory process, free fluid or free air. Bilateral pelvic renal cysts without hydronephrosis. Scarring both kidneys. Nonobstructing right lower pole 7 mm calcification. Mildly thickened bladder wall with impression upon bladder base by lobulated calcified prostate gland. Prominent atherosclerotic changes aorta and aortic branch vessels as detailed above. Ankylosis lower thoracic - lumbar spine with degenerative changes L2-3 through L5-S1. Electronically Signed   By: Genia Del M.D.   On: 02/01/2016 17:33    EKG:   Sinus rhythm no ST elevation or depression. There are PVCs.  IMPRESSION AND PLAN:   82 year old male with a history of mild dementia who presents with urinary frequency and found to have urinary tract infection.  1. Leukocytosis: This is due to urinary tract infection.  2. Urinary tract infection: Start Rocephin and follow up on urine culture.  3. Diarrhea: Check C. difficile as the patient was recently on  antibiotics.  4. Mild dementia: Continue Namenda and Aricept.  5. ASCVD: Continue aspirin, atenolol, atorvastatin   6. Elevated troponin: Likely demand ischemia in the setting of UTI. Continue to trend troponins. All the records are reviewed and case discussed with ED provider. Management plans discussed with the patient's wife and caregiver and they are in agreement  CODE STATUS: DO NOT RESUSCITATE  TOTAL TIME TAKING CARE OF THIS PATIENT: 45 minutes.    Nyilah Kight M.D on 02/01/2016 at 6:11 PM  Between 7am to 6pm - Pager - [phone]  After 6pm go to [email or web address] - password [email or web address] [facility]  Office  [phone]  CC: Primary care physician; Crecencio Mc, MD

## 2016-02-01 NOTE — ED Notes (Addendum)
Patient presents to the ED with urinary frequency.  Patient presents to the ED with home caregiver.  Patient denies any pain.  Patient has had lack of appetite since yesterday.  Caregiver states when she got to patient's home this morning at 6am, it appeared patient had a large bowel movement of diarrhea and there appeared to be emesis on the floor. Patient states, "I had the best bowel movement of my life today."  Patient is alert and oriented x 4. Patient does have history of dementia.   Patient states he is hard of hearing.  Patient states he does not remember vomiting or having diarrhea.

## 2016-02-01 NOTE — ED Notes (Signed)
Patient transported to CT 

## 2016-02-02 LAB — TROPONIN I
Troponin I: 0.1 ng/mL — ABNORMAL HIGH (ref <0.031)
Troponin I: 0.1 ng/mL — ABNORMAL HIGH (ref <0.031)

## 2016-02-02 MED ORDER — VANCOMYCIN HCL IN DEXTROSE 1-5 GM/200ML-% IV SOLN
1000.0000 mg | INTRAVENOUS | Status: DC
  Administered 2016-02-02: 1000 mg via INTRAVENOUS
  Filled 2016-02-02 (×2): qty 200

## 2016-02-02 MED ORDER — VANCOMYCIN HCL IN DEXTROSE 1-5 GM/200ML-% IV SOLN
1000.0000 mg | Freq: Once | INTRAVENOUS | Status: AC
  Administered 2016-02-02: 17:00:00 1000 mg via INTRAVENOUS
  Filled 2016-02-02: qty 200

## 2016-02-02 NOTE — Progress Notes (Signed)
[facility] [place] NAME: Derrick Sullivan    MR#:  [id number]  DATE OF BIRTH:  1924/02/16  SUBJECTIVE:  CHIEF COMPLAINT:   Chief Complaint  Patient presents with  . Urinary Frequency  . Diarrhea  . Emesis   Pt came with weakness, nausea-  Found to have UTI. No complains now. Sitting in chair.  REVIEW OF SYSTEMS:  CONSTITUTIONAL: No fever, fatigue or weakness.  EYES: No blurred or double vision.  EARS, NOSE, AND THROAT: No tinnitus or ear pain.  RESPIRATORY: No cough, shortness of breath, wheezing or hemoptysis.  CARDIOVASCULAR: No chest pain, orthopnea, edema.  GASTROINTESTINAL: No nausea, vomiting, diarrhea or abdominal pain.  GENITOURINARY: No dysuria, hematuria.  ENDOCRINE: No polyuria, nocturia,  HEMATOLOGY: No anemia, easy bruising or bleeding SKIN: No rash or lesion. MUSCULOSKELETAL: No joint pain or arthritis.   NEUROLOGIC: No tingling, numbness, weakness.  PSYCHIATRY: No anxiety or depression.   ROS  DRUG ALLERGIES:   Allergies  Allergen Reactions  . Mirtazapine Rash    VITALS:  Blood pressure 115/42, pulse 56, temperature 97.7 F (36.5 C), temperature source Oral, resp. rate 20, height 5\' 10"  (1.778 m), weight 72.938 kg (160 lb 12.8 oz), SpO2 99 %.  PHYSICAL EXAMINATION:  GENERAL:  82 y.o.-year-old patient lying in the bed with no acute distress.  EYES: Pupils equal, round, reactive to light and accommodation. No scleral icterus. Extraocular muscles intact.  HEENT: Head atraumatic, normocephalic. Oropharynx and nasopharynx clear.  NECK:  Supple, no jugular venous distention. No thyroid enlargement, no tenderness.  LUNGS: Normal breath sounds bilaterally, no wheezing, rales,rhonchi or crepitation. No use of accessory muscles of respiration.  CARDIOVASCULAR: S1, S2 normal. No murmurs, rubs, or gallops.  ABDOMEN: Soft, nontender, nondistended. Bowel sounds present. No organomegaly or mass.  EXTREMITIES: No pedal  edema, cyanosis, or clubbing.  NEUROLOGIC: Cranial nerves II through XII are intact. Muscle strength 4/5 in all extremities. Sensation intact. Gait not checked.  PSYCHIATRIC: The patient is alert and oriented x 3.  SKIN: No obvious rash, lesion, or ulcer.   Physical Exam LABORATORY PANEL:   CBC  Recent Labs Lab 02/01/16 1242  WBC 18.0*  HGB 13.1  HCT 38.1*  PLT 145*   ------------------------------------------------------------------------------------------------------------------  Chemistries   Recent Labs Lab 02/01/16 1242  NA 134*  K 4.4  CL 101  CO2 24  GLUCOSE 178*  BUN 33*  CREATININE 1.18  CALCIUM 8.9  AST 23  ALT 22  ALKPHOS 60  BILITOT 1.5*   ------------------------------------------------------------------------------------------------------------------  Cardiac Enzymes  Recent Labs Lab 02/02/16 0149 02/02/16 0719  TROPONINI 0.10* 0.10*   ------------------------------------------------------------------------------------------------------------------  RADIOLOGY:  Ct Abdomen Pelvis W Contrast  02/01/2016  CLINICAL DATA:  82 year old male with urinary frequency. Loss of appetite. Initial encounter. EXAM: CT ABDOMEN AND PELVIS WITH CONTRAST TECHNIQUE: Multidetector CT imaging of the abdomen and pelvis was performed using the standard protocol following bolus administration of intravenous contrast. CONTRAST:  163mL ISOVUE-300 IOPAMIDOL (ISOVUE-300) INJECTION 61% COMPARISON:  no comparison CT.  Comparison sonogram 05/01/2014. FINDINGS: Lower chest: Scarring lung bases without worrisome abnormality. Coronary artery calcifications. Heart size within normal limits. Hepatobiliary: Negative. Pancreas: Negative. Spleen: Negative. Adrenals/Urinary Tract: Bilateral parapelvic cyst. Scarring kidneys bilaterally. Nonobstructing right lower pole 7 mm calcification. Mild thickening bladder wall diffusely without focal mass identified. Impression upon the bladder base by  slightly lobulated prostate gland. Hyperplasia adrenal glands. Stomach/Bowel: Right inguinal small bowel containing hernia without obstruction. Sigmoid diverticulosis. No extra luminal bowel inflammatory process,  free fluid or free air. Under distended stomach without gross abnormality. Vascular/Lymphatic: Prominent atherosclerotic changes of the abdominal aorta with areas of ectasia and mild narrowing. Mild narrowing common iliac arteries. Marked narrowing of femoral arteries. Moderate narrowing origin of the celiac artery and superior mesenteric artery. Mild to moderate narrowing origin inferior mesenteric artery. Reproductive: Enlarged lobulated prostate gland impressing upon bladder base. Musculoskeletal: Ankylosis lower thoracic and upper lumbar spine. Degenerative changes L2-3 through L5-S1 Bilateral hip joint degenerative changes. IMPRESSION: Small bowel containing right inguinal hernia without obstruction. Prominent sigmoid diverticulosis without evidence of extra luminal bowel inflammatory process, free fluid or free air. Bilateral pelvic renal cysts without hydronephrosis. Scarring both kidneys. Nonobstructing right lower pole 7 mm calcification. Mildly thickened bladder wall with impression upon bladder base by lobulated calcified prostate gland. Prominent atherosclerotic changes aorta and aortic branch vessels as detailed above. Ankylosis lower thoracic - lumbar spine with degenerative changes L2-3 through L5-S1. Electronically Signed   By: Genia Del M.D.   On: 02/01/2016 17:33    ASSESSMENT AND PLAN:   Active Problems:   UTI (lower urinary tract infection)  82 year old male with a history of mild dementia who presents with urinary frequency and found to have urinary tract infection.  1. Leukocytosis: This is due to urinary tract infection.  2. Urinary tract infection: on Rocephin and follow up on urine culture.  3. Diarrhea: Checked C. difficile as the patient was recently on  antibiotics.negative.  4. Mild dementia: Continue Namenda and Aricept.  5. ASCVD: Continue aspirin, atenolol, atorvastatin  6. Elevated troponin: Likely demand ischemia in the setting of UTI.    Remained stable on further follow up.  7. Bacteremia   Give vanc IV , repeat Bl cx again and follow final results.    All the records are reviewed and case discussed with Care Management/Social Workerr. Management plans discussed with the patient, family and they are in agreement.  CODE STATUS: DNR  TOTAL TIME TAKING CARE OF THIS PATIENT: 35 minutes.   Discussed with pt, his wife and a care taker in room.  POSSIBLE D/C IN 1-2 DAYS, DEPENDING ON CLINICAL CONDITION.   Vaughan Basta M.D on 02/02/2016   Between 7am to 6pm - Pager - [phone]  After 6pm go to [email or web address] - password [email or web address] [facility]  Office  [phone]  CC: Primary care physician; Crecencio Mc, MD  Note: This dictation was prepared with Dragon dictation along with smaller phrase technology. Any transcriptional errors that result from this process are unintentional.

## 2016-02-02 NOTE — Progress Notes (Signed)
ANTIBIOTIC CONSULT NOTE - INITIAL  Pharmacy Consult for Vancomycin  Indication: bacteremia  Allergies  Allergen Reactions  . Mirtazapine Rash    Patient Measurements: Height: 5\' 10"  (177.8 cm) Weight: 160 lb 12.8 oz (72.938 kg) IBW/kg (Calculated) : 73 Adjusted Body Weight:   Vital Signs: Temp: 97.7 F (36.5 C) (05/30 1308) Temp Source: Oral (05/30 1308) BP: 115/42 mmHg (05/30 1308) Pulse Rate: 56 (05/30 1308) Intake/Output from previous day: 05/29 0701 - 05/30 0700 In: -  Out: 25 [Urine:25] Intake/Output from this shift: Total I/O In: 240 [P.O.:240] Out: -   Labs:  Recent Labs  02/01/16 1242  WBC 18.0*  HGB 13.1  PLT 145*  CREATININE 1.18   Estimated Creatinine Clearance: 43.8 mL/min (by C-G formula based on Cr of 1.18). No results for input(s): VANCOTROUGH, VANCOPEAK, VANCORANDOM, GENTTROUGH, GENTPEAK, GENTRANDOM, TOBRATROUGH, TOBRAPEAK, TOBRARND, AMIKACINPEAK, AMIKACINTROU, AMIKACIN in the last 72 hours.   Microbiology: Recent Results (from the past 720 hour(s))  Blood culture (routine x 2)     Status: None (Preliminary result)   Collection Time: 02/01/16  4:36 PM  Result Value Ref Range Status   Specimen Description BLOOD RIGHT ARM  Final   Special Requests BOTTLES DRAWN AEROBIC AND ANAEROBIC  10CC  Final   Culture  Setup Time   Final    GRAM POSITIVE COCCI ANAEROBIC BOTTLE ONLY Organism ID to follow CRITICAL RESULT CALLED TO, READ BACK BY AND VERIFIED WITH: called HANK ZOMPA@1137  ON 02/02/16 BY HKP    Culture GRAM POSITIVE COCCI ANAEROBIC BOTTLE ONLY   Final   Report Status PENDING  Incomplete  Blood culture (routine x 2)     Status: None (Preliminary result)   Collection Time: 02/01/16  4:36 PM  Result Value Ref Range Status   Specimen Description BLOOD LEFT ARM  Final   Special Requests BOTTLES DRAWN AEROBIC AND ANAEROBIC  6CC  Final   Culture NO GROWTH < 24 HOURS  Final   Report Status PENDING  Incomplete  Blood Culture ID Panel (Reflexed)      Status: Abnormal   Collection Time: 02/01/16  4:36 PM  Result Value Ref Range Status   Enterococcus species NOT DETECTED NOT DETECTED Final   Vancomycin resistance NOT DETECTED NOT DETECTED Final   Listeria monocytogenes NOT DETECTED NOT DETECTED Final   Staphylococcus species DETECTED (A) NOT DETECTED Final    Comment: CALLED HANK ZOMPA @1137  ON 02/02/16 BY HKP   Staphylococcus aureus NOT DETECTED NOT DETECTED Final   Methicillin resistance DETECTED (A) NOT DETECTED Final    Comment: CALLED HANK ZOMPA@1137  ON 02/02/16 BY HKP   Streptococcus species NOT DETECTED NOT DETECTED Final   Streptococcus agalactiae NOT DETECTED NOT DETECTED Final   Streptococcus pneumoniae NOT DETECTED NOT DETECTED Final   Streptococcus pyogenes NOT DETECTED NOT DETECTED Final   Acinetobacter baumannii NOT DETECTED NOT DETECTED Final   Enterobacteriaceae species NOT DETECTED NOT DETECTED Final   Enterobacter cloacae complex NOT DETECTED NOT DETECTED Final   Escherichia coli NOT DETECTED NOT DETECTED Final   Klebsiella oxytoca NOT DETECTED NOT DETECTED Final   Klebsiella pneumoniae NOT DETECTED NOT DETECTED Final   Proteus species NOT DETECTED NOT DETECTED Final   Serratia marcescens NOT DETECTED NOT DETECTED Final   Carbapenem resistance NOT DETECTED NOT DETECTED Final   Haemophilus influenzae NOT DETECTED NOT DETECTED Final   Neisseria meningitidis NOT DETECTED NOT DETECTED Final   Pseudomonas aeruginosa NOT DETECTED NOT DETECTED Final   Candida albicans NOT DETECTED NOT DETECTED Final  Candida glabrata NOT DETECTED NOT DETECTED Final   Candida krusei NOT DETECTED NOT DETECTED Final   Candida parapsilosis NOT DETECTED NOT DETECTED Final   Candida tropicalis NOT DETECTED NOT DETECTED Final  C difficile quick scan w PCR reflex     Status: None   Collection Time: 02/01/16  7:18 PM  Result Value Ref Range Status   C Diff antigen NEGATIVE NEGATIVE Final   C Diff toxin NEGATIVE NEGATIVE Final   C Diff  interpretation Negative for C. difficile  Final  Gastrointestinal Panel by PCR , Stool     Status: None   Collection Time: 02/01/16  7:18 PM  Result Value Ref Range Status   Campylobacter species NOT DETECTED NOT DETECTED Final   Plesimonas shigelloides NOT DETECTED NOT DETECTED Final   Salmonella species NOT DETECTED NOT DETECTED Final   Yersinia enterocolitica NOT DETECTED NOT DETECTED Final   Vibrio species NOT DETECTED NOT DETECTED Final   Vibrio cholerae NOT DETECTED NOT DETECTED Final   Enteroaggregative E coli (EAEC) NOT DETECTED NOT DETECTED Final   Enteropathogenic E coli (EPEC) NOT DETECTED NOT DETECTED Final   Enterotoxigenic E coli (ETEC) NOT DETECTED NOT DETECTED Final   Shiga like toxin producing E coli (STEC) NOT DETECTED NOT DETECTED Final   E. coli O157 NOT DETECTED NOT DETECTED Final   Shigella/Enteroinvasive E coli (EIEC) NOT DETECTED NOT DETECTED Final   Cryptosporidium NOT DETECTED NOT DETECTED Final   Cyclospora cayetanensis NOT DETECTED NOT DETECTED Final   Entamoeba histolytica NOT DETECTED NOT DETECTED Final   Giardia lamblia NOT DETECTED NOT DETECTED Final   Adenovirus F40/41 NOT DETECTED NOT DETECTED Final   Astrovirus NOT DETECTED NOT DETECTED Final   Norovirus GI/GII NOT DETECTED NOT DETECTED Final   Rotavirus A NOT DETECTED NOT DETECTED Final   Sapovirus (I, II, IV, and V) NOT DETECTED NOT DETECTED Final    Medical History: Past Medical History  Diagnosis Date  . CAD (coronary artery disease)   . Peripheral vascular disease ([place])   . Degenerative disk disease   . Presbyacusis   . Osteoporosis   . Duodenal ulcer 07/2004    NSAID induced  . Hyperlipidemia   . Benign prostatic hypertrophy   . Benign meningioma ([place]) 2006    s/p parietal resection  . Osteopenia   . Carotid artery stenosis, asymptomatic 2008    hemodynamically insignificant  . Diverticulosis of colon 2007  . Hypertension 09/26/2011  . Dementia     Medications:  Prescriptions  prior to admission  Medication Sig Dispense Refill Last Dose  . acetaminophen (TYLENOL) 500 MG tablet Take 1,000 mg by mouth every 6 (six) hours as needed for mild pain, fever or headache.    Past Month at Unknown time  . acidophilus (RISAQUAD) CAPS capsule Take 1 capsule by mouth at bedtime.   01/31/2016 at Unknown time  . aspirin EC 81 MG tablet Take 81 mg by mouth daily.   01/31/2016 at 0800  . atenolol (TENORMIN) 25 MG tablet Take 0.5 tablets (12.5 mg total) by mouth 2 (two) times daily. 90 tablet 1 01/31/2016 at 1700  . atorvastatin (LIPITOR) 10 MG tablet Take 10 mg by mouth at bedtime.   01/31/2016 at Unknown time  . calcium carbonate (OSCAL) 1500 (600 Ca) MG TABS tablet Take 600 mg of elemental calcium by mouth 2 (two) times daily with a meal.   01/31/2016 at Unknown time  . citalopram (CELEXA) 20 MG tablet Take 20 mg by mouth daily.  01/31/2016 at Unknown time  . diphenhydrAMINE (BENADRYL) 25 mg capsule Take 25 mg by mouth every 8 (eight) hours as needed for itching or allergies.    Past Month at Unknown time  . divalproex (DEPAKOTE) 125 MG DR tablet Take 125 mg by mouth 2 (two) times daily.    01/31/2016 at Unknown time  . docusate sodium (COLACE) 100 MG capsule Take 100 mg by mouth 2 (two) times daily.   01/31/2016 at Unknown time  . donepezil (ARICEPT) 10 MG tablet Take 10 mg by mouth at bedtime.   01/31/2016 at Unknown time  . fluocinonide (LIDEX) 0.05 % cream Apply 1 application topically as needed (for irritation).    Past Month at Unknown time  . furosemide (LASIX) 20 MG tablet Take 20 mg by mouth daily as needed for edema.   Past Month at Unknown time  . memantine (NAMENDA) 5 MG tablet Take 5 mg by mouth 2 (two) times daily.   01/31/2016 at Unknown time  . Multiple Vitamins-Minerals (PRESERVISION AREDS 2) CAPS Take 1 capsule by mouth daily.   01/31/2016 at Unknown time  . polyethylene glycol (MIRALAX / GLYCOLAX) packet Take 17 g by mouth daily as needed for mild constipation.   Past Month at  Unknown time  . ciprofloxacin (CIPRO) 250 MG tablet Take 1 tablet (250 mg total) by mouth 2 (two) times daily. For 2 weeks (Patient not taking: Reported on 02/01/2016) 28 tablet 0    Scheduled:  . acidophilus  1 capsule Oral QHS  . aspirin EC  81 mg Oral Daily  . atenolol  12.5 mg Oral BID  . atorvastatin  10 mg Oral QHS  . calcium carbonate  600 mg of elemental calcium Oral BID WC  . cefTRIAXone (ROCEPHIN)  IV  1 g Intravenous Q24H  . citalopram  20 mg Oral Daily  . divalproex  125 mg Oral BID  . docusate sodium  100 mg Oral BID  . donepezil  10 mg Oral QHS  . enoxaparin (LOVENOX) injection  40 mg Subcutaneous Q24H  . memantine  5 mg Oral BID  . multivitamin-lutein  1 capsule Oral Daily  . vancomycin  1,000 mg Intravenous Once  . vancomycin  1,000 mg Intravenous Q18H   Assessment: Pharmacy consulted to dose and monitor Vancomycin in this 82 year old male. Patient had positive blood culture for staph species with MECa resistance.   Goal of Therapy: +  Vancomycin trough level 15-20 mcg/ml  Plan:  Attempted Paging MD to discuss culture results; however wasn't able to get in touch with MD. Will follow up tomorrow.   Will give Vancomycin 1000 mg IV x 1 and will then start Vancomycin 1 g IV q18 hours.  Derrick Sullivan D 02/02/2016,3:03 PM

## 2016-02-02 NOTE — Evaluation (Signed)
Physical Therapy Evaluation Patient Details Name: Derrick Sullivan MRN: [id number] DOB: Oct 18, 1923 Today's Date: 02/02/2016   History of Present Illness  Pt admitted for UTI. Pt with history of dementia, CAD, and PVD.   Clinical Impression  Pt is a pleasant 82 year old male who was admitted for UTI. Pt performs bed mobility with mod I (using rails), transfers with min assist and SPC, and ambulation with cga and SPC. Pt asks same questions over and over regarding where therapist lives. Recommend further ambulation with rw for improved stability as pt slightly unsteady with SPC. Pt opposed to using rw and does not want one.  Pt demonstrates deficits with balance/strength/mobility. Would benefit from skilled PT to address above deficits and promote optimal return to PLOF. Recommend transition to [place] upon discharge from acute hospitalization.       Follow Up Recommendations Home health PT    Equipment Recommendations  Rolling walker with 5" wheels    Recommendations for Other Services       Precautions / Restrictions Precautions Precautions: Fall Restrictions Weight Bearing Restrictions: No      Mobility  Bed Mobility Overal bed mobility: Modified Independent             General bed mobility comments: safe technique performed.   Transfers Overall transfer level: Needs assistance Equipment used: Straight cane Transfers: Sit to/from Stand Sit to Stand: Min assist         General transfer comment: Pt has difficulty rising from lower surface. Once standing, pt with fair balance using SPC, reaching for furniture for stability.  Ambulation/Gait Ambulation/Gait assistance: Min guard Ambulation Distance (Feet): 50 Feet Assistive device: Straight cane Gait Pattern/deviations: Step-through pattern     General Gait Details: ambulated using SPC with fair balance. Pt with reciprocal gait pattern, however would benefit from using RW for increased stability.  Stairs             Wheelchair Mobility    Modified Rankin (Stroke Patients Only)       Balance Overall balance assessment: History of Falls;Needs assistance Sitting-balance support: Feet supported Sitting balance-Leahy Scale: Normal     Standing balance support: Single extremity supported Standing balance-Leahy Scale: Fair                               Pertinent Vitals/Pain Pain Assessment: No/denies pain    Home Living Family/patient expects to be discharged to:: Private residence Living Arrangements: Spouse/significant other Available Help at Discharge: Family;Personal care attendant (has caregivers during the day and at night) Type of Home: House Home Access: Stairs to enter Entrance Stairs-Rails: Right Entrance Stairs-Number of Steps: 2 Home Layout: One level (has basement, but does not go down there) Home Equipment: Cane - single point      Prior Function Level of Independence: Independent with assistive device(s)         Comments: previously mowing lawn, has had multiple falls     Hand Dominance        Extremity/Trunk Assessment   Upper Extremity Assessment: Generalized weakness (grossly 4+/5)           Lower Extremity Assessment: Generalized weakness (B LE grossly 4/5)         Communication   Communication: No difficulties  Cognition Arousal/Alertness: Awake/alert Behavior During Therapy: WFL for tasks assessed/performed Overall Cognitive Status: Within Functional Limits for tasks assessed  General Comments      Exercises        Assessment/Plan    PT Assessment Patient needs continued PT services  PT Diagnosis Difficulty walking;Generalized weakness   PT Problem List Decreased strength;Decreased mobility;Decreased balance  PT Treatment Interventions Gait training;DME instruction;Balance training   PT Goals (Current goals can be found in the Care Plan section) Acute Rehab PT Goals Patient Stated  Goal: to go home PT Goal Formulation: With patient Time For Goal Achievement: 02/16/16 Potential to Achieve Goals: Good    Frequency Min 2X/week   Barriers to discharge        Co-evaluation               End of Session Equipment Utilized During Treatment: Gait belt Activity Tolerance: Patient tolerated treatment well Patient left: in chair;with chair alarm set Nurse Communication: Mobility status         Time: [id number] PT Time Calculation (min) (ACUTE ONLY): 24 min   Charges:   PT Evaluation $PT Eval Moderate Complexity: 1 Procedure     PT G Codes:        Kaysin Brock 02-07-16, 1:18 PM Greggory Stallion, PT, DPT [phone]

## 2016-02-02 NOTE — Care Management (Signed)
Admitted to [facility] with the diagnosis of UTI. Lives with wife, [place] [phone]). Last seen Dr. Derrel Nip 12/02/15. Hard of hearing. Services are provided by [facility] from 10:00am-6:00pm except Friday, then 6:00pm -11:00pm 7 days a week. Last fall was 4-5 weeks ago. No seizures. No home oxygen. No skilled facility. Good appetite, but has lost weight. Uses a cane to aid in ambulation. Needs help with bath and dressing. Self feeds. Gets prescriptions filled at [facility]. States someone will bring wife to hospital later today. Physical therapy evaluation pending. Shelbie Ammons RN MSN CCM Care Management [phone]

## 2016-02-02 NOTE — Progress Notes (Signed)
Dr. Estanislado Pandy notified pt's troponin levels increased, last one at 0.10. Pt has had his aspirin and Lovenox, MD states continue to monitor and reevaluate.

## 2016-02-03 ENCOUNTER — Telehealth: Payer: Self-pay | Admitting: *Deleted

## 2016-02-03 LAB — BLOOD CULTURE ID PANEL (REFLEXED)
Acinetobacter baumannii: NOT DETECTED
CANDIDA GLABRATA: NOT DETECTED
CANDIDA KRUSEI: NOT DETECTED
CANDIDA PARAPSILOSIS: NOT DETECTED
Candida albicans: NOT DETECTED
Candida tropicalis: NOT DETECTED
Carbapenem resistance: NOT DETECTED
ENTEROBACTER CLOACAE COMPLEX: NOT DETECTED
ESCHERICHIA COLI: NOT DETECTED
Enterobacteriaceae species: NOT DETECTED
Enterococcus species: NOT DETECTED
Haemophilus influenzae: NOT DETECTED
KLEBSIELLA OXYTOCA: NOT DETECTED
KLEBSIELLA PNEUMONIAE: NOT DETECTED
LISTERIA MONOCYTOGENES: NOT DETECTED
Methicillin resistance: DETECTED — AB
NEISSERIA MENINGITIDIS: NOT DETECTED
PROTEUS SPECIES: NOT DETECTED
Pseudomonas aeruginosa: NOT DETECTED
SERRATIA MARCESCENS: NOT DETECTED
STREPTOCOCCUS AGALACTIAE: NOT DETECTED
STREPTOCOCCUS SPECIES: NOT DETECTED
Staphylococcus aureus (BCID): NOT DETECTED
Staphylococcus species: DETECTED — AB
Streptococcus pneumoniae: NOT DETECTED
Streptococcus pyogenes: NOT DETECTED
Vancomycin resistance: NOT DETECTED

## 2016-02-03 LAB — CBC
HCT: 30.9 % — ABNORMAL LOW (ref 40.0–52.0)
HEMOGLOBIN: 10.7 g/dL — AB (ref 13.0–18.0)
MCH: 32.3 pg (ref 26.0–34.0)
MCHC: 34.6 g/dL (ref 32.0–36.0)
MCV: 93.2 fL (ref 80.0–100.0)
Platelets: 138 10*3/uL — ABNORMAL LOW (ref 150–440)
RBC: 3.31 MIL/uL — ABNORMAL LOW (ref 4.40–5.90)
RDW: 13.2 % (ref 11.5–14.5)
WBC: 7.5 10*3/uL (ref 3.8–10.6)

## 2016-02-03 MED ORDER — ENSURE ENLIVE PO LIQD
237.0000 mL | Freq: Two times a day (BID) | ORAL | Status: DC
  Administered 2016-02-03 (×2): 237 mL via ORAL

## 2016-02-03 MED ORDER — CEPHALEXIN 500 MG PO CAPS: 500.0000 mg | ORAL_CAPSULE | Freq: Two times a day (BID) | ORAL | Status: DC

## 2016-02-03 MED ORDER — ENSURE ENLIVE PO LIQD: 237.0000 mL | Freq: Two times a day (BID) | ORAL | Status: DC

## 2016-02-03 NOTE — Telephone Encounter (Signed)
Patient will D/C from [facility] on today, he was admitted for a UTI  Please advise a time and place on Dr.Tullo schedule to place patient with in a week.

## 2016-02-03 NOTE — Discharge Summary (Signed)
[facility] [place] NAME: Derrick Sullivan    MR#:  [id number]  DATE OF BIRTH:  17-Nov-1923  DATE OF ADMISSION:  02/01/2016 ADMITTING PHYSICIAN: Bettey Costa, MD  DATE OF DISCHARGE: 02/03/2016  PRIMARY CARE PHYSICIAN: Crecencio Mc, MD    ADMISSION DIAGNOSIS:  UTI (lower urinary tract infection) [N39.0] Troponin level elevated [R79.89]  DISCHARGE DIAGNOSIS:  Ecoli UTI  SECONDARY DIAGNOSIS:   Past Medical History  Diagnosis Date  . CAD (coronary artery disease)   . Peripheral vascular disease ([place])   . Degenerative disk disease   . Presbyacusis   . Osteoporosis   . Duodenal ulcer 07/2004    NSAID induced  . Hyperlipidemia   . Benign prostatic hypertrophy   . Benign meningioma ([place]) 2006    s/p parietal resection  . Osteopenia   . Carotid artery stenosis, asymptomatic 2008    hemodynamically insignificant  . Diverticulosis of colon 2007  . Hypertension 09/26/2011  . Dementia     HOSPITAL COURSE:   82 year old male with a history of mild dementia who presents with urinary frequency and found to have urinary tract infection.  1. Leukocytosis: This is due to urinary tract infection. -UC shows Ecoli Pt doing well change to po keflex Cranberry juice recommended  2. Urinary tract infection: on Rocephin  -change to po keflex 3. Diarrhea: Checked C. difficile as the patient was recently on antibiotics.negative.  4. Mild dementia: Continue Namenda and Aricept.  5. ASCVD: Continue aspirin, atenolol, atorvastatin  6. Elevated troponin: Likely demand ischemia in the setting of UTI.   Remained stable on further follow up.  Overall stable Repeat BC negative D/c home with HHPT 7. Bacteremia  Give vanc IV , repeat Bl cx again and follow final results CONSULTS OBTAINED:     DRUG ALLERGIES:   Allergies  Allergen Reactions  . Mirtazapine Rash    DISCHARGE MEDICATIONS:   Current Discharge Medication List     START taking these medications   Details  cephALEXin (KEFLEX) 500 MG capsule Take 1 capsule (500 mg total) by mouth 2 (two) times daily. Qty: 10 capsule, Refills: 0    feeding supplement, ENSURE ENLIVE, (ENSURE ENLIVE) LIQD Take 237 mLs by mouth 2 (two) times daily between meals. Qty: 237 mL, Refills: 12      CONTINUE these medications which have NOT CHANGED   Details  acetaminophen (TYLENOL) 500 MG tablet Take 1,000 mg by mouth every 6 (six) hours as needed for mild pain, fever or headache.     acidophilus (RISAQUAD) CAPS capsule Take 1 capsule by mouth at bedtime.    aspirin EC 81 MG tablet Take 81 mg by mouth daily.    atenolol (TENORMIN) 25 MG tablet Take 0.5 tablets (12.5 mg total) by mouth 2 (two) times daily. Qty: 90 tablet, Refills: 1    atorvastatin (LIPITOR) 10 MG tablet Take 10 mg by mouth at bedtime.    calcium carbonate (OSCAL) 1500 (600 Ca) MG TABS tablet Take 600 mg of elemental calcium by mouth 2 (two) times daily with a meal.    citalopram (CELEXA) 20 MG tablet Take 20 mg by mouth daily.    diphenhydrAMINE (BENADRYL) 25 mg capsule Take 25 mg by mouth every 8 (eight) hours as needed for itching or allergies.     divalproex (DEPAKOTE) 125 MG DR tablet Take 125 mg by mouth 2 (two) times daily.     docusate sodium (COLACE) 100 MG capsule Take 100 mg  by mouth 2 (two) times daily.    donepezil (ARICEPT) 10 MG tablet Take 10 mg by mouth at bedtime.    fluocinonide (LIDEX) 0.05 % cream Apply 1 application topically as needed (for irritation).     furosemide (LASIX) 20 MG tablet Take 20 mg by mouth daily as needed for edema.    memantine (NAMENDA) 5 MG tablet Take 5 mg by mouth 2 (two) times daily.    Multiple Vitamins-Minerals (PRESERVISION AREDS 2) CAPS Take 1 capsule by mouth daily.    polyethylene glycol (MIRALAX / GLYCOLAX) packet Take 17 g by mouth daily as needed for mild constipation.      STOP taking these medications     ciprofloxacin (CIPRO) 250 MG  tablet         If you experience worsening of your admission symptoms, develop shortness of breath, life threatening emergency, suicidal or homicidal thoughts you must seek medical attention immediately by calling 911 or calling your MD immediately  if symptoms less severe.  You Must read complete instructions/literature along with all the possible adverse reactions/side effects for all the Medicines you take and that have been prescribed to you. Take any new Medicines after you have completely understood and accept all the possible adverse reactions/side effects.   Please note  You were cared for by a hospitalist during your hospital stay. If you have any questions about your discharge medications or the care you received while you were in the hospital after you are discharged, you can call the unit and asked to speak with the hospitalist on call if the hospitalist that took care of you is not available. Once you are discharged, your primary care physician will handle any further medical issues. Please note that NO REFILLS for any discharge medications will be authorized once you are discharged, as it is imperative that you return to your primary care physician (or establish a relationship with a primary care physician if you do not have one) for your aftercare needs so that they can reassess your need for medications and monitor your lab values. Today   SUBJECTIVE    Doing well. Wants to go home VITAL SIGNS:  Blood pressure 152/50, pulse 51, temperature 98.2 F (36.8 C), temperature source Oral, resp. rate 20, height 5\' 10"  (1.778 m), weight 72.938 kg (160 lb 12.8 oz), SpO2 100 %.  I/O:   Intake/Output Summary (Last 24 hours) at 02/03/16 1422 Last data filed at 02/03/16 1300  Gross per 24 hour  Intake 3161.25 ml  Output      0 ml  Net 3161.25 ml    PHYSICAL EXAMINATION:  GENERAL:  82 y.o.-year-old patient lying in the bed with no acute distress.  EYES: Pupils equal, round,  reactive to light and accommodation. No scleral icterus. Extraocular muscles intact.  HEENT: Head atraumatic, normocephalic. Oropharynx and nasopharynx clear.  NECK:  Supple, no jugular venous distention. No thyroid enlargement, no tenderness.  LUNGS: Normal breath sounds bilaterally, no wheezing, rales,rhonchi or crepitation. No use of accessory muscles of respiration.  CARDIOVASCULAR: S1, S2 normal. No murmurs, rubs, or gallops.  ABDOMEN: Soft, non-tender, non-distended. Bowel sounds present. No organomegaly or mass.  EXTREMITIES: No pedal edema, cyanosis, or clubbing.  NEUROLOGIC: Cranial nerves II through XII are intact. Muscle strength 5/5 in all extremities. Sensation intact. Gait not checked.  PSYCHIATRIC: The patient is alert and oriented x 3.  SKIN: No obvious rash, lesion, or ulcer.   DATA REVIEW:   CBC   Recent Labs Lab  02/03/16 0450  WBC 7.5  HGB 10.7*  HCT 30.9*  PLT 138*    Chemistries   Recent Labs Lab 02/01/16 1242  NA 134*  K 4.4  CL 101  CO2 24  GLUCOSE 178*  BUN 33*  CREATININE 1.18  CALCIUM 8.9  AST 23  ALT 22  ALKPHOS 60  BILITOT 1.5*    Microbiology Results   Recent Results (from the past 240 hour(s))  Blood culture (routine x 2)     Status: Abnormal (Preliminary result)   Collection Time: 02/01/16  4:36 PM  Result Value Ref Range Status   Specimen Description BLOOD RIGHT ARM  Final   Special Requests BOTTLES DRAWN AEROBIC AND ANAEROBIC  10CC  Final   Culture  Setup Time   Final    GRAM POSITIVE COCCI ANAEROBIC BOTTLE ONLY CRITICAL RESULT CALLED TO, READ BACK BY AND VERIFIED WITH: called HANK ZOMPA@1137  ON 02/02/16 BY HKP    Culture STAPHYLOCOCCUS SPECIES ANAEROBIC BOTTLE ONLY  (A)  Final   Report Status PENDING  Incomplete  Blood culture (routine x 2)     Status: None (Preliminary result)   Collection Time: 02/01/16  4:36 PM  Result Value Ref Range Status   Specimen Description BLOOD LEFT ARM  Final   Special Requests BOTTLES DRAWN  AEROBIC AND ANAEROBIC  6CC  Final   Culture NO GROWTH 2 DAYS  Final   Report Status PENDING  Incomplete  Blood Culture ID Panel (Reflexed)     Status: Abnormal   Collection Time: 02/01/16  4:36 PM  Result Value Ref Range Status   Enterococcus species NOT DETECTED NOT DETECTED Final   Vancomycin resistance NOT DETECTED NOT DETECTED Final   Listeria monocytogenes NOT DETECTED NOT DETECTED Final   Staphylococcus species DETECTED (A) NOT DETECTED Corrected    Comment: CRITICAL RESULT CALLED TO, READ BACK BY AND VERIFIED WITH: CALLED HANK ZOMPA @1137  ON 02/02/16 BY HKP CORRECTED ON 05/31 AT 1156: PREVIOUSLY REPORTED AS DETECTED CALLED HANK ZOMPA @1137  ON 02/02/16 BY HKP    Staphylococcus aureus NOT DETECTED NOT DETECTED Final   Methicillin resistance DETECTED (A) NOT DETECTED Corrected    Comment: CRITICAL RESULT CALLED TO, READ BACK BY AND VERIFIED WITH: CALLED HANK ZOMPA@1137  ON 02/02/16 BY HKP CORRECTED ON 05/31 AT 1156: PREVIOUSLY REPORTED AS DETECTED CALLED HANK ZOMPA@1137  ON 02/02/16 BY HKP    Streptococcus species NOT DETECTED NOT DETECTED Final   Streptococcus agalactiae NOT DETECTED NOT DETECTED Final   Streptococcus pneumoniae NOT DETECTED NOT DETECTED Final   Streptococcus pyogenes NOT DETECTED NOT DETECTED Final   Acinetobacter baumannii NOT DETECTED NOT DETECTED Final   Enterobacteriaceae species NOT DETECTED NOT DETECTED Final   Enterobacter cloacae complex NOT DETECTED NOT DETECTED Final   Escherichia coli NOT DETECTED NOT DETECTED Final   Klebsiella oxytoca NOT DETECTED NOT DETECTED Final   Klebsiella pneumoniae NOT DETECTED NOT DETECTED Final   Proteus species NOT DETECTED NOT DETECTED Final   Serratia marcescens NOT DETECTED NOT DETECTED Final   Carbapenem resistance NOT DETECTED NOT DETECTED Final   Haemophilus influenzae NOT DETECTED NOT DETECTED Final   Neisseria meningitidis NOT DETECTED NOT DETECTED Final   Pseudomonas aeruginosa NOT DETECTED NOT DETECTED Final    Candida albicans NOT DETECTED NOT DETECTED Final   Candida glabrata NOT DETECTED NOT DETECTED Final   Candida krusei NOT DETECTED NOT DETECTED Final   Candida parapsilosis NOT DETECTED NOT DETECTED Final   Candida tropicalis NOT DETECTED NOT DETECTED Final  Urine culture  Status: Abnormal (Preliminary result)   Collection Time: 02/01/16  4:40 PM  Result Value Ref Range Status   Specimen Description URINE, RANDOM  Final   Special Requests NONE  Final   Culture >=100,000 COLONIES/mL ESCHERICHIA COLI (A)  Final   Report Status PENDING  Incomplete  C difficile quick scan w PCR reflex     Status: None   Collection Time: 02/01/16  7:18 PM  Result Value Ref Range Status   C Diff antigen NEGATIVE NEGATIVE Final   C Diff toxin NEGATIVE NEGATIVE Final   C Diff interpretation Negative for C. difficile  Final  Gastrointestinal Panel by PCR , Stool     Status: None   Collection Time: 02/01/16  7:18 PM  Result Value Ref Range Status   Campylobacter species NOT DETECTED NOT DETECTED Final   Plesimonas shigelloides NOT DETECTED NOT DETECTED Final   Salmonella species NOT DETECTED NOT DETECTED Final   Yersinia enterocolitica NOT DETECTED NOT DETECTED Final   Vibrio species NOT DETECTED NOT DETECTED Final   Vibrio cholerae NOT DETECTED NOT DETECTED Final   Enteroaggregative E coli (EAEC) NOT DETECTED NOT DETECTED Final   Enteropathogenic E coli (EPEC) NOT DETECTED NOT DETECTED Final   Enterotoxigenic E coli (ETEC) NOT DETECTED NOT DETECTED Final   Shiga like toxin producing E coli (STEC) NOT DETECTED NOT DETECTED Final   E. coli O157 NOT DETECTED NOT DETECTED Final   Shigella/Enteroinvasive E coli (EIEC) NOT DETECTED NOT DETECTED Final   Cryptosporidium NOT DETECTED NOT DETECTED Final   Cyclospora cayetanensis NOT DETECTED NOT DETECTED Final   Entamoeba histolytica NOT DETECTED NOT DETECTED Final   Giardia lamblia NOT DETECTED NOT DETECTED Final   Adenovirus F40/41 NOT DETECTED NOT DETECTED  Final   Astrovirus NOT DETECTED NOT DETECTED Final   Norovirus GI/GII NOT DETECTED NOT DETECTED Final   Rotavirus A NOT DETECTED NOT DETECTED Final   Sapovirus (I, II, IV, and V) NOT DETECTED NOT DETECTED Final  CULTURE, BLOOD (ROUTINE X 2) w Reflex to ID Panel     Status: None (Preliminary result)   Collection Time: 02/03/16  4:45 AM  Result Value Ref Range Status   Specimen Description BLOOD LEFT HAND  Final   Special Requests BOTTLES DRAWN AEROBIC AND ANAEROBIC 10ML  Final   Culture NO GROWTH < 12 HOURS  Final   Report Status PENDING  Incomplete  CULTURE, BLOOD (ROUTINE X 2) w Reflex to ID Panel     Status: None (Preliminary result)   Collection Time: 02/03/16  4:50 AM  Result Value Ref Range Status   Specimen Description BLOOD RIGHT HAND  Final   Special Requests BOTTLES DRAWN AEROBIC AND ANAEROBIC 10ML  Final   Culture NO GROWTH < 12 HOURS  Final   Report Status PENDING  Incomplete    RADIOLOGY:  Ct Abdomen Pelvis W Contrast  02/01/2016  CLINICAL DATA:  82 year old male with urinary frequency. Loss of appetite. Initial encounter. EXAM: CT ABDOMEN AND PELVIS WITH CONTRAST TECHNIQUE: Multidetector CT imaging of the abdomen and pelvis was performed using the standard protocol following bolus administration of intravenous contrast. CONTRAST:  138mL ISOVUE-300 IOPAMIDOL (ISOVUE-300) INJECTION 61% COMPARISON:  no comparison CT.  Comparison sonogram 05/01/2014. FINDINGS: Lower chest: Scarring lung bases without worrisome abnormality. Coronary artery calcifications. Heart size within normal limits. Hepatobiliary: Negative. Pancreas: Negative. Spleen: Negative. Adrenals/Urinary Tract: Bilateral parapelvic cyst. Scarring kidneys bilaterally. Nonobstructing right lower pole 7 mm calcification. Mild thickening bladder wall diffusely without focal mass identified. Impression upon  the bladder base by slightly lobulated prostate gland. Hyperplasia adrenal glands. Stomach/Bowel: Right inguinal small  bowel containing hernia without obstruction. Sigmoid diverticulosis. No extra luminal bowel inflammatory process, free fluid or free air. Under distended stomach without gross abnormality. Vascular/Lymphatic: Prominent atherosclerotic changes of the abdominal aorta with areas of ectasia and mild narrowing. Mild narrowing common iliac arteries. Marked narrowing of femoral arteries. Moderate narrowing origin of the celiac artery and superior mesenteric artery. Mild to moderate narrowing origin inferior mesenteric artery. Reproductive: Enlarged lobulated prostate gland impressing upon bladder base. Musculoskeletal: Ankylosis lower thoracic and upper lumbar spine. Degenerative changes L2-3 through L5-S1 Bilateral hip joint degenerative changes. IMPRESSION: Small bowel containing right inguinal hernia without obstruction. Prominent sigmoid diverticulosis without evidence of extra luminal bowel inflammatory process, free fluid or free air. Bilateral pelvic renal cysts without hydronephrosis. Scarring both kidneys. Nonobstructing right lower pole 7 mm calcification. Mildly thickened bladder wall with impression upon bladder base by lobulated calcified prostate gland. Prominent atherosclerotic changes aorta and aortic branch vessels as detailed above. Ankylosis lower thoracic - lumbar spine with degenerative changes L2-3 through L5-S1. Electronically Signed   By: Genia Del M.D.   On: 02/01/2016 17:33     Management plans discussed with the patient, family and they are in agreement.  CODE STATUS:     Code Status Orders        Start     Ordered   02/01/16 2000  Do not attempt resuscitation (DNR)   Continuous    Question Answer Comment  In the event of cardiac or respiratory ARREST Do not call a "code blue"   In the event of cardiac or respiratory ARREST Do not perform Intubation, CPR, defibrillation or ACLS   In the event of cardiac or respiratory ARREST Use medication by any route, position, wound care,  and other measures to relive pain and suffering. May use oxygen, suction and manual treatment of airway obstruction as needed for comfort.      02/01/16 1959    Code Status History    Date Active Date Inactive Code Status Order ID Comments User Context   05/13/2015  2:41 PM 02/01/2016  7:59 PM DNR [id number]  Crecencio Mc, MD Outpatient    Advance Directive Documentation        Most Recent Value   Type of Advance Directive  Healthcare Power of [place], Living will   Pre-existing out of facility DNR order (yellow form or pink MOST form)     "MOST" Form in Place?        TOTAL TIME TAKING CARE OF THIS PATIENT:40* minutes.    Buster Schueller M.D on 02/03/2016 at 2:22 PM  Between 7am to 6pm - Pager - [phone] After 6pm go to [email or web address] - password [email or web address] [facility]  Office  [phone]  CC: Primary care physician; Crecencio Mc, MD

## 2016-02-03 NOTE — Progress Notes (Signed)
Physical Therapy Treatment Patient Details Name: Derrick Sullivan MRN: [id number] DOB: 1924/05/31 Today's Date: 02/03/2016    History of Present Illness Pt admitted for UTI. Pt with history of dementia, CAD, and PVD.     PT Comments    Pt is making good progress towards goals with rw used for gait training. Pt needs cues for correct technique and demonstrates safety issues upon entering room as he tries to leave rw to side of bed. Encouraged to continue use of RW for stability. Pt with improved balance using rw and will be at a decreased fall risk. Pt with good gait speed during ambulation and no SOB symptoms noted. Pt very close to baseline level.   Follow Up Recommendations  Home health PT     Equipment Recommendations  Rolling walker with 5" wheels    Recommendations for Other Services       Precautions / Restrictions Precautions Precautions: Fall Restrictions Weight Bearing Restrictions: No    Mobility  Bed Mobility Overal bed mobility: Needs Assistance Bed Mobility: Supine to Sit     Supine to sit: Min guard     General bed mobility comments: Pt needs cues to reach for bed rail in order to perform transfer. Once seated at EOB, pt able to sit with independence  Transfers Overall transfer level: Needs assistance Equipment used: Rolling walker (2 wheeled) Transfers: Sit to/from Stand Sit to Stand: Min guard         General transfer comment: Pt needs cues for sequencing and correct technique. Once standing, pt able to demonstrate upright posture.Cues given for correct use of AD.  Ambulation/Gait Ambulation/Gait assistance: Min guard Ambulation Distance (Feet): 300 Feet Assistive device: Rolling walker (2 wheeled) Gait Pattern/deviations: Step-through pattern     General Gait Details: ambulated using rw and reciprocal gait pattern. Pt able to carry conversation during ambulation, however momentarily stops and takes hands off rw. Needs reminders of how to use  AD correctly.   Stairs            Wheelchair Mobility    Modified Rankin (Stroke Patients Only)       Balance                                    Cognition Arousal/Alertness: Awake/alert Behavior During Therapy: WFL for tasks assessed/performed Overall Cognitive Status: Within Functional Limits for tasks assessed                      Exercises      General Comments        Pertinent Vitals/Pain Pain Assessment: No/denies pain    Home Living                      Prior Function            PT Goals (current goals can now be found in the care plan section) Acute Rehab PT Goals Patient Stated Goal: to go home PT Goal Formulation: With patient Time For Goal Achievement: 02/16/16 Potential to Achieve Goals: Good Progress towards PT goals: Progressing toward goals    Frequency  Min 2X/week    PT Plan Current plan remains appropriate    Co-evaluation             End of Session Equipment Utilized During Treatment: Gait belt Activity Tolerance: Patient tolerated treatment well Patient left: in chair;with chair alarm  set     Time: [id number] PT Time Calculation (min) (ACUTE ONLY): 24 min  Charges:  $Gait Training: 23-37 mins                    G Codes:      Elana Jian 01-Mar-2016, 11:51 AM  Greggory Stallion, PT, DPT [phone]

## 2016-02-03 NOTE — Care Management (Signed)
Discharge to home today with home health services n place per Dr. Fritzi Mandes. Physical therapy evaluation completed. Recommends home with home health and physical therapy. Spoke with a caregiver. States that Mr. Cury had [facility] last year and wasn't very pleased with the services. Discussed other home health agencies. [place]. Floydene Flock, representative for [facility] updated. Caregiver will transport. Shelbie Ammons RN MSN CCM Care Management [phone]

## 2016-02-03 NOTE — Plan of Care (Signed)
Problem: Education: Goal: Knowledge of treatment and prevention of UTI/Pyleonephritis will improve Outcome: Progressing Pt has dementia. Caregiver instructed.

## 2016-02-03 NOTE — Care Management Important Message (Signed)
Important Message  Patient Details  Name: Derrick Sullivan MRN: [id number] Date of Birth: 06/23/1924   Medicare Important Message Given:  Yes    Shelbie Ammons, RN 02/03/2016, 11:37 AM

## 2016-02-03 NOTE — Progress Notes (Signed)
Discussed discharge instructions and medications with pt, his wife, and private caregiver.  No questions at this time.  IV removed.  Pt transported home via car by wife and private caregiver. Clarise Cruz, RN

## 2016-02-03 NOTE — Discharge Instructions (Signed)
HHPT °

## 2016-02-03 NOTE — Telephone Encounter (Signed)
Will follow as appropriate. 

## 2016-02-04 ENCOUNTER — Telehealth: Payer: Self-pay | Admitting: Internal Medicine

## 2016-02-04 ENCOUNTER — Telehealth: Payer: Self-pay

## 2016-02-04 LAB — URINE CULTURE

## 2016-02-04 NOTE — Telephone Encounter (Signed)
Transitional care management complete.  Appointment scheduled.

## 2016-02-04 NOTE — Telephone Encounter (Signed)
Derrick Sullivan [id number] called from [facility] regarding pt going into the hospital for UTI for two days with IV and antibiotic. Pt was [name] hospital pt is discharged yesterday. Pt is already scheduled for HFU. Pt had CT scan and lab work done. Thank you!

## 2016-02-04 NOTE — Telephone Encounter (Signed)
Transition Care Management Follow-up Telephone Call   Date discharged? 02/03/16  Information received by care giver Hasseena (CC).  HIPPA compliant, with permission.   How have you been since you were released from the hospital? Still a little confused.  Monitoring to ensure voiding is measuring equal to intake of fluids.  No pain. Taking medications as prescribed.  Drinking plenty of fluids.  Appetite Ok.   Do you understand why you were in the hospital? YES,  I took him because he wasn't responding properly.  Discovered UTI.   Do you understand the discharge instructions? YES, drink plenty of fluids.  Continue to drink cranberry juice.  [facility] to assess.  Take medications as prescribed.  Where were you discharged to? HOME.   Items Reviewed:  Medications reviewed: YES, STARTED taking KEFLEX 500mg  and ENSURE ENLIVE LIQD without issues.  STOPPED taking CIPRO 250mg .  CONTINUING all other medications which have not changed.  Allergies reviewed: YES, Mirtazapine.  Dietary changes reviewed: YES, regular diet, no problems.  Referrals reviewed: YES, appointment scheduled with PCP.   Functional Questionnaire:   Activities of Daily Living (ADLs):   He states they are independent in the following: Bathing, dressing, toileting, self feeding. States they require assistance with the following: Ambulating (Cane in use), meal prep.  Caregiver and family assists.   Any transportation issues/concerns?: NO, caregiver to bring.   Any patient concerns? NO, not at this time.   Confirmed importance and date/time of follow-up visits scheduled YES, appointment scheduled 02/10/16 at 4:30.  Provider Appointment booked with Dr. Derrel Nip (PCP).  Confirmed with patient if condition begins to worsen call PCP or go to the ER.  Patient was given the office number and encouraged to call back with question or concerns.  : YES, verbalized understanding.

## 2016-02-04 DEATH — deceased

## 2016-02-06 LAB — CULTURE, BLOOD (ROUTINE X 2): CULTURE: NO GROWTH

## 2016-02-08 LAB — CULTURE, BLOOD (ROUTINE X 2)
CULTURE: NO GROWTH
Culture: NO GROWTH

## 2016-02-11 ENCOUNTER — Encounter: Payer: Self-pay | Admitting: Family Medicine

## 2016-02-11 ENCOUNTER — Ambulatory Visit (INDEPENDENT_AMBULATORY_CARE_PROVIDER_SITE_OTHER): Payer: Medicare Other | Admitting: Family Medicine

## 2016-02-11 VITALS — BP 118/68 | HR 72 | Temp 97.9°F | Wt 160.8 lb

## 2016-02-11 DIAGNOSIS — G308 Other Alzheimer's disease: Secondary | ICD-10-CM

## 2016-02-11 DIAGNOSIS — N390 Urinary tract infection, site not specified: Secondary | ICD-10-CM

## 2016-02-11 DIAGNOSIS — I10 Essential (primary) hypertension: Secondary | ICD-10-CM | POA: Diagnosis not present

## 2016-02-11 DIAGNOSIS — Z5189 Encounter for other specified aftercare: Secondary | ICD-10-CM

## 2016-02-11 NOTE — Patient Instructions (Signed)
Continue his current medications.  Continue to hold his blood pressure medication.  If his blood pressure is consistently above 150/90 would re-start.  Follow up closely with Dr. Derrel Nip.  Take care  Dr. Lacinda Axon

## 2016-02-12 NOTE — Assessment & Plan Note (Signed)
Patient doing well at this time and has finished antibiotic course. I reviewed his medications with the caregiver. No changes at this time. Will continue to hold his home blood pressure medications (unless BP are consistently >150/90).

## 2016-02-12 NOTE — Progress Notes (Signed)
Subjective:  Patient ID: Derrick Sullivan, male    DOB: Jun 23, 1924  Age: 82 y.o. MRN: [id number]  CC: Hospital follow up  HPI:  82 year old Male with Alzheimer's dementia, BPH, HTN presents for hospital follow up.  Patient was recently admitted from 5/20 925/31. His hospital course and discharge summary were reviewed and are summarized as follows:  Patient presented to the ED with complaints of lower abdominal pain, nausea, vomiting, and diarrhea. He also has complaints of urinary frequency. He was subsequent found to have a urinary tract infection and also had elevated troponin. He was admitted to the hospitalist service. Patient was treated with IV and antibiotics and was transitioned to PO Keflex prior to discharge. Elevated troponin was attributed to demand ischemia in the setting of UTI. Per the hospital report this remained stable and no further intervention was done.  Patient presents today for follow-up. He is accompanied by his wife and his home health caregiver as well. Caregivers primary historian as he has significant dementia and his wife is not well either.  Per the caregiver, he is doing well. Patient also states that he's doing well. He has completed the antibiotic course. He continues to have issues with incontinence. The caregiver informed me that this has been a ongoing problem even prior to the UTI and hospital admission. Medications were reviewed. His blood pressure medications recently been held due to low blood pressures. He continues to have low/low normal blood pressures at home without medication. No reports of behavior changes. Caregiver & patient both feel like things are going well.  Social Hx   Social History   Social History  . Marital Status: Married    Spouse Name: N/A  . Number of Children: N/A  . Years of Education: 16   Occupational History  .     Social History Main Topics  . Smoking status: Never Smoker   . Smokeless tobacco: Former Systems developer    Quit  date: 07/05/1956  . Alcohol Use: No  . Drug Use: No  . Sexual Activity: No   Other Topics Concern  . None   Social History Narrative   Review of Systems  Constitutional: Negative.   Gastrointestinal: Negative.   Genitourinary: Positive for urgency.       Urinary incontinence.   Psychiatric/Behavioral:       No changes in mood or behavior.   Objective:  BP 118/68 mmHg  Pulse 72  Temp(Src) 97.9 F (36.6 C)  Wt 160 lb 12 oz (72.916 kg)  SpO2 98%  BP/Weight 02/11/2016 02/03/2016 [id number]  Systolic BP 123456 0000000 -  Diastolic BP 68 50 -  Wt. (Lbs) 160.75 - 160.8  BMI 23.07 - 23.07   Physical Exam  Constitutional: He appears well-developed. No distress.  Cardiovascular: Normal rate and regular rhythm.   Pulmonary/Chest: Effort normal. He has no wheezes. He has no rales.  Abdominal: Soft. He exhibits no distension. There is no tenderness. There is no rebound and no guarding.  Neurological: He is alert.  Psychiatric: He has a normal mood and affect.  Vitals reviewed.  Lab Results  Component Value Date   WBC 7.5 02/03/2016   HGB 10.7* 02/03/2016   HCT 30.9* 02/03/2016   PLT 138* 02/03/2016   GLUCOSE 178* 02/01/2016   CHOL 165 11/06/2013   TRIG 54.0 11/06/2013   HDL 35.40* 11/06/2013   LDLDIRECT 63.0 08/12/2015   LDLCALC 119* 11/06/2013   ALT 22 02/01/2016   AST 23 02/01/2016   NA  134* 02/01/2016   K 4.4 02/01/2016   CL 101 02/01/2016   CREATININE 1.18 02/01/2016   BUN 33* 02/01/2016   CO2 24 02/01/2016   TSH 1.39 08/12/2015   HGBA1C 6.2 07/28/2014   MICROALBUR 63.4* 12/22/2014   Assessment & Plan:   Problem List Items Addressed This Visit    UTI (lower urinary tract infection) - Primary    Patient doing well at this time and has finished antibiotic course. I reviewed his medications with the caregiver. No changes at this time. Will continue to hold his home blood pressure medications (unless BP are consistently >150/90).         Follow-up: PRN  Butler

## 2016-02-26 ENCOUNTER — Telehealth: Payer: Self-pay | Admitting: Internal Medicine

## 2016-02-26 NOTE — Telephone Encounter (Signed)
Strong odor is not a sign of UTI.  If he just finished anti biotics and has not symptoms of frequency or dysuria  .Marland Kitchen Does not need UA

## 2016-02-26 NOTE — Telephone Encounter (Signed)
Patients caregiver was informed

## 2016-02-26 NOTE — Telephone Encounter (Signed)
Can this be done [facility]?

## 2016-02-26 NOTE — Telephone Encounter (Signed)
CC pt's care giver called. Pt's urine is smelling strong and was wondering if she could bring in a urine sample to be checked. Please call her at [phone].

## 2016-02-26 NOTE — Telephone Encounter (Signed)
Need more info

## 2016-02-26 NOTE — Telephone Encounter (Signed)
Patients caregiver stated that her nurse wanted to have an order put in and bring a urine sample, due to patient having a strong odor in urine.  Patient has recently finished antibiotics. Please advise

## 2016-03-01 ENCOUNTER — Ambulatory Visit (INDEPENDENT_AMBULATORY_CARE_PROVIDER_SITE_OTHER): Payer: Medicare Other | Admitting: Family Medicine

## 2016-03-01 VITALS — BP 108/64 | HR 79 | Temp 98.3°F | Wt 158.0 lb

## 2016-03-01 DIAGNOSIS — R350 Frequency of micturition: Secondary | ICD-10-CM

## 2016-03-01 LAB — POCT URINALYSIS DIPSTICK
BILIRUBIN UA: NEGATIVE
GLUCOSE UA: NEGATIVE
Ketones, UA: NEGATIVE
Nitrite, UA: POSITIVE
SPEC GRAV UA: 1.025
Urobilinogen, UA: 0.2
pH, UA: 6

## 2016-03-01 MED ORDER — MIRABEGRON ER 25 MG PO TB24: 25.0000 mg | ORAL_TABLET | Freq: Every day | ORAL | Status: DC

## 2016-03-01 NOTE — Progress Notes (Signed)
Subjective:  Patient ID: Derrick Sullivan, male    DOB: [birth year]  Age: 82 y.o. MRN: [id number]  CC: concern for UTI  HPI:  82 year old male with a complicated past medical history including BPH, DM-2, and Alzheimer's disease presents with complaints of urinary frequency/urgency and incontinence.   Caregiver is the primary historian secondary to patient's dementia. Caregiver states that he's recently complained of urinary frequency. Has frequent incontinence as well. She also notes that he's had foul-smelling urine. No associated fevers or chills. No changes in behavior. No reports of abdominal pain. No other associated symptoms. Patient notes that he gets up frequently to urinate and often cannot make it to the restroom. No reports of dysuria. No other complaints at this time.  Social Hx   Social History   Social History  . Marital Status: Married    Spouse Name: N/A  . Number of Children: N/A  . Years of Education: 16   Occupational History  .     Social History Main Topics  . Smoking status: Never Smoker   . Smokeless tobacco: Former Systems developer    Quit date: 07/05/1956  . Alcohol Use: No  . Drug Use: No  . Sexual Activity: No   Other Topics Concern  . Not on file   Social History Narrative   Review of Systems  Constitutional: Negative.   Gastrointestinal: Negative.   Genitourinary: Positive for urgency and frequency.   Objective:  BP 108/64 mmHg  Pulse 79  Temp(Src) 98.3 F (36.8 C) (Oral)  Wt 158 lb (71.668 kg)  SpO2 97%  BP/Weight 03/01/2016 02/11/2016 0000000  Systolic BP 123XX123 123456 0000000  Diastolic BP 64 68 50  Wt. (Lbs) 158 160.75 -  BMI 22.67 23.07 -   Physical Exam  Constitutional: He appears well-developed. No distress.  Pulmonary/Chest: Effort normal.  Abdominal: He exhibits no distension.  Neurological: He is alert.  Psychiatric: He has a normal mood and affect.  Vitals reviewed.  Lab Results  Component Value Date   WBC 7.5 02/03/2016   HGB 10.7*  02/03/2016   HCT 30.9* 02/03/2016   PLT 138* 02/03/2016   GLUCOSE 178* 02/01/2016   CHOL 165 11/06/2013   TRIG 54.0 11/06/2013   HDL 35.40* 11/06/2013   LDLDIRECT 63.0 08/12/2015   LDLCALC 119* 11/06/2013   ALT 22 02/01/2016   AST 23 02/01/2016   NA 134* 02/01/2016   K 4.4 02/01/2016   CL 101 02/01/2016   CREATININE 1.18 02/01/2016   BUN 33* 02/01/2016   CO2 24 02/01/2016   TSH 1.39 08/12/2015   HGBA1C 6.2 07/28/2014   MICROALBUR 63.4* 12/22/2014   Results for orders placed or performed in visit on 03/01/16 (from the past 24 hour(s))  POCT Urinalysis Dipstick     Status: Abnormal   Collection Time: 03/01/16 10:36 AM  Result Value Ref Range   Color, UA yellow    Clarity, UA cloudy    Glucose, UA neg    Bilirubin, UA neg    Ketones, UA neg    Spec Grav, UA 1.025    Blood, UA 1+    pH, UA 6.0    Protein, UA 1+    Urobilinogen, UA 0.2    Nitrite, UA pos    Leukocytes, UA large (3+) (A) Negative   Assessment & Plan:   Problem List Items Addressed This Visit    Urinary frequency - Primary    Established problem, worsening. Patient has frequent incontinence as well as urgency and frequency.  He has no history of BPH. Urinalysis abnormal today. Sending for culture. Patient has difficulty with this at baseline. Starting Myrbetriq.      Relevant Orders   POCT Urinalysis Dipstick (Completed)   Urine Culture     Meds ordered this encounter  Medications  . mirabegron ER (MYRBETRIQ) 25 MG TB24 tablet    Sig: Take 1 tablet (25 mg total) by mouth daily.    Dispense:  90 tablet    Refill:  0   Follow-up: PRN  Rosalia

## 2016-03-01 NOTE — Progress Notes (Signed)
Pre visit review using our clinic review tool, if applicable. No additional management support is needed unless otherwise documented below in the visit note. 

## 2016-03-01 NOTE — Patient Instructions (Signed)
Take the Myrbetriq as prescribed.  Follow up closely with Dr. Derrel Nip  Take care  Dr. Lacinda Axon

## 2016-03-01 NOTE — Assessment & Plan Note (Signed)
Established problem, worsening. Patient has frequent incontinence as well as urgency and frequency. He has no history of BPH. Urinalysis abnormal today. Sending for culture. Patient has difficulty with this at baseline. Starting Myrbetriq.

## 2016-03-03 ENCOUNTER — Other Ambulatory Visit: Payer: Self-pay | Admitting: Family Medicine

## 2016-03-03 LAB — URINE CULTURE: Colony Count: >=100000

## 2016-03-03 MED ORDER — AMOXICILLIN-POT CLAVULANATE 500-125 MG PO TABS: 1.0000 | ORAL_TABLET | Freq: Two times a day (BID) | ORAL | Status: DC

## 2016-03-17 ENCOUNTER — Other Ambulatory Visit: Payer: Self-pay | Admitting: Internal Medicine

## 2016-03-21 ENCOUNTER — Telehealth: Payer: Self-pay | Admitting: Internal Medicine

## 2016-03-21 NOTE — Telephone Encounter (Signed)
Derrick Sullivan [id number] called from [facility] regarding pt wife fell yesterday morning and he tried to pick her up and fell and then he hit is head. Derrick Sullivan is encouraging that he has skilled 24/7 care or assisted living as well. Thank you!

## 2016-03-21 NOTE — Telephone Encounter (Signed)
Spoke with Derrick Sullivan, per the caregiver, the patient got up to assist his wife in the bathroom and he fell, he has a injury above the eye on his forehead, it is not broken open.  He didn't admit to falling until the EMT's started asking questions.  Per Derrick Sullivan, he is not complaining of any headaches or soreness.  She believes that he needs a higher level of care at this point.  (24 hour care or skill nursing) Thanks

## 2016-04-15 ENCOUNTER — Other Ambulatory Visit (INDEPENDENT_AMBULATORY_CARE_PROVIDER_SITE_OTHER): Payer: Medicare Other

## 2016-04-15 ENCOUNTER — Telehealth: Payer: Self-pay | Admitting: Internal Medicine

## 2016-04-15 DIAGNOSIS — M5441 Lumbago with sciatica, right side: Secondary | ICD-10-CM

## 2016-04-15 LAB — POCT URINALYSIS DIPSTICK
Bilirubin, UA: NEGATIVE
Glucose, UA: NEGATIVE
Nitrite, UA: POSITIVE
PROTEIN UA: 100
Spec Grav, UA: 1.02
UROBILINOGEN UA: 0.2
pH, UA: 6.5

## 2016-04-15 LAB — URINALYSIS, ROUTINE W REFLEX MICROSCOPIC
BILIRUBIN URINE: NEGATIVE
Ketones, ur: NEGATIVE
NITRITE: NEGATIVE
Specific Gravity, Urine: 1.01 (ref 1.000–1.030)
TOTAL PROTEIN, URINE-UPE24: 30 — AB
URINE GLUCOSE: NEGATIVE
UROBILINOGEN UA: 0.2 (ref 0.0–1.0)
pH: 6 (ref 5.0–8.0)

## 2016-04-15 NOTE — Telephone Encounter (Signed)
Pt caregiver called about pt having strong urine and back pain. Can pt have a dip done today? Pt wife is coming in to see Dr Lacinda Axon today. Please advise?  Call caregiver @ [phone]. Thank you!

## 2016-04-15 NOTE — Telephone Encounter (Signed)
Pt caregiver called about pt having strong urine and back pain. Patient was having SX when he was DX with UTI.Can pt have a dip done today? Pt wife is coming in to see Dr Lacinda Axon today@ 1330. Please advise.

## 2016-04-15 NOTE — Telephone Encounter (Signed)
Urine ordered due to Telephone note. Per Dr. Derrel Nip

## 2016-04-15 NOTE — Telephone Encounter (Signed)
Yes he can submit a urine . It has been ordered

## 2016-04-17 MED ORDER — CIPROFLOXACIN HCL 250 MG PO TABS: 250.0000 mg | ORAL_TABLET | Freq: Two times a day (BID) | ORAL | 0 refills | Status: DC

## 2016-04-17 NOTE — Addendum Note (Signed)
Addended by: Crecencio Mc on: 04/17/2016 10:07 PM   Modules accepted: Orders

## 2016-04-18 LAB — URINE CULTURE: Colony Count: >=100000

## 2016-05-11 ENCOUNTER — Ambulatory Visit (INDEPENDENT_AMBULATORY_CARE_PROVIDER_SITE_OTHER): Payer: Medicare Other

## 2016-05-11 VITALS — BP 124/66 | HR 77 | Temp 98.3°F | Resp 14 | Ht 67.0 in | Wt 166.8 lb

## 2016-05-11 DIAGNOSIS — Z0000 Encounter for general adult medical examination without abnormal findings: Secondary | ICD-10-CM

## 2016-05-11 DIAGNOSIS — Z23 Encounter for immunization: Secondary | ICD-10-CM

## 2016-05-11 NOTE — Patient Instructions (Addendum)
Mr. Derrick Sullivan , Thank you for taking time to come for your Medicare Wellness Visit. I appreciate your ongoing commitment to your health goals. Please review the following plan we discussed and let me know if I can assist you in the future.   FOLLOW UP WITH DR. Derrel Nip AS NEEDED.  These are the goals we discussed: Goals    . Increase physical activity          Chair exercises as demonstrated.  Educational material provided.    . Increase water intake          Patient centered goal is to increase water intake.  Currently drinks 2 glasses daily.  Increase by 1 glass daily.       This is a list of the screening recommended for you and due dates:  Health Maintenance  Topic Date Due  . Tetanus Vaccine  06/08/1943  . Eye exam for diabetics  05/14/2014  . Hemoglobin A1C  01/26/2015  . Complete foot exam   07/29/2015  . Urine Protein Check  12/22/2015  . Flu Shot  Completed  . Shingles Vaccine  Completed  . Pneumonia vaccines  Completed    Fall Prevention in the Home  Falls can cause injuries. They can happen to people of all ages. There are many things you can do to make your home safe and to help prevent falls.  WHAT CAN I DO ON THE OUTSIDE OF MY HOME?  Regularly fix the edges of walkways and driveways and fix any cracks.  Remove anything that might make you trip as you walk through a door, such as a raised step or threshold.  Trim any bushes or trees on the path to your home.  Use bright outdoor lighting.  Clear any walking paths of anything that might make someone trip, such as rocks or tools.  Regularly check to see if handrails are loose or broken. Make sure that both sides of any steps have handrails.  Any raised decks and porches should have guardrails on the edges.  Have any leaves, snow, or ice cleared regularly.  Use sand or salt on walking paths during winter.  Clean up any spills in your garage right away. This includes oil or grease spills. WHAT CAN I DO IN THE  BATHROOM?   Use night lights.  Install grab bars by the toilet and in the tub and shower. Do not use towel bars as grab bars.  Use non-skid mats or decals in the tub or shower.  If you need to sit down in the shower, use a plastic, non-slip stool.  Keep the floor dry. Clean up any water that spills on the floor as soon as it happens.  Remove soap buildup in the tub or shower regularly.  Attach bath mats securely with double-sided non-slip rug tape.  Do not have throw rugs and other things on the floor that can make you trip. WHAT CAN I DO IN THE BEDROOM?  Use night lights.  Make sure that you have a light by your bed that is easy to reach.  Do not use any sheets or blankets that are too big for your bed. They should not hang down onto the floor.  Have a firm chair that has side arms. You can use this for support while you get dressed.  Do not have throw rugs and other things on the floor that can make you trip. WHAT CAN I DO IN THE KITCHEN?  Clean up any spills right away.  Avoid walking on wet floors.  Keep items that you use a lot in easy-to-reach places.  If you need to reach something above you, use a strong step stool that has a grab bar.  Keep electrical cords out of the way.  Do not use floor polish or wax that makes floors slippery. If you must use wax, use non-skid floor wax.  Do not have throw rugs and other things on the floor that can make you trip. WHAT CAN I DO WITH MY STAIRS?  Do not leave any items on the stairs.  Make sure that there are handrails on both sides of the stairs and use them. Fix handrails that are broken or loose. Make sure that handrails are as long as the stairways.  Check any carpeting to make sure that it is firmly attached to the stairs. Fix any carpet that is loose or worn.  Avoid having throw rugs at the top or bottom of the stairs. If you do have throw rugs, attach them to the floor with carpet tape.  Make sure that you have  a light switch at the top of the stairs and the bottom of the stairs. If you do not have them, ask someone to add them for you. WHAT ELSE CAN I DO TO HELP PREVENT FALLS?  Wear shoes that:  Do not have high heels.  Have rubber bottoms.  Are comfortable and fit you well.  Are closed at the toe. Do not wear sandals.  If you use a stepladder:  Make sure that it is fully opened. Do not climb a closed stepladder.  Make sure that both sides of the stepladder are locked into place.  Ask someone to hold it for you, if possible.  Clearly mark and make sure that you can see:  Any grab bars or handrails.  First and last steps.  Where the edge of each step is.  Use tools that help you move around (mobility aids) if they are needed. These include:  Canes.  Walkers.  Scooters.  Crutches.  Turn on the lights when you go into a dark area. Replace any light bulbs as soon as they burn out.  Set up your furniture so you have a clear path. Avoid moving your furniture around.  If any of your floors are uneven, fix them.  If there are any pets around you, be aware of where they are.  Review your medicines with your doctor. Some medicines can make you feel dizzy. This can increase your chance of falling. Ask your doctor what other things that you can do to help prevent falls.   This information is not intended to replace advice given to you by your health care provider. Make sure you discuss any questions you have with your health care provider.   Document Released: 06/18/2009 Document Revised: 01/06/2015 Document Reviewed: 09/26/2014 Elsevier Interactive Patient Education [employer].

## 2016-05-11 NOTE — Progress Notes (Addendum)
Subjective:   Derrick Sullivan is a 82 y.o. male who presents for Medicare Annual/Subsequent preventive examination.  Review of Systems:  No ROS.  Medicare Wellness Visit.  Cardiac Risk Factors include: advanced age (>41men, >69 women);hypertension;diabetes mellitus;male gender     Objective:    Vitals: BP 124/66 (BP Location: Left Arm, Patient Position: Sitting, Cuff Size: Normal)   Pulse 77   Temp 98.3 F (36.8 C) (Oral)   Resp 14   Ht 5\' 7"  (1.702 m)   Wt 166 lb 12.8 oz (75.7 kg)   SpO2 97%   BMI 26.12 kg/m   Body mass index is 26.12 kg/m.  Tobacco History  Smoking Status  . Never Smoker  Smokeless Tobacco  . Former Systems developer  . Quit date: 07/05/1956     Counseling given: Not Answered   Past Medical History:  Diagnosis Date  . Benign meningioma ([place]) 2006   s/p parietal resection  . Benign prostatic hypertrophy   . CAD (coronary artery disease)   . Carotid artery stenosis, asymptomatic 2008   hemodynamically insignificant  . Degenerative disk disease   . Dementia   . Diverticulosis of colon 2007  . Duodenal ulcer 07/2004   NSAID induced  . Hyperlipidemia   . Hypertension 09/26/2011  . Osteopenia   . Osteoporosis   . Peripheral vascular disease ([place])   . Presbyacusis    Past Surgical History:  Procedure Laterality Date  . BRAIN SURGERY     meningioma resection  . GALLBLADDER SURGERY  2000  . SHOULDER SURGERY  2000   rotator cuff   Family History  Problem Relation Age of Onset  . Diabetes Mother   . Heart disease Father    History  Sexual Activity  . Sexual activity: No    Outpatient Encounter Prescriptions as of 05/11/2016  Medication Sig  . acetaminophen (TYLENOL) 500 MG tablet Take 1,000 mg by mouth every 6 (six) hours as needed for mild pain, fever or headache.   Marland Kitchen acidophilus (RISAQUAD) CAPS capsule Take 1 capsule by mouth at bedtime. Reported on 02/11/2016  . aspirin EC 81 MG tablet Take 81 mg by mouth daily. Reported on 02/11/2016  .  atenolol (TENORMIN) 25 MG tablet Take 0.5 tablets (12.5 mg total) by mouth 2 (two) times daily.  Marland Kitchen atorvastatin (LIPITOR) 10 MG tablet Take 10 mg by mouth at bedtime.  . calcium carbonate (OSCAL) 1500 (600 Ca) MG TABS tablet Take 600 mg of elemental calcium by mouth 2 (two) times daily with a meal.  . ciprofloxacin (CIPRO) 250 MG tablet Take 1 tablet (250 mg total) by mouth 2 (two) times daily.  . citalopram (CELEXA) 20 MG tablet Take 20 mg by mouth daily. Reported on 02/11/2016  . citalopram (CELEXA) 20 MG tablet TAKE ONE TABLET BY MOUTH EVERY DAY  . diphenhydrAMINE (BENADRYL) 25 mg capsule Take 25 mg by mouth every 8 (eight) hours as needed for itching or allergies. Reported on 02/11/2016  . divalproex (DEPAKOTE) 125 MG DR tablet Take 125 mg by mouth 2 (two) times daily.   Marland Kitchen docusate sodium (COLACE) 100 MG capsule Take 100 mg by mouth 2 (two) times daily.  Marland Kitchen donepezil (ARICEPT) 10 MG tablet Take 10 mg by mouth at bedtime.  . feeding supplement, ENSURE ENLIVE, (ENSURE ENLIVE) LIQD Take 237 mLs by mouth 2 (two) times daily between meals.  . fluocinonide (LIDEX) 0.05 % cream Apply 1 application topically as needed (for irritation). Reported on 02/11/2016  . furosemide (LASIX) 20 MG  tablet Take 20 mg by mouth daily as needed for edema. Reported on 02/11/2016  . Lactobacillus-Inulin (Arboles) CAPS Take 1 capsule by mouth 4 (four) times daily.  . magnesium oxide (MAG-OX) 400 MG tablet Take 400 mg by mouth as needed (Constipation).  . memantine (NAMENDA) 5 MG tablet Take 5 mg by mouth 2 (two) times daily.  . mirabegron ER (MYRBETRIQ) 25 MG TB24 tablet Take 1 tablet (25 mg total) by mouth daily.  . Multiple Vitamins-Minerals (PRESERVISION AREDS 2) CAPS Take 1 capsule by mouth daily.  . polyethylene glycol (MIRALAX / GLYCOLAX) packet Take 17 g by mouth daily as needed for mild constipation. Reported on 02/11/2016  . [DISCONTINUED] amoxicillin-clavulanate (AUGMENTIN) 500-125 MG tablet Take 1  tablet (500 mg total) by mouth 2 (two) times daily.   No facility-administered encounter medications on file as of 05/11/2016.     Activities of Daily Living In your present state of health, do you have any difficulty performing the following activities: 05/11/2016 02/01/2016  Hearing? Tempie Donning  Vision? N N  Difficulty concentrating or making decisions? Tempie Donning  Walking or climbing stairs? Y Y  Dressing or bathing? N Y  Doing errands, shopping? Y N  Preparing Food and eating ? Y -  Using the Toilet? N -  In the past six months, have you accidently leaked urine? Y -  Do you have problems with loss of bowel control? Y -  Managing your Medications? Y -  Managing your Finances? Y -  Housekeeping or managing your Housekeeping? Y -  Some recent data might be hidden    Patient Care Team: Crecencio Mc, MD as PCP - General (Internal Medicine)   Assessment:    This is a routine wellness examination for [place]. The goal of the wellness visit is to assist the patient how to close the gaps in care and create a preventative care plan for the patient.   Taking calcium as appropriate/Osteoporosis reviewed.  Medications reviewed; taking without issues.  Caretaker assists.  Safety issues reviewed; smoke and carbon monoxide detectors in the home. Firearms locked in a safe area within in the home. Wears seatbelts when riding with others. No violence in the home.  No new risks were noted; The patient was oriented; appropriate in dress and manner.    Body mass index; normal. Discussed the importance of a healthy diet, water intake and exercise. Some dietary indiscretions.  Educational material provided.   High dose influenza vaccine administered, L deltoid.  Tolerated well.  Dentist; Dr. Lyman Bishop. Last OV 04/2016.   Ophthalmology; Dr. Jeni Salles.  No retinopathy recorded. Last oV 04/2015.  Plans to schedule exam soon.   Patient Concerns: None at this time. Follow up with PCP as  needed.  Exercise Activities and Dietary recommendations Current Exercise Habits: The patient does not participate in regular exercise at present  Goals    . Increase physical activity          Chair exercises as demonstrated.  Educational material provided.    . Increase water intake          Patient centered goal is to increase water intake.  Currently drinks 2 glasses daily.  Increase by 1 glass daily.      Fall Risk Fall Risk  05/11/2016 05/04/2015 04/29/2014 04/28/2014 10/02/2012  Falls in the past year? No Yes Yes Yes No  Number falls in past yr: - 1 1 1  -  Injury with Fall? - No No - -  Risk for fall due to : - - Impaired balance/gait - History of fall(s)  Follow up - Falls prevention discussed;Education provided - - -   Depression Screen PHQ 2/9 Scores 05/11/2016 05/04/2015 04/29/2014 04/28/2014  PHQ - 2 Score 1 0 2 0  PHQ- 9 Score 2 - 3 -    Cognitive Testing MMSE - Mini Mental State Exam 05/11/2016 05/04/2015  Not completed: Unable to complete;Refused -  Orientation to time - 5  Orientation to Place - 5  Registration - 3  Attention/ Calculation - 5  Recall - 2  Language- name 2 objects - 2  Language- repeat - 1  Language- follow 3 step command - 3  Language- read & follow direction - 1  Write a sentence - 1  Copy design - 1  Total score - 29    Immunization History  Administered Date(s) Administered  . Influenza Split 07/04/2013  . Influenza, High Dose Seasonal PF 08/12/2015, 05/11/2016  . Influenza,inj,Quad PF,36+ Mos 06/25/2014  . Pneumococcal Conjugate-13 07/28/2014  . Pneumococcal Polysaccharide-23 06/12/2013  . Zoster 08/01/2006   Screening Tests Health Maintenance  Topic Date Due  . TETANUS/TDAP  06/08/1943  . OPHTHALMOLOGY EXAM  05/14/2014  . HEMOGLOBIN A1C  01/26/2015  . FOOT EXAM  07/29/2015  . URINE MICROALBUMIN  12/22/2015  . INFLUENZA VACCINE  Completed  . ZOSTAVAX  Completed  . PNA vac Low Risk Adult  Completed      Plan:   End of life  planning; Advance aging; Advanced directives discussed. Copy of current HCPOA/Living Will requested.  Medicare Attestation I have personally reviewed: The patient's medical and social history Their use of alcohol, tobacco or illicit drugs Their current medications and supplements The patient's functional ability including ADLs,fall risks, home safety risks, cognitive, and hearing and visual impairment Diet and physical activities Evidence for depression   The patient's weight, height, BMI, and visual acuity have been recorded in the chart.  I have made referrals and provided education to the patient based on review of the above and I have provided the patient with a written personalized care plan for preventive services.    During the course of the visit the patient was educated and counseled about the following appropriate screening and preventive services:   Vaccines to include Pneumoccal, Influenza, Hepatitis B, Td, Zostavax, HCV  Electrocardiogram  Cardiovascular Disease  Colorectal cancer screening  Diabetes screening  Prostate Cancer Screening  Glaucoma screening  Nutrition counseling   Smoking cessation counseling  Patient Instructions (the written plan) was given to the patient.    OBrien-Blaney, Rokhaya Quinn L, LPN  [phone]   I have reviewed the above information and agree with above.   Deborra Medina, MD

## 2016-06-06 ENCOUNTER — Ambulatory Visit (INDEPENDENT_AMBULATORY_CARE_PROVIDER_SITE_OTHER): Payer: Medicare Other | Admitting: Vascular Surgery

## 2016-06-06 ENCOUNTER — Encounter (INDEPENDENT_AMBULATORY_CARE_PROVIDER_SITE_OTHER): Payer: Medicare Other

## 2016-06-06 ENCOUNTER — Other Ambulatory Visit: Payer: Self-pay | Admitting: Internal Medicine

## 2016-06-22 ENCOUNTER — Telehealth: Payer: Self-pay | Admitting: *Deleted

## 2016-06-22 ENCOUNTER — Telehealth: Payer: Self-pay | Admitting: Internal Medicine

## 2016-06-22 DIAGNOSIS — R5381 Other malaise: Secondary | ICD-10-CM

## 2016-06-22 DIAGNOSIS — R350 Frequency of micturition: Secondary | ICD-10-CM

## 2016-06-22 NOTE — Telephone Encounter (Signed)
Spoke with the patient.  He has little complaints of urinary issues. He did say that he gets up at night frequently to go to the bathroom, no burning noted.  He states it is no worse then it has been in the past months.  He has no other complaints.  Wife was in the background assisting with call and she didn't seem concerned with his urination or confusion.  Please advise, thanks

## 2016-06-22 NOTE — Telephone Encounter (Signed)
Authorize collection of urine for testing . Labs ordered

## 2016-06-22 NOTE — Telephone Encounter (Signed)
fyi

## 2016-06-22 NOTE — Telephone Encounter (Signed)
Nurse Assessment  Nurse: Julien Girt, RN, Almyra Free Date/Time Eilene Ghazi Time): 06/22/2016 11:31:01 AM  Confirm and document reason for call. If symptomatic, describe symptoms. You must click the next button to save text entered. ---Caller is Peter Congo, nurse supervisor for [facility]. States her CNA is reporting that Mr. Agyeman has extreme weakness, he had confusion this morning but is "is fine now", and he was very hard to get out of bed. Peter Congo states he has a hx of UTI and is asking if they can get a UA and bring it to the office. Maudie Flakes that I need to speak to his CNA, Davy Pique, to assess his sx. She will have Sonya call back.  Has the patient traveled out of the country within the last 30 days? ---Not Applicable  Does the patient have any new or worsening symptoms? ---Yes  Will a triage be completed? ---No  Select reason for no triage. ---Other  Please document clinical information provided and list any resource used. ---CNA Sonya to call back for triage.     Guidelines      Guideline Title Affirmed Question Affirmed Notes Nurse Date/Time (Eastern Time)         Disp. Time Eilene Ghazi Time) Disposition Final User   06/22/2016 11:29:46 AM Send to Urgent Queue  Donato Heinz   06/22/2016 11:57:32 AM Called On-Call Provider  Julien Girt, RN, Almyra Free    Reason: Called back line, spoke to [facility], information provided and she will call the patients home.       06/22/2016 11:57:39 AM Clinical Call Yes Julien Girt, RN, Almyra Free

## 2016-06-22 NOTE — Telephone Encounter (Signed)
[name] from [facility] reported pt having Symptom of  Confusion,bodyaches and generally  feels bad with extreme fatigue. Nurse stated that pt has a Hx of UTi.  *Pt transferred to Nurse Line for triage

## 2016-06-22 NOTE — Telephone Encounter (Signed)
Clarified with tanya that she has tried to call patient .

## 2016-06-22 NOTE — Telephone Encounter (Signed)
Spoke with Almyra [facility] at [facility].  Call was transferred to them and nurse supervisor with home health asked for a urinalysis and triage nurse attempted to get more information and supervisor was not willing to let her talk to the CNA at the home.  Almyra Free called the office to get advise.  I advised that patient has a history of UTI's in the past few months and was treated with antibiotics.  I offered to call the home to get details and see what we can do to assist the patient.  Attempted home number, no answer and not able to leave a VM, no VM on that line.  Will attempt again.  Thanks

## 2016-06-23 ENCOUNTER — Other Ambulatory Visit (INDEPENDENT_AMBULATORY_CARE_PROVIDER_SITE_OTHER): Payer: Medicare Other

## 2016-06-23 ENCOUNTER — Other Ambulatory Visit: Payer: Medicare Other

## 2016-06-23 ENCOUNTER — Ambulatory Visit: Payer: Medicare Other | Admitting: Family Medicine

## 2016-06-23 DIAGNOSIS — R350 Frequency of micturition: Secondary | ICD-10-CM

## 2016-06-23 DIAGNOSIS — R5381 Other malaise: Secondary | ICD-10-CM

## 2016-06-23 LAB — POCT URINALYSIS DIPSTICK
Bilirubin, UA: NEGATIVE
Blood, UA: NEGATIVE
Glucose, UA: NEGATIVE
Ketones, UA: NEGATIVE
LEUKOCYTES UA: NEGATIVE
NITRITE UA: NEGATIVE
PROTEIN UA: 30
Spec Grav, UA: 1.02
UROBILINOGEN UA: 0.2
pH, UA: 7

## 2016-06-23 NOTE — Telephone Encounter (Signed)
Patient coming in today to give specimen, thanks

## 2016-06-24 LAB — URINALYSIS, ROUTINE W REFLEX MICROSCOPIC
BILIRUBIN URINE: NEGATIVE
Hgb urine dipstick: NEGATIVE
KETONES UR: NEGATIVE
Leukocytes, UA: NEGATIVE
NITRITE: NEGATIVE
RBC / HPF: NONE SEEN (ref 0–?)
Specific Gravity, Urine: 1.01 (ref 1.000–1.030)
Urine Glucose: NEGATIVE
Urobilinogen, UA: 0.2 (ref 0.0–1.0)
pH: 7 (ref 5.0–8.0)

## 2016-06-25 LAB — URINE CULTURE: ORGANISM ID, BACTERIA: NO GROWTH

## 2016-06-28 ENCOUNTER — Encounter: Payer: Self-pay | Admitting: *Deleted

## 2016-07-25 ENCOUNTER — Other Ambulatory Visit: Payer: Self-pay | Admitting: Internal Medicine

## 2016-08-26 ENCOUNTER — Telehealth: Payer: Self-pay | Admitting: Internal Medicine

## 2016-08-26 NOTE — Telephone Encounter (Signed)
Crtney from [facility] called and is requesting a updated medication list for pt. Please advise, thank you!  Call @ [phone]  Fax [phone]

## 2016-08-26 NOTE — Telephone Encounter (Signed)
Faxed Medication varication to [facility] as requested. At [facility]. 

## 2016-10-11 ENCOUNTER — Encounter: Payer: Self-pay | Admitting: Family

## 2016-10-11 ENCOUNTER — Ambulatory Visit (INDEPENDENT_AMBULATORY_CARE_PROVIDER_SITE_OTHER): Payer: Medicare Other | Admitting: Family

## 2016-10-11 VITALS — BP 136/66 | HR 88 | Temp 97.7°F | Ht 67.0 in | Wt 169.0 lb

## 2016-10-11 DIAGNOSIS — J40 Bronchitis, not specified as acute or chronic: Secondary | ICD-10-CM

## 2016-10-11 NOTE — Progress Notes (Signed)
Subjective:    Patient ID: Derrick Sullivan, male    DOB: [birth year], 83 y.o.   MRN: [id number]  CC: Derrick Sullivan is a 82 y.o. male who presents today for an acute visit.    HPI: Chief complaint cough 2-3 , better 'much better' today. No fever, wheezing. Hasn't taken any medication. Some nasal congestion. No sore throat, dysuria, urinary frequency. Accompanied by caregiver. Patient has dementia No h/o pna    HISTORY:  Past Medical History:  Diagnosis Date  . Benign meningioma ([place]) 2006   s/p parietal resection  . Benign prostatic hypertrophy   . CAD (coronary artery disease)   . Carotid artery stenosis, asymptomatic 2008   hemodynamically insignificant  . Degenerative disk disease   . Dementia   . Diverticulosis of colon 2007  . Duodenal ulcer 07/2004   NSAID induced  . Hyperlipidemia   . Hypertension 09/26/2011  . Osteopenia   . Osteoporosis   . Peripheral vascular disease ([place])   . Presbyacusis    Past Surgical History:  Procedure Laterality Date  . BRAIN SURGERY     meningioma resection  . GALLBLADDER SURGERY  2000  . SHOULDER SURGERY  2000   rotator cuff   Family History  Problem Relation Age of Onset  . Diabetes Mother   . Heart disease Father     Allergies: Mirtazapine Current Outpatient Prescriptions on File Prior to Visit  Medication Sig Dispense Refill  . aspirin EC 81 MG tablet Take 81 mg by mouth daily. Reported on 02/11/2016    . atorvastatin (LIPITOR) 10 MG tablet TAKE ONE TABLET BY MOUTH EVERY DAY 90 tablet 1  . calcium carbonate (OSCAL) 1500 (600 Ca) MG TABS tablet Take 600 mg of elemental calcium by mouth 2 (two) times daily with a meal.    . citalopram (CELEXA) 20 MG tablet Take 20 mg by mouth daily. Reported on 02/11/2016    . docusate sodium (COLACE) 100 MG capsule Take 100 mg by mouth 2 (two) times daily.    . magnesium oxide (MAG-OX) 400 MG tablet Take 400 mg by mouth as needed (Constipation).    . memantine (NAMENDA) 5 MG tablet Take 5  mg by mouth 2 (two) times daily.    . Multiple Vitamins-Minerals (PRESERVISION AREDS 2) CAPS Take 1 capsule by mouth daily.    . [DISCONTINUED] pantoprazole (PROTONIX) 40 MG tablet Take 40 mg by mouth daily.       No current facility-administered medications on file prior to visit.     Social History  Substance Use Topics  . Smoking status: Never Smoker  . Smokeless tobacco: Former Systems developer    Quit date: 07/05/1956  . Alcohol use No    Review of Systems  Constitutional: Negative for chills and fever.  Respiratory: Negative for cough.   Cardiovascular: Negative for chest pain and palpitations.  Gastrointestinal: Negative for nausea and vomiting.      Objective:    BP 136/66   Pulse 88   Temp 97.7 F (36.5 C) (Oral)   Ht 5\' 7"  (1.702 m)   Wt 169 lb (76.7 kg)   SpO2 96%   BMI 26.47 kg/m    Physical Exam  Constitutional: Vital signs are normal. He appears well-developed and well-nourished.  HENT:  Head: Normocephalic and atraumatic.  Right Ear: Hearing, tympanic membrane, external ear and ear canal normal. No drainage, swelling or tenderness. Tympanic membrane is not injected, not erythematous and not bulging. No middle ear effusion. No decreased  hearing is noted.  Left Ear: Hearing, tympanic membrane, external ear and ear canal normal. No drainage, swelling or tenderness. Tympanic membrane is not injected, not erythematous and not bulging.  No middle ear effusion. No decreased hearing is noted.  Nose: Nose normal. Right sinus exhibits no maxillary sinus tenderness and no frontal sinus tenderness. Left sinus exhibits no maxillary sinus tenderness and no frontal sinus tenderness.  Mouth/Throat: Uvula is midline, oropharynx is clear and moist and mucous membranes are normal. No oropharyngeal exudate, posterior oropharyngeal edema, posterior oropharyngeal erythema or tonsillar abscesses.  Eyes: Conjunctivae are normal.  Cardiovascular: Regular rhythm and normal heart sounds.     Pulmonary/Chest: Effort normal and breath sounds normal. No respiratory distress. He has no wheezes. He has no rhonchi. He has no rales.  Lymphadenopathy:       Head (right side): No submental, no submandibular, no tonsillar, no preauricular, no posterior auricular and no occipital adenopathy present.       Head (left side): No submental, no submandibular, no tonsillar, no preauricular, no posterior auricular and no occipital adenopathy present.    He has no cervical adenopathy.  Neurological: He is alert.  Skin: Skin is warm and dry.  Psychiatric: He has a normal mood and affect. His speech is normal and behavior is normal.  Vitals reviewed.      Assessment & Plan:   1. Bronchitis Patient is afebrile. No adventitious lung sounds or labored breathing. SaO2 96%. Very reassured the patient feels "much better" today. Good historian and caregiver agrees he is better. We jointly decided conservative measures including over-the-counter cough medication, honey. Patient will let me know if he is not better and at that point we'll consider imaging. Return precautions given.    I have discontinued Mr. Kohout fluocinonide cream, diphenhydrAMINE, acetaminophen, divalproex, atenolol, donepezil, polyethylene glycol, furosemide, feeding supplement (ENSURE ENLIVE), [place], and mirabegron ER. I am also having him maintain his memantine, citalopram, PRESERVISION AREDS 2, aspirin EC, calcium carbonate, docusate sodium, magnesium oxide, and atorvastatin.   No orders of the defined types were placed in this encounter.   Return precautions given.   Risks, benefits, and alternatives of the medications and treatment plan prescribed today were discussed, and patient expressed understanding.   Education regarding symptom management and diagnosis given to patient on AVS.  Continue to follow with TULLO, Aris Everts, MD for routine health maintenance.   Greig Castilla and I agreed with  plan.   Mable Paris, FNP

## 2016-10-11 NOTE — Progress Notes (Signed)
Pre visit review using our clinic review tool, if applicable. No additional management support is needed unless otherwise documented below in the visit note. 

## 2016-10-11 NOTE — Patient Instructions (Signed)
Glad you are feeling better. As we discussed, if symptoms start worsen, I would like to chest x-ray. Please let me know if you have any new or worsening symptoms. Please use honey as a natural cough suppressant. He may also use Delsym as needed for cough  If there is no improvement in your symptoms, or if there is any worsening of symptoms, or if you have any additional concerns, please return for re-evaluation; or, if we are closed, consider going to the Emergency Room for evaluation if symptoms urgent.

## 2016-12-06 ENCOUNTER — Other Ambulatory Visit: Payer: Self-pay | Admitting: Internal Medicine

## 2016-12-06 DIAGNOSIS — Z79899 Other long term (current) drug therapy: Secondary | ICD-10-CM

## 2016-12-06 NOTE — Telephone Encounter (Signed)
Refilled: 06/06/2016 Last OV: 12/02/2015 Next OV: not scheduled

## 2016-12-06 NOTE — Telephone Encounter (Signed)
Lab Results  Component Value Date   ALT 22 02/01/2016   AST 23 02/01/2016   ALKPHOS 60 02/01/2016   BILITOT 1.5 (H) 02/01/2016   No refills until he has non fasting labs.

## 2016-12-10 ENCOUNTER — Other Ambulatory Visit: Payer: Self-pay | Admitting: Internal Medicine

## 2016-12-12 ENCOUNTER — Other Ambulatory Visit (INDEPENDENT_AMBULATORY_CARE_PROVIDER_SITE_OTHER): Payer: Medicare Other

## 2016-12-12 DIAGNOSIS — Z79899 Other long term (current) drug therapy: Secondary | ICD-10-CM | POA: Diagnosis not present

## 2016-12-12 LAB — COMPREHENSIVE METABOLIC PANEL
ALBUMIN: 3.9 g/dL (ref 3.5–5.2)
ALT: 11 U/L (ref 0–53)
AST: 14 U/L (ref 0–37)
Alkaline Phosphatase: 71 U/L (ref 39–117)
BUN: 29 mg/dL — ABNORMAL HIGH (ref 6–23)
CALCIUM: 9.4 mg/dL (ref 8.4–10.5)
CHLORIDE: 107 meq/L (ref 96–112)
CO2: 29 mEq/L (ref 19–32)
CREATININE: 1.24 mg/dL (ref 0.40–1.50)
GFR: 58.14 mL/min — ABNORMAL LOW (ref >60.00)
Glucose, Bld: 153 mg/dL — ABNORMAL HIGH (ref 70–99)
POTASSIUM: 3.9 meq/L (ref 3.5–5.1)
Sodium: 142 mEq/L (ref 135–145)
Total Bilirubin: 0.7 mg/dL (ref 0.2–1.2)
Total Protein: 6.5 g/dL (ref 6.0–8.3)

## 2016-12-14 NOTE — Telephone Encounter (Signed)
Spoke with pt's care giver and she stated that pt was here on 12/12/2016 and had labs drawn. Told caregiver that medication was refilled on 12/12/2016 as well. Caregiver gave a verbal understanding.

## 2016-12-20 ENCOUNTER — Telehealth: Payer: Self-pay | Admitting: Internal Medicine

## 2016-12-20 NOTE — Telephone Encounter (Signed)
Derrick Sullivan opt's caregiver called and states that pt has been constipated with no bowel movement. Pt has tried prune juice, miralax, and stool softeners. Please advise, thank you!  Call @ [phone]

## 2016-12-20 NOTE — Telephone Encounter (Signed)
Have him try Mild of magnesium  ,  If this works, start giving him miralax every night ,  Not just prn

## 2016-12-20 NOTE — Telephone Encounter (Signed)
Reason for call: small amount of bowel movement, hard form,  Symptoms: constipated, no abdominal pain , no swelling, a lot of straining when trying to pass bowel movement , encouraging him to drink more water , tried prune juice Duration 1 week  Medications:Miralax prn, stool softner twice daily,  Prune juice daily  Last seen for this problem:n/a Seen by:na/ Sonya caregiver please advise

## 2016-12-20 NOTE — Telephone Encounter (Signed)
Spoke with [facility] caregiver advised of below to take Milk of magnesium as directed on bottle, and if produces bowel movement. Take Miralax every night not just prn.  Spoke with Bonnita Nasuti caregiver she verbalized an understanding.

## 2016-12-24 ENCOUNTER — Encounter: Payer: Self-pay | Admitting: Emergency Medicine

## 2016-12-24 ENCOUNTER — Emergency Department
Admission: EM | Admit: 2016-12-24 | Discharge: 2016-12-24 | Disposition: A | Discharge status: Home / Self Care | Payer: Medicare Other | Attending: Emergency Medicine | Admitting: Emergency Medicine

## 2016-12-24 DIAGNOSIS — I2510 Atherosclerotic heart disease of native coronary artery without angina pectoris: Secondary | ICD-10-CM | POA: Diagnosis not present

## 2016-12-24 DIAGNOSIS — R339 Retention of urine, unspecified: Secondary | ICD-10-CM | POA: Diagnosis present

## 2016-12-24 DIAGNOSIS — N390 Urinary tract infection, site not specified: Secondary | ICD-10-CM | POA: Insufficient documentation

## 2016-12-24 DIAGNOSIS — I10 Essential (primary) hypertension: Secondary | ICD-10-CM | POA: Diagnosis not present

## 2016-12-24 DIAGNOSIS — Z7982 Long term (current) use of aspirin: Secondary | ICD-10-CM | POA: Insufficient documentation

## 2016-12-24 DIAGNOSIS — Z79899 Other long term (current) drug therapy: Secondary | ICD-10-CM | POA: Insufficient documentation

## 2016-12-24 DIAGNOSIS — E1151 Type 2 diabetes mellitus with diabetic peripheral angiopathy without gangrene: Secondary | ICD-10-CM | POA: Diagnosis not present

## 2016-12-24 LAB — URINALYSIS, COMPLETE (UACMP) WITH MICROSCOPIC
Bilirubin Urine: NEGATIVE
Glucose, UA: NEGATIVE mg/dL
Hgb urine dipstick: NEGATIVE
Ketones, ur: NEGATIVE mg/dL
Nitrite: NEGATIVE
Protein, ur: 30 mg/dL — AB
Specific Gravity, Urine: 1.015 (ref 1.005–1.030)
pH: 8 (ref 5.0–8.0)

## 2016-12-24 LAB — CBC
HCT: 39 % — ABNORMAL LOW (ref 40.0–52.0)
Hemoglobin: 13.4 g/dL (ref 13.0–18.0)
MCH: 31.9 pg (ref 26.0–34.0)
MCHC: 34.3 g/dL (ref 32.0–36.0)
MCV: 93 fL (ref 80.0–100.0)
Platelets: 167 10*3/uL (ref 150–440)
RBC: 4.2 MIL/uL — ABNORMAL LOW (ref 4.40–5.90)
RDW: 13.4 % (ref 11.5–14.5)
WBC: 13.3 10*3/uL — ABNORMAL HIGH (ref 3.8–10.6)

## 2016-12-24 LAB — COMPREHENSIVE METABOLIC PANEL
ALT: 11 U/L — ABNORMAL LOW (ref 17–63)
AST: 20 U/L (ref 15–41)
Albumin: 3.8 g/dL (ref 3.5–5.0)
Alkaline Phosphatase: 71 U/L (ref 38–126)
Anion gap: 6 (ref 5–15)
BUN: 29 mg/dL — AB (ref 6–20)
CHLORIDE: 104 mmol/L (ref 101–111)
CO2: 27 mmol/L (ref 22–32)
Calcium: 9.3 mg/dL (ref 8.9–10.3)
Creatinine, Ser: 1.18 mg/dL (ref 0.61–1.24)
GFR calc Af Amer: >60 mL/min (ref >60)
GFR, EST NON AFRICAN AMERICAN: 52 mL/min — AB (ref >60)
Glucose, Bld: 157 mg/dL — ABNORMAL HIGH (ref 65–99)
POTASSIUM: 4.2 mmol/L (ref 3.5–5.1)
SODIUM: 137 mmol/L (ref 135–145)
Total Bilirubin: 1.5 mg/dL — ABNORMAL HIGH (ref 0.3–1.2)
Total Protein: 7 g/dL (ref 6.5–8.1)

## 2016-12-24 MED ORDER — SODIUM CHLORIDE 0.9 % IV BOLUS (SEPSIS)
500.0000 mL | Freq: Once | INTRAVENOUS | Status: AC
  Administered 2016-12-24: 500 mL via INTRAVENOUS

## 2016-12-24 MED ORDER — CEFTRIAXONE SODIUM-DEXTROSE 1-3.74 GM-% IV SOLR
1.0000 g | Freq: Once | INTRAVENOUS | Status: AC
  Administered 2016-12-24: 1 g via INTRAVENOUS
  Filled 2016-12-24: qty 50

## 2016-12-24 MED ORDER — CEPHALEXIN 500 MG PO CAPS: 500.0000 mg | ORAL_CAPSULE | Freq: Two times a day (BID) | ORAL | 0 refills | Status: DC

## 2016-12-24 MED ORDER — DEXTROSE 5 % IV SOLN: 1.0000 g | Freq: Once | INTRAVENOUS | Status: DC

## 2016-12-24 NOTE — ED Triage Notes (Signed)
PT arrived via EMS from home with reports of urinary retention and constipation for the past week. EMS reports family and CNA staff at home c/o odor with urine and only urinating a small amount. Hx of Alzheimer's.

## 2016-12-24 NOTE — ED Provider Notes (Signed)
[facility] Emergency Department Provider Note   ____________________________________________   I have reviewed the triage vital signs and the nursing notes.   HISTORY  Chief Complaint Urinary Retention and Constipation  History limited by Alz Dementia  HPI Derrick Sullivan is a 83 y.o. male1 week history of decreased urine output and constipation. Patient describes having to make multiple trips to the bathroom with very Derrick Sullivan urine passing each attempt. He reports constipation symptoms existed prior to development of difficulty urinating. Pt denies nausea, vomiting, diarrhea, fever, chills, shortness of breath or abdominal pain.    Past Medical History:  Diagnosis Date  . Benign meningioma ([place]) 2006   s/p parietal resection  . Benign prostatic hypertrophy   . CAD (coronary artery disease)   . Carotid artery stenosis, asymptomatic 2008   hemodynamically insignificant  . Degenerative disk disease   . Dementia   . Diverticulosis of colon 2007  . Duodenal ulcer 07/2004   NSAID induced  . Hyperlipidemia   . Hypertension 09/26/2011  . Osteopenia   . Osteoporosis   . Peripheral vascular disease ([place])   . Presbyacusis     Patient Active Problem List   Diagnosis Date Noted  . Urinary frequency 03/01/2016  . UTI (urinary tract infection) 12/02/2015  . Abdominal pain, epigastric 10/31/2015  . Do not resuscitate discussion 05/13/2015  . Bilateral lower extremity edema 12/23/2014  . Autonomic dysfunction 10/15/2014  . Unsteady gait 05/13/2013  . Functional constipation 10/02/2012  . Mitral insufficiency 05/11/2012  . Other and unspecified hyperlipidemia 03/27/2012  . Neuropathy of foot 11/27/2011  . Duodenal ulcer due to nonsteroidal anti-inflammatory drug (NSAID) 09/26/2011  . Hypertension 09/26/2011  . Diabetes mellitus with peripheral artery disease ([place]) 09/26/2011  . Alzheimer's dementia with behavioral disturbance 08/23/2011  . Benign  prostatic hypertrophy   . Benign meningioma ([place])   . Osteopenia   . Carotid stenosis, left   . Diverticulosis of colon     Past Surgical History:  Procedure Laterality Date  . BRAIN SURGERY     meningioma resection  . GALLBLADDER SURGERY  2000  . SHOULDER SURGERY  2000   rotator cuff    Prior to Admission medications   Medication Sig Start Date End Date Taking? Authorizing Provider  aspirin EC 81 MG tablet Take 81 mg by mouth daily. Reported on 02/11/2016    Historical Provider, MD  atorvastatin (LIPITOR) 10 MG tablet TAKE ONE TABLET BY MOUTH EVERY DAY 12/12/16   Derrick Mc, MD  calcium carbonate (OSCAL) 1500 (600 Ca) MG TABS tablet Take 600 mg of elemental calcium by mouth 2 (two) times daily with a meal.    Historical Provider, MD  citalopram (CELEXA) 20 MG tablet Take 20 mg by mouth daily. Reported on 02/11/2016    Historical Provider, MD  docusate sodium (COLACE) 100 MG capsule Take 100 mg by mouth 2 (two) times daily.    Historical Provider, MD  magnesium oxide (MAG-OX) 400 MG tablet Take 400 mg by mouth as needed (Constipation).    Historical Provider, MD  memantine (NAMENDA) 5 MG tablet Take 5 mg by mouth 2 (two) times daily.    Historical Provider, MD  Multiple Vitamins-Minerals (PRESERVISION AREDS 2) CAPS Take 1 capsule by mouth daily.    Historical Provider, MD    Allergies Mirtazapine  Family History  Problem Relation Age of Onset  . Diabetes Mother   . Heart disease Father     Social History Social History  Substance Use  Topics  . Smoking status: Never Smoker  . Smokeless tobacco: Former Systems developer    Quit date: 07/05/1956  . Alcohol use No    Review of Systems Constitutional: No fever/chills Eyes: No visual changes. Cardiovascular: Denies chest pain. Respiratory: Denies Shortness of breath or cough Gastrointestinal: No abdominal pain.  No nausea, no vomiting.   Genitourinary: Positive for dysuria patient Musculoskeletal: Negative for back pain. Skin:  Negative for rash. Neurological: Negative for headaches  ____________________________________________   PHYSICAL EXAM:  VITAL SIGNS: ED Triage Vitals  Enc Vitals Group     BP 12/24/16 1336 137/67     Pulse Rate 12/24/16 1336 87     Resp 12/24/16 1336 16     Temp 12/24/16 1336 97.8 F (36.6 C)     Temp Source 12/24/16 1336 Oral     SpO2 12/24/16 1335 95 %     Weight 12/24/16 1336 160 lb 11.2 oz (72.9 kg)     Height 12/24/16 1336 5\' 8"  (1.727 m)     Head Circumference --      Peak Flow --      Pain Score 12/24/16 1336 0     Pain Loc --      Pain Edu? --      Excl. in [place]? --     Constitutional: Alert and oriented. Well appearing and in no acute distress. Eyes: Conjunctivae are normal. Head: Atraumatic.Marland Kitchen Mouth/Throat: Mucous membranes are moist.   Cardiovascular: Normal rate, regular rhythm.  Good peripheral circulation. Respiratory: Normal respiratory effort..  Gastrointestinal: Bowel sounds all 4 quadrants Genitourinary: Malodorous urine, constipation Musculoskeletal: No lower extremity tenderness nor edema.   Neurologic:  Normal speech and language. No gross focal neurologic deficits are appreciated.  Skin:  Skin is warm, dry and intact. No rash noted. Psychiatric: Mood and affect are normal. Speech and behavior are normal.  ____________________________________________   LABS (all labs ordered are listed, but only abnormal results are displayed)  Labs Reviewed  URINALYSIS, COMPLETE (UACMP) WITH MICROSCOPIC - Abnormal; Notable for the following:       Result Value   Color, Urine YELLOW (*)    APPearance CLOUDY (*)    Protein, ur 30 (*)    Leukocytes, UA LARGE (*)    Bacteria, UA RARE (*)    Squamous Epithelial / LPF 0-5 (*)    All other components within normal limits  CBC - Abnormal; Notable for the following:    WBC 13.3 (*)    RBC 4.20 (*)    HCT 39.0 (*)    All other components within normal limits  COMPREHENSIVE METABOLIC PANEL - Abnormal; Notable for  the following:    Glucose, Bld 157 (*)    BUN 29 (*)    ALT 11 (*)    Total Bilirubin 1.5 (*)    GFR calc non Af Amer 52 (*)    All other components within normal limits  URINE CULTURE   ____________________________________________  EKG none ____________________________________________  RADIOLOGY none ____________________________________________   PROCEDURES  Procedure(s) performed: no    Critical Care performed: no ____________________________________________   INITIAL IMPRESSION / ASSESSMENT AND PLAN / ED COURSE  Pertinent labs & imaging results that were available during my care of the patient were reviewed by me and considered in my medical decision making (see chart for details).  Pt presented with diagnosis of urinary tract infection based on clinical symptoms and reinforcing labs results. Pt received initial course of antibiotics, rocephin, and will continue antibiotics outpatient following discharge.  Pt and caregiver present instructed to return to ED if symptoms worsened. Pt to follow-up with his PCP.       ____________________________________________   FINAL CLINICAL IMPRESSION(S) / ED DIAGNOSES  Final diagnoses:  None      NEW MEDICATIONS STARTED DURING THIS VISIT:  New Prescriptions   No medications on file     Note:  This document was prepared using Dragon voice recognition software and may include unintentional dictation errors.    [place], PA-C 12/24/16 [place], MD 12/24/16 [phone]

## 2016-12-26 LAB — URINE CULTURE: Culture: >=100000 — AB

## 2016-12-27 NOTE — Progress Notes (Addendum)
ED Antimicrobial Stewardship Positive Culture Follow Up   Derrick Sullivan is an 83 y.o. male who presented to [facility] on 12/24/2016 with a chief complaint of  Chief Complaint  Patient presents with  . Urinary Retention  . Constipation    Recent Results (from the past 720 hour(s))  Urine culture     Status: Abnormal   Collection Time: 12/24/16  1:37 PM  Result Value Ref Range Status   Specimen Description URINE, RANDOM  Final   Special Requests NONE  Final   Culture >=100,000 COLONIES/mL MORGANELLA MORGANII (A)  Final   Report Status 12/26/2016 FINAL  Final   Organism ID, Bacteria MORGANELLA MORGANII (A)  Final      Susceptibility   Morganella morganii - MIC*    AMPICILLIN >=32 RESISTANT Resistant     CEFAZOLIN >=64 RESISTANT Resistant     CEFTRIAXONE <=1 SENSITIVE Sensitive     CIPROFLOXACIN 2 INTERMEDIATE Intermediate     GENTAMICIN <=1 SENSITIVE Sensitive     IMIPENEM 1 SENSITIVE Sensitive     NITROFURANTOIN 128 RESISTANT Resistant     TRIMETH/SULFA >=320 RESISTANT Resistant     AMPICILLIN/SULBACTAM 16 INTERMEDIATE Intermediate     PIP/TAZO <=4 SENSITIVE Sensitive     * >=100,000 COLONIES/mL MORGANELLA MORGANII   Assessment/Plan: Patient was given ceftriaxone 1 g IV x 1 in ED and discharged on cephalexin 500 mg bid x 10 days. Since amoxicillin/clav reaches higher concentrations in the urine, after speaking with Dr. [name] Levins, we will prescribe amoxicillin/clav 875 mg bid x 10 days. Spoke with patient's caregiver, Alleen Borne, and asked her to stop cephalexin and counseled on the new prescription. Answered all of Sonia's questions and she is to schedule appointment with PCP after course is completed. Called RX into [facility] where they will remind caregiver to stop cephalexin as well.  Darrow Bussing, PharmD Pharmacy Resident 12/27/2016 9:27 AM

## 2017-01-20 ENCOUNTER — Encounter: Payer: Self-pay | Admitting: Emergency Medicine

## 2017-01-20 ENCOUNTER — Emergency Department
Admission: EM | Admit: 2017-01-20 | Discharge: 2017-01-20 | Disposition: A | Discharge status: Home / Self Care | Payer: Medicare Other | Attending: Emergency Medicine | Admitting: Emergency Medicine

## 2017-01-20 ENCOUNTER — Emergency Department: Payer: Medicare Other

## 2017-01-20 DIAGNOSIS — I2510 Atherosclerotic heart disease of native coronary artery without angina pectoris: Secondary | ICD-10-CM | POA: Diagnosis not present

## 2017-01-20 DIAGNOSIS — Z720 Tobacco use: Secondary | ICD-10-CM | POA: Insufficient documentation

## 2017-01-20 DIAGNOSIS — Z79899 Other long term (current) drug therapy: Secondary | ICD-10-CM | POA: Diagnosis not present

## 2017-01-20 DIAGNOSIS — K5900 Constipation, unspecified: Secondary | ICD-10-CM | POA: Diagnosis present

## 2017-01-20 DIAGNOSIS — I10 Essential (primary) hypertension: Secondary | ICD-10-CM | POA: Diagnosis not present

## 2017-01-20 DIAGNOSIS — Z7982 Long term (current) use of aspirin: Secondary | ICD-10-CM | POA: Insufficient documentation

## 2017-01-20 LAB — CBC WITH DIFFERENTIAL/PLATELET
Basophils Absolute: 0 10*3/uL (ref 0–0.1)
Basophils Relative: 1 %
Eosinophils Absolute: 0.2 10*3/uL (ref 0–0.7)
Eosinophils Relative: 2 %
HEMATOCRIT: 38.6 % — AB (ref 40.0–52.0)
Hemoglobin: 13.1 g/dL (ref 13.0–18.0)
LYMPHS ABS: 1.5 10*3/uL (ref 1.0–3.6)
LYMPHS PCT: 19 %
MCH: 31.7 pg (ref 26.0–34.0)
MCHC: 34 g/dL (ref 32.0–36.0)
MCV: 93.2 fL (ref 80.0–100.0)
Monocytes Absolute: 0.8 10*3/uL (ref 0.2–1.0)
Monocytes Relative: 10 %
NEUTROS PCT: 68 %
Neutro Abs: 5.5 10*3/uL (ref 1.4–6.5)
Platelets: 163 10*3/uL (ref 150–440)
RBC: 4.15 MIL/uL — AB (ref 4.40–5.90)
RDW: 13.4 % (ref 11.5–14.5)
WBC: 8 10*3/uL (ref 3.8–10.6)

## 2017-01-20 LAB — URINALYSIS, COMPLETE (UACMP) WITH MICROSCOPIC
Bacteria, UA: NONE SEEN
Bilirubin Urine: NEGATIVE
Glucose, UA: NEGATIVE mg/dL
HGB URINE DIPSTICK: NEGATIVE
Ketones, ur: NEGATIVE mg/dL
Leukocytes, UA: NEGATIVE
Nitrite: NEGATIVE
PH: 7 (ref 5.0–8.0)
Protein, ur: NEGATIVE mg/dL
SPECIFIC GRAVITY, URINE: 1.016 (ref 1.005–1.030)

## 2017-01-20 LAB — COMPREHENSIVE METABOLIC PANEL
ALT: 14 U/L — ABNORMAL LOW (ref 17–63)
AST: 22 U/L (ref 15–41)
Albumin: 4 g/dL (ref 3.5–5.0)
Alkaline Phosphatase: 66 U/L (ref 38–126)
Anion gap: 7 (ref 5–15)
BUN: 24 mg/dL — ABNORMAL HIGH (ref 6–20)
CHLORIDE: 103 mmol/L (ref 101–111)
CO2: 27 mmol/L (ref 22–32)
Calcium: 9.7 mg/dL (ref 8.9–10.3)
Creatinine, Ser: 1.21 mg/dL (ref 0.61–1.24)
GFR, EST AFRICAN AMERICAN: 59 mL/min — AB (ref >60)
GFR, EST NON AFRICAN AMERICAN: 51 mL/min — AB (ref >60)
Glucose, Bld: 102 mg/dL — ABNORMAL HIGH (ref 65–99)
Potassium: 3.9 mmol/L (ref 3.5–5.1)
SODIUM: 137 mmol/L (ref 135–145)
Total Bilirubin: 0.9 mg/dL (ref 0.3–1.2)
Total Protein: 6.8 g/dL (ref 6.5–8.1)

## 2017-01-20 LAB — LIPASE, BLOOD: Lipase: 27 U/L (ref 11–51)

## 2017-01-20 MED ORDER — POLYETHYLENE GLYCOL 3350 17 G PO PACK: 17.0000 g | PACK | Freq: Every day | ORAL | 0 refills | Status: DC

## 2017-01-20 NOTE — ED Triage Notes (Signed)
Pt reports constipation x3 days, thinks he is impacted.

## 2017-01-20 NOTE — ED Provider Notes (Signed)
[facility] Emergency Department Provider Note  ____________________________________________   I have reviewed the triage vital signs and the nursing notes.   HISTORY  Chief Complaint Constipation    HPI Derrick Sullivan is a 83 y.o. male he states he has not had a factory bowel movement in a few days. He denies fever or abdominal pain. He states he can feel stool in there. He feels it is making hard for him to urinate. He is not having any dysuria or urinary frequency. Denies nausea or vomiting. Symptoms for a few days, nothing makes but a nothing makes worst tried 1 dose of MiraLAX at home with limited success.       Past Medical History:  Diagnosis Date  . Benign meningioma ([place]) 2006   s/p parietal resection  . Benign prostatic hypertrophy   . CAD (coronary artery disease)   . Carotid artery stenosis, asymptomatic 2008   hemodynamically insignificant  . Degenerative disk disease   . Dementia   . Diverticulosis of colon 2007  . Duodenal ulcer 07/2004   NSAID induced  . Hyperlipidemia   . Hypertension 09/26/2011  . Osteopenia   . Osteoporosis   . Peripheral vascular disease ([place])   . Presbyacusis     Patient Active Problem List   Diagnosis Date Noted  . Urinary frequency 03/01/2016  . UTI (urinary tract infection) 12/02/2015  . Abdominal pain, epigastric 10/31/2015  . Do not resuscitate discussion 05/13/2015  . Bilateral lower extremity edema 12/23/2014  . Autonomic dysfunction 10/15/2014  . Unsteady gait 05/13/2013  . Functional constipation 10/02/2012  . Mitral insufficiency 05/11/2012  . Other and unspecified hyperlipidemia 03/27/2012  . Neuropathy of foot 11/27/2011  . Duodenal ulcer due to nonsteroidal anti-inflammatory drug (NSAID) 09/26/2011  . Hypertension 09/26/2011  . Diabetes mellitus with peripheral artery disease ([place]) 09/26/2011  . Alzheimer's dementia with behavioral disturbance 08/23/2011  . Benign prostatic  hypertrophy   . Benign meningioma ([place])   . Osteopenia   . Carotid stenosis, left   . Diverticulosis of colon     Past Surgical History:  Procedure Laterality Date  . BRAIN SURGERY     meningioma resection  . GALLBLADDER SURGERY  2000  . SHOULDER SURGERY  2000   rotator cuff    Prior to Admission medications   Medication Sig Start Date End Date Taking? Authorizing Provider  aspirin EC 81 MG tablet Take 81 mg by mouth daily. Reported on 02/11/2016    [provider]  atorvastatin (LIPITOR) 10 MG tablet TAKE ONE TABLET BY MOUTH EVERY DAY 12/12/16   Crecencio Mc, MD  calcium carbonate (OSCAL) 1500 (600 Ca) MG TABS tablet Take 600 mg of elemental calcium by mouth 2 (two) times daily with a meal.    [provider]  cephALEXin (KEFLEX) 500 MG capsule Take 1 capsule (500 mg total) by mouth 2 (two) times daily. 12/24/16   Lavonia Drafts, MD  citalopram (CELEXA) 20 MG tablet Take 20 mg by mouth daily. Reported on 02/11/2016    [provider]  docusate sodium (COLACE) 100 MG capsule Take 100 mg by mouth 2 (two) times daily.    [provider]  magnesium oxide (MAG-OX) 400 MG tablet Take 400 mg by mouth as needed (Constipation).    [provider]  memantine (NAMENDA) 5 MG tablet Take 5 mg by mouth 2 (two) times daily.    [provider]  Multiple Vitamins-Minerals (PRESERVISION AREDS 2) CAPS Take 1 capsule by mouth  daily.    [provider]  polyethylene glycol (MIRALAX) packet Take 17 g by mouth daily. 01/20/17   Schuyler Amor, MD    Allergies Mirtazapine  Family History  Problem Relation Age of Onset  . Diabetes Mother   . Heart disease Father     Social History Social History  Substance Use Topics  . Smoking status: Never Smoker  . Smokeless tobacco: Former Systems developer    Quit date: 07/05/1956  . Alcohol use No    Review of Systems Constitutional: No fever/chills Eyes: No visual changes. ENT: No sore throat. No stiff  neck no neck pain Cardiovascular: Denies chest pain. Respiratory: Denies shortness of breath. Gastrointestinal:   no vomiting.  No diarrhea.  Positive constipation. Genitourinary: Negative for dysuria. Musculoskeletal: Negative lower extremity swelling Skin: Negative for rash. Neurological: Negative for severe headaches, focal weakness or numbness. 10-point ROS otherwise negative.  ____________________________________________   PHYSICAL EXAM:  VITAL SIGNS: ED Triage Vitals  Enc Vitals Group     BP 01/20/17 1737 (!) 142/79     Pulse Rate 01/20/17 1737 76     Resp 01/20/17 1737 15     Temp 01/20/17 1737 98.6 F (37 C)     Temp Source 01/20/17 1737 Oral     SpO2 01/20/17 1737 99 %     Weight 01/20/17 1737 160 lb (72.6 kg)     Height 01/20/17 1737 5\' 8"  (1.727 m)     Head Circumference --      Peak Flow --      Pain Score 01/20/17 1736 0     Pain Loc --      Pain Edu? --      Excl. in [place]? --     Constitutional: Alert and oriented. Well appearing and in no acute distress. Mouth/Throat: Mucous membranes are moist.  Oropharynx non-erythematous. Neck: No stridor.   Nontender with no meningismus Cardiovascular: Normal rate, regular rhythm. Grossly normal heart sounds.  Good peripheral circulation. Respiratory: Normal respiratory effort.  No retractions. Lungs CTAB. Abdominal: Soft and nontender. No distention. No guarding no rebound She'll exam, impacted stool in the vault no bleeding or lesions. Back:  There is no focal tenderness or step off.  there is no midline tenderness there are no lesions noted. there is no CVA tenderness Musculoskeletal: No lower extremity tenderness, no upper extremity tenderness. No joint effusions, no DVT signs strong distal pulses no edema Neurologic:  Normal speech and language. No gross focal neurologic deficits are appreciated.  Skin:  Skin is warm, dry and intact. No rash noted. Psychiatric: Mood and affect are normal. Speech and behavior are  normal.  ____________________________________________   LABS (all labs ordered are listed, but only abnormal results are displayed)  Labs Reviewed  COMPREHENSIVE METABOLIC PANEL - Abnormal; Notable for the following:       Result Value   Glucose, Bld 102 (*)    BUN 24 (*)    ALT 14 (*)    GFR calc non Af Amer 51 (*)    GFR calc Af Amer 59 (*)    All other components within normal limits  CBC WITH DIFFERENTIAL/PLATELET - Abnormal; Notable for the following:    RBC 4.15 (*)    HCT 38.6 (*)    All other components within normal limits  URINALYSIS, COMPLETE (UACMP) WITH MICROSCOPIC - Abnormal; Notable for the following:    Color, Urine YELLOW (*)    APPearance CLEAR (*)    Squamous Epithelial / LPF  0-5 (*)    All other components within normal limits  LIPASE, BLOOD   ____________________________________________  EKG  I personally interpreted any EKGs ordered by me or triage  ____________________________________________  RADIOLOGY  I reviewed any imaging ordered by me or triage that were performed during my shift and, if possible, patient and/or family made aware of any abnormal findings. ____________________________________________   PROCEDURES  Procedure(s) performed: Fecal disimpaction performed by me with moderate success, patient tolerated it well  Procedures  Critical Care performed: None  ____________________________________________   INITIAL IMPRESSION / ASSESSMENT AND PLAN / ED COURSE  Pertinent labs & imaging results that were available during my care of the patient were reviewed by me and considered in my medical decision making (see chart for details).  he here for constipation. He did have a fecal impaction. He had minimal urinary retention which I feel is secondary to his constipation. After we disimpacted him and gave him an enema, he had a very satisfying bowel movement which was commensurate with the size of the fecal impaction that we see on  x-ray. No evidence of bleeding or mass and no evidence of obstruction and no evidence of other intra-abdominal pathology. After the constipation was relieved by enema and disimpaction patient felt 100% better. He had a slight urinary retention on discharge. He would prefer not to have a Foley catheter and I think most likely this will resolve as he continues to move his bowels. We're setting him home with medications to hopefully ensure that that continues and we will have him follow closely with his primary care doctor, GI, and urology as needed. Patient remained very awake alert and in no acute distress, return precautions and follow-up given and understood    ____________________________________________   FINAL CLINICAL IMPRESSION(S) / ED DIAGNOSES  Final diagnoses:  Constipation, unspecified constipation type      This chart was dictated using voice recognition software.  Despite best efforts to proofread,  errors can occur which can change meaning.      Schuyler Amor, MD 01/20/17 [phone]

## 2017-01-20 NOTE — ED Notes (Signed)
Pt with 215 ml residual urine.

## 2017-01-20 NOTE — ED Notes (Signed)
Pt up to BR with moderate amount of stool. Pt able to urinate at his time.

## 2017-01-20 NOTE — ED Notes (Signed)
Pt inquiring about wait time. Pt updated on this and is understanding at this time.

## 2017-01-20 NOTE — Telephone Encounter (Signed)
Derrick Sullivan from [facility], pt is still having this problem. Please call her at [phone].

## 2017-01-20 NOTE — Discharge Instructions (Signed)
If you have abdominal pain, difficulty urinating, fever, vomiting or you feel worse in any way return to the emergency room. Follow closely with her primary care doctor and as needed with the other 2 specialist listed above.

## 2017-01-20 NOTE — ED Notes (Signed)
Pt with bladder scan =486.

## 2017-01-23 NOTE — Telephone Encounter (Signed)
I see no reason NOT to take miralax every night, unless it is causing him to have loose stools , which have not been reported to me. .  Nobody should end up in the RE for constipation

## 2017-01-23 NOTE — Telephone Encounter (Signed)
Spoke with Derrick Sullivan and she stated that the pt went to the ED. He received to enemas well at the ED and they sent him home with miralax. Courtney asked if you were ok with the pt taking the miralax every other night and then if not having bowel movements increase it to nightly. Please advise.

## 2017-01-24 NOTE — Telephone Encounter (Signed)
Spoke with Loma Sousa and informed her of Dr. Lupita Dawn message below.

## 2017-03-27 ENCOUNTER — Other Ambulatory Visit: Payer: Self-pay | Admitting: Internal Medicine

## 2017-04-05 ENCOUNTER — Encounter: Payer: Self-pay | Admitting: Family

## 2017-04-05 ENCOUNTER — Ambulatory Visit (INDEPENDENT_AMBULATORY_CARE_PROVIDER_SITE_OTHER): Payer: Medicare Other | Admitting: Family

## 2017-04-05 VITALS — BP 118/80 | HR 74 | Temp 97.9°F | Ht 68.0 in | Wt 167.1 lb

## 2017-04-05 DIAGNOSIS — I872 Venous insufficiency (chronic) (peripheral): Secondary | ICD-10-CM

## 2017-04-05 NOTE — Progress Notes (Signed)
Pre visit review using our clinic review tool, if applicable. No additional management support is needed unless otherwise documented below in the visit note. 

## 2017-04-05 NOTE — Patient Instructions (Signed)
Concern for poor circulation  Referral to vascular has been placed. We will call you with an appointment  If there is no improvement in your symptoms, or if there is any worsening of symptoms, or if you have any additional concerns, please return for re-evaluation; or, if we are closed, consider going to the Emergency Room for evaluation if symptoms urgent.

## 2017-04-05 NOTE — Progress Notes (Signed)
Subjective:    Patient ID: Derrick Sullivan, male    DOB: Apr 19, 1924, 83 y.o.   MRN: [id number]  CC: Derrick Sullivan is a 83 y.o. male who presents today for an acute visit.    HPI: Complains of decreased sensation bilateral feet x months, worsening. accompanied by caregiver.  H/o dementia and DM. No pain with walking or in calf. Feels like they 'get cold.'  No injury to feet.   No leg swelling.   Uses cane. No falls.     HISTORY:  Past Medical History:  Diagnosis Date  . Benign meningioma ([place]) 2006   s/p parietal resection  . Benign prostatic hypertrophy   . CAD (coronary artery disease)   . Carotid artery stenosis, asymptomatic 2008   hemodynamically insignificant  . Degenerative disk disease   . Dementia   . Diverticulosis of colon 2007  . Duodenal ulcer 07/2004   NSAID induced  . Hyperlipidemia   . Hypertension 09/26/2011  . Osteopenia   . Osteoporosis   . Peripheral vascular disease ([place])   . Presbyacusis    Past Surgical History:  Procedure Laterality Date  . BRAIN SURGERY     meningioma resection  . GALLBLADDER SURGERY  2000  . SHOULDER SURGERY  2000   rotator cuff   Family History  Problem Relation Age of Onset  . Diabetes Mother   . Heart disease Father     Allergies: Mirtazapine Current Outpatient Prescriptions on File Prior to Visit  Medication Sig Dispense Refill  . aspirin EC 81 MG tablet Take 81 mg by mouth daily. Reported on 02/11/2016    . atorvastatin (LIPITOR) 10 MG tablet TAKE ONE TABLET BY MOUTH EVERY DAY 90 tablet 1  . calcium carbonate (OSCAL) 1500 (600 Ca) MG TABS tablet Take 600 mg of elemental calcium by mouth 2 (two) times daily with a meal.    . cephALEXin (KEFLEX) 500 MG capsule Take 1 capsule (500 mg total) by mouth 2 (two) times daily. 20 capsule 0  . citalopram (CELEXA) 20 MG tablet Take 20 mg by mouth daily. Reported on 02/11/2016    . citalopram (CELEXA) 20 MG tablet TAKE ONE TABLET BY MOUTH EVERY DAY 90 tablet 1  .  docusate sodium (COLACE) 100 MG capsule Take 100 mg by mouth 2 (two) times daily.    . magnesium oxide (MAG-OX) 400 MG tablet Take 400 mg by mouth as needed (Constipation).    . memantine (NAMENDA) 5 MG tablet Take 5 mg by mouth 2 (two) times daily.    . Multiple Vitamins-Minerals (PRESERVISION AREDS 2) CAPS Take 1 capsule by mouth daily.    . polyethylene glycol (MIRALAX) packet Take 17 g by mouth daily. 14 each 0  . [DISCONTINUED] pantoprazole (PROTONIX) 40 MG tablet Take 40 mg by mouth daily.       No current facility-administered medications on file prior to visit.     Social History  Substance Use Topics  . Smoking status: Never Smoker  . Smokeless tobacco: Former Systems developer    Quit date: 07/05/1956  . Alcohol use No    Review of Systems  Constitutional: Negative for chills and fever.  Respiratory: Negative for cough and shortness of breath.   Cardiovascular: Negative for chest pain, palpitations and leg swelling.  Gastrointestinal: Negative for nausea and vomiting.  Skin: Negative for color change, rash and wound.  Neurological: Negative for weakness and numbness.      Objective:    BP 118/80   Pulse  74   Temp 97.9 F (36.6 C) (Oral)   Ht 5\' 8"  (1.727 m)   Wt 167 lb 1.9 oz (75.8 kg)   SpO2 97%   BMI 25.41 kg/m    Physical Exam  Constitutional: He appears well-developed and well-nourished.  Cardiovascular: Regular rhythm and normal heart sounds.   No LE edema, palpable cords or masses. No erythema or increased warmth. No asymmetry in calf size when compared bilaterally LE hair growth symmetric and present. No discoloration of varicosities noted. LE warm and palpable pedal pulses.   Pulmonary/Chest: Effort normal and breath sounds normal. No respiratory distress. He has no wheezes. He has no rhonchi. He has no rales.  Neurological: He is alert.  Sensation intact with microfilament exam BLE.  Skin: Skin is warm and dry. No lesion and no rash noted.  Psychiatric: He  has a normal mood and affect. His speech is normal and behavior is normal.  Vitals reviewed.      Assessment & Plan:   1. Venous insufficiency Reassured by physical exam today. Palpable pedal pulses. Sensation intact. No wounds. Discussed with patient his feeling of cold may be related to arterial or venous insufficiency. No calf pain to suggest claudication. We agreed on consult to vascular for further evaluation. Return precautions given.    - Ambulatory referral to Vascular Surgery    I am having Derrick Sullivan maintain his memantine, citalopram, PRESERVISION AREDS 2, aspirin EC, calcium carbonate, docusate sodium, magnesium oxide, atorvastatin, cephALEXin, polyethylene glycol, and citalopram.   No orders of the defined types were placed in this encounter.   Return precautions given.   Risks, benefits, and alternatives of the medications and treatment plan prescribed today were discussed, and patient expressed understanding.   Education regarding symptom management and diagnosis given to patient on AVS.  Continue to follow with Crecencio Mc, MD for routine health maintenance.   Greig Castilla and I agreed with plan.   Mable Paris, FNP

## 2017-04-06 ENCOUNTER — Encounter: Payer: Self-pay | Admitting: Family

## 2017-04-25 ENCOUNTER — Ambulatory Visit (INDEPENDENT_AMBULATORY_CARE_PROVIDER_SITE_OTHER): Payer: Medicare Other | Admitting: Family

## 2017-04-25 ENCOUNTER — Encounter: Payer: Self-pay | Admitting: Family

## 2017-04-25 VITALS — BP 120/76 | HR 77 | Temp 98.6°F | Resp 14 | Wt 164.6 lb

## 2017-04-25 DIAGNOSIS — T148XXA Other injury of unspecified body region, initial encounter: Secondary | ICD-10-CM

## 2017-04-25 NOTE — Patient Instructions (Signed)
As discussed, please keep appointment with [facility] ( vascular) in September.   Suspect blister from heating pad. Please refrain from heating pad and use WARM soaks  Please prop up heel with rolled pillow so it's in the air to allow blister to heal.  If there is no improvement in your symptoms, or if there is any worsening of symptoms, or if you have any additional concerns, please return for re-evaluation; or, if we are closed, consider going to the Emergency Room for evaluation if symptoms urgent.

## 2017-04-25 NOTE — Progress Notes (Signed)
Pre-visit discussion using our clinic review tool. No additional management support is needed unless otherwise documented below in the visit note.  

## 2017-04-25 NOTE — Progress Notes (Signed)
Subjective:    Patient ID: Derrick Sullivan, male    DOB: [birth year], 83 y.o.   MRN: [id number]  CC: Derrick Sullivan is a 83 y.o. male who presents today for an acute visit.    HPI: CC: blister from on right heel x one day, unchanged. Accompanied by caregiver  Reports using  Heating pad at end of bed 'because they get cold.'   Painful to touch. No fever, N, v,  Seen one month ago for decreased sensation to feet and referred to vascular - plans to see [facility] in one month.     HISTORY:  Past Medical History:  Diagnosis Date  . Benign meningioma ([place]) 2006   s/p parietal resection  . Benign prostatic hypertrophy   . CAD (coronary artery disease)   . Carotid artery stenosis, asymptomatic 2008   hemodynamically insignificant  . Degenerative disk disease   . Dementia   . Diverticulosis of colon 2007  . Duodenal ulcer 07/2004   NSAID induced  . Hyperlipidemia   . Hypertension 09/26/2011  . Osteopenia   . Osteoporosis   . Peripheral vascular disease ([place])   . Presbyacusis    Past Surgical History:  Procedure Laterality Date  . BRAIN SURGERY     meningioma resection  . GALLBLADDER SURGERY  2000  . SHOULDER SURGERY  2000   rotator cuff   Family History  Problem Relation Age of Onset  . Diabetes Mother   . Heart disease Father     Allergies: Mirtazapine Current Outpatient Prescriptions on File Prior to Visit  Medication Sig Dispense Refill  . aspirin EC 81 MG tablet Take 81 mg by mouth daily. Reported on 02/11/2016    . atorvastatin (LIPITOR) 10 MG tablet TAKE ONE TABLET BY MOUTH EVERY DAY 90 tablet 1  . calcium carbonate (OSCAL) 1500 (600 Ca) MG TABS tablet Take 600 mg of elemental calcium by mouth 2 (two) times daily with a meal.    . cephALEXin (KEFLEX) 500 MG capsule Take 1 capsule (500 mg total) by mouth 2 (two) times daily. 20 capsule 0  . citalopram (CELEXA) 20 MG tablet Take 20 mg by mouth daily. Reported on 02/11/2016    . citalopram (CELEXA) 20 MG tablet  TAKE ONE TABLET BY MOUTH EVERY DAY 90 tablet 1  . docusate sodium (COLACE) 100 MG capsule Take 100 mg by mouth 2 (two) times daily.    . magnesium oxide (MAG-OX) 400 MG tablet Take 400 mg by mouth as needed (Constipation).    . memantine (NAMENDA) 5 MG tablet Take 5 mg by mouth 2 (two) times daily.    . Multiple Vitamins-Minerals (PRESERVISION AREDS 2) CAPS Take 1 capsule by mouth daily.    . polyethylene glycol (MIRALAX) packet Take 17 g by mouth daily. 14 each 0  . [DISCONTINUED] pantoprazole (PROTONIX) 40 MG tablet Take 40 mg by mouth daily.       No current facility-administered medications on file prior to visit.     Social History  Substance Use Topics  . Smoking status: Never Smoker  . Smokeless tobacco: Former Systems developer    Quit date: 07/05/1956  . Alcohol use No    Review of Systems  Constitutional: Negative for chills and fever.  Respiratory: Negative for cough.   Cardiovascular: Negative for chest pain, palpitations and leg swelling.  Gastrointestinal: Negative for nausea and vomiting.      Objective:    BP 120/76 (BP Location: Left Arm, Patient Position: Sitting, Cuff Size:  Normal)   Pulse 77   Temp 98.6 F (37 C) (Oral)   Resp 14   Wt 164 lb 9.6 oz (74.7 kg)   SpO2 96%   BMI 25.03 kg/m    Physical Exam  Constitutional: He appears well-developed and well-nourished.  Cardiovascular: Regular rhythm and normal heart sounds.   Pulmonary/Chest: Effort normal and breath sounds normal. No respiratory distress. He has no wheezes. He has no rhonchi. He has no rales.  Neurological: He is alert.  Skin: Skin is warm and dry. Lesion and rash noted. Rash is vesicular.  Intact right posterior heel vesicle. Appears clear filled. No increased warmth, erythema, streaking, purulent discharge. See picture  Psychiatric: He has a normal mood and affect. His speech is normal and behavior is normal.  Vitals reviewed.        Assessment & Plan:   1. Blister Suspect pressure sore  or burn from heating pad.Skin intact. No evidence of infection. Advised conservative therapy with elevation of right heel. Reiterated the importance to keep vascular appointment. Return precautions given.     I am having Derrick Sullivan maintain his memantine, citalopram, PRESERVISION AREDS 2, aspirin EC, calcium carbonate, docusate sodium, magnesium oxide, atorvastatin, cephALEXin, polyethylene glycol, and citalopram.   No orders of the defined types were placed in this encounter.   Return precautions given.   Risks, benefits, and alternatives of the medications and treatment plan prescribed today were discussed, and patient expressed understanding.   Education regarding symptom management and diagnosis given to patient on AVS.  Continue to follow with Crecencio Mc, MD for routine health maintenance.   Greig Castilla and I agreed with plan.   Mable Paris, FNP

## 2017-05-11 ENCOUNTER — Ambulatory Visit (INDEPENDENT_AMBULATORY_CARE_PROVIDER_SITE_OTHER): Payer: Medicare Other

## 2017-05-11 VITALS — BP 130/70 | HR 72 | Temp 98.3°F | Resp 14 | Ht 68.0 in | Wt 163.8 lb

## 2017-05-11 DIAGNOSIS — Z1389 Encounter for screening for other disorder: Secondary | ICD-10-CM | POA: Diagnosis not present

## 2017-05-11 DIAGNOSIS — Z23 Encounter for immunization: Secondary | ICD-10-CM | POA: Diagnosis not present

## 2017-05-11 DIAGNOSIS — Z0000 Encounter for general adult medical examination without abnormal findings: Secondary | ICD-10-CM | POA: Diagnosis not present

## 2017-05-11 DIAGNOSIS — Z1331 Encounter for screening for depression: Secondary | ICD-10-CM

## 2017-05-11 NOTE — Patient Instructions (Addendum)
  Derrick Sullivan , Thank you for taking time to come for your Medicare Wellness Visit. I appreciate your ongoing commitment to your health goals. Please review the following plan we discussed and let me know if I can assist you in the future.   Follow up with Dr. Derrel Nip as needed.    Bring a copy of your [place] and/or Living Will to be scanned into chart.  Have a great day!  These are the goals we discussed: Goals    . Increase physical activity          Chair exercises as demonstrated.  Educational material provided.    . Increase water intake          Stay hydrated. Increase by 1 glass daily.       This is a list of the screening recommended for you and due dates:  Health Maintenance  Topic Date Due  . Tetanus Vaccine  06/08/1943  . Eye exam for diabetics  05/14/2014  . Hemoglobin A1C  01/26/2015  . Complete foot exam   07/29/2015  . Urine Protein Check  12/22/2015  . Flu Shot  Completed  . Pneumonia vaccines  Completed

## 2017-05-11 NOTE — Progress Notes (Signed)
Subjective:   Derrick Sullivan is a 83 y.o. male who presents for Medicare Annual/Subsequent preventive examination.  Review of Systems:  No ROS.  Medicare Wellness Visit. Additional risk factors are reflected in the social history.  Cardiac Risk Factors include: advanced age (>26men, >67 women);male gender;hypertension;diabetes mellitus     Objective:    Vitals: BP 130/70 (BP Location: Left Arm, Patient Position: Sitting, Cuff Size: Normal)   Pulse 72   Temp 98.3 F (36.8 C) (Oral)   Resp 14   Ht 5\' 8"  (1.727 m)   Wt 163 lb 12.8 oz (74.3 kg)   BMI 24.91 kg/m   Body mass index is 24.91 kg/m.  Tobacco History  Smoking Status  . Never Smoker  Smokeless Tobacco  . Former Systems developer  . Quit date: 07/05/1956     Counseling given: Not Answered   Past Medical History:  Diagnosis Date  . Benign meningioma ([place]) 2006   s/p parietal resection  . Benign prostatic hypertrophy   . CAD (coronary artery disease)   . Carotid artery stenosis, asymptomatic 2008   hemodynamically insignificant  . Degenerative disk disease   . Dementia   . Diverticulosis of colon 2007  . Duodenal ulcer 07/2004   NSAID induced  . Hyperlipidemia   . Hypertension 09/26/2011  . Osteopenia   . Osteoporosis   . Peripheral vascular disease ([place])   . Presbyacusis    Past Surgical History:  Procedure Laterality Date  . BRAIN SURGERY     meningioma resection  . GALLBLADDER SURGERY  2000  . SHOULDER SURGERY  2000   rotator cuff   Family History  Problem Relation Age of Onset  . Diabetes Mother   . Heart disease Father    History  Sexual Activity  . Sexual activity: No    Outpatient Encounter Prescriptions as of 05/11/2017  Medication Sig  . aspirin EC 81 MG tablet Take 81 mg by mouth daily. Reported on 02/11/2016  . atorvastatin (LIPITOR) 10 MG tablet TAKE ONE TABLET BY MOUTH EVERY DAY  . calcium carbonate (OSCAL) 1500 (600 Ca) MG TABS tablet Take 600 mg of elemental calcium by mouth 2 (two)  times daily with a meal.  . citalopram (CELEXA) 20 MG tablet TAKE ONE TABLET BY MOUTH EVERY DAY  . docusate sodium (COLACE) 100 MG capsule Take 100 mg by mouth 2 (two) times daily.  . magnesium oxide (MAG-OX) 400 MG tablet Take 400 mg by mouth as needed (Constipation).  . memantine (NAMENDA) 5 MG tablet Take 5 mg by mouth 2 (two) times daily.  . Multiple Vitamins-Minerals (PRESERVISION AREDS 2) CAPS Take 1 capsule by mouth daily.  . polyethylene glycol (MIRALAX) packet Take 17 g by mouth daily.  . [DISCONTINUED] cephALEXin (KEFLEX) 500 MG capsule Take 1 capsule (500 mg total) by mouth 2 (two) times daily.  . [DISCONTINUED] citalopram (CELEXA) 20 MG tablet Take 20 mg by mouth daily. Reported on 02/11/2016   No facility-administered encounter medications on file as of 05/11/2017.     Activities of Daily Living In your present state of health, do you have any difficulty performing the following activities: 05/11/2017 05/11/2016  Hearing? Tempie Donning  Vision? N N  Difficulty concentrating or making decisions? Tempie Donning  Walking or climbing stairs? Y Y  Dressing or bathing? N N  Doing errands, shopping? Tempie Donning  Preparing Food and eating ? Y Y  Using the Toilet? Y N  In the past six months, have you accidently leaked urine?  N Y  Comment - Urinary frequency.  Stable and followed by PCP  Do you have problems with loss of bowel control? N Y  Comment - Accidents when drinking prune juice.  Stable and folowed by PCP.  Managing your Medications? Y Y  Comment - Caretaker assists.  Managing your Finances? Y Y  Comment - POA, carertaker assist when needed.  Housekeeping or managing your Housekeeping? Y Y  Comment - Caretaker assists.  Some recent data might be hidden    Patient Care Team: Crecencio Mc, MD as PCP - General (Internal Medicine)   Assessment:    This is a routine wellness examination for [name]. The goal of the wellness visit is to assist the patient how to close the gaps in care and create a  preventative care plan for the patient.   The roster of all physicians providing medical care to patient is listed in the Snapshot section of the chart.  Taking calcium VIT D as appropriate/Osteoporosis reviewed.    Safety issues reviewed; Smoke and carbon monoxide detectors in the home. No firearms in the home.  Wears seatbelts when riding with others. Patient does wear sunscreen or protective clothing when in direct sunlight. No violence in the home.  Depression- PHQ 2 &9 complete.  No signs/symptoms or verbal communication regarding little pleasure in doing things, feeling down, depressed or hopeless. No changes in sleeping, energy, eating, concentrating.  No thoughts of self harm or harm towards others.  Time spent on this topic is 8 minutes.   Patient is alert, normal appearance, oriented to person/place/and time.  Can read correct time from watch face.   No failures at ADL's or IADL's.  Ambulates with cane.   BMI- discussed the importance of a healthy diet, water intake and the benefits of aerobic exercise. Educational material provided.   24 hour diet recall: Breakfast: 1-2 eggs, toast Lunch: peanut butter sandwich Dinner: 2 Hotdog Snack:Ensure  Daily fluid intake: 1 cups of caffeine,  4 cups of water  Dental- every 6 months.  Eye- Visual acuity not assessed per patient preference since they have regular follow up with the ophthalmologist.  Wears corrective lenses.  Sleep patterns- Sleeps 8 hours at night.  Wakes feeling rested.  High dose influenza administered L deltoid, tolerated well. Educational material provided.  Patient Concerns: None at this time. Follow up with PCP as needed.  Exercise Activities and Dietary recommendations Current Exercise Habits: The patient does not participate in regular exercise at present  Goals    . Increase physical activity          Chair exercises as demonstrated.  Educational material provided.    . Increase water intake            Stay hydrated. Increase by 1 glass daily.      Fall Risk Fall Risk  05/11/2017 04/05/2017 10/11/2016 05/11/2016 05/04/2015  Falls in the past year? No No No No Yes  Number falls in past yr: - - - - 1  Injury with Fall? - - - - No  Risk for fall due to : - - - - -  Follow up - - - - Falls prevention discussed;Education provided   Depression Screen PHQ 2/9 Scores 05/11/2017 04/05/2017 10/11/2016 05/11/2016  PHQ - 2 Score 0 0 0 1  PHQ- 9 Score 0 - - 2    Cognitive Function MMSE - Mini Mental State Exam 05/11/2016 05/04/2015  Not completed: Unable to complete;Refused -  Orientation to time -  5  Orientation to Place - 5  Registration - 3  Attention/ Calculation - 5  Recall - 2  Language- name 2 objects - 2  Language- repeat - 1  Language- follow 3 step command - 3  Language- read & follow direction - 1  Write a sentence - 1  Copy design - 1  Total score - 29     6CIT Screen 05/11/2017  What Year? 0 points  What month? 0 points  What time? 0 points  Count back from 20 0 points  Months in reverse 0 points  Repeat phrase 2 points  Total Score 2    Immunization History  Administered Date(s) Administered  . Influenza Split 07/04/2013  . Influenza, High Dose Seasonal PF 08/12/2015, 05/11/2016, 05/11/2017  . Influenza,inj,Quad PF,6+ Mos 06/25/2014  . Pneumococcal Conjugate-13 07/28/2014  . Pneumococcal Polysaccharide-23 06/12/2013  . Zoster 08/01/2006   Screening Tests Health Maintenance  Topic Date Due  . TETANUS/TDAP  06/08/1945  . OPHTHALMOLOGY EXAM  05/14/2014  . HEMOGLOBIN A1C  01/26/2015  . FOOT EXAM  07/29/2015  . URINE MICROALBUMIN  12/22/2015  . INFLUENZA VACCINE  Completed  . PNA vac Low Risk Adult  Completed      Plan:    End of life planning; Advance aging; Advanced directives discussed. Copy of current HCPOA/Living Will requested.    I have personally reviewed and noted the following in the patient's chart:   . Medical and social history . Use of alcohol,  tobacco or illicit drugs  . Current medications and supplements . Functional ability and status . Nutritional status . Physical activity . Advanced directives . List of other physicians . Hospitalizations, surgeries, and ER visits in previous 12 months . Vitals . Screenings to include cognitive, depression, and falls . Referrals and appointments  In addition, I have reviewed and discussed with patient certain preventive protocols, quality metrics, and best practice recommendations. A written personalized care plan for preventive services as well as general preventive health recommendations were provided to patient.     Varney Biles, LPN  04/10/9291

## 2017-05-15 ENCOUNTER — Encounter (INDEPENDENT_AMBULATORY_CARE_PROVIDER_SITE_OTHER): Payer: Self-pay | Admitting: Vascular Surgery

## 2017-05-15 ENCOUNTER — Ambulatory Visit (INDEPENDENT_AMBULATORY_CARE_PROVIDER_SITE_OTHER): Payer: Medicare Other | Admitting: Vascular Surgery

## 2017-05-15 VITALS — BP 117/69 | HR 84 | Resp 16 | Wt 162.2 lb

## 2017-05-15 DIAGNOSIS — R600 Localized edema: Secondary | ICD-10-CM

## 2017-05-15 DIAGNOSIS — I6522 Occlusion and stenosis of left carotid artery: Secondary | ICD-10-CM

## 2017-05-15 DIAGNOSIS — I10 Essential (primary) hypertension: Secondary | ICD-10-CM

## 2017-05-15 DIAGNOSIS — I872 Venous insufficiency (chronic) (peripheral): Secondary | ICD-10-CM

## 2017-05-19 DIAGNOSIS — I872 Venous insufficiency (chronic) (peripheral): Secondary | ICD-10-CM | POA: Insufficient documentation

## 2017-05-19 NOTE — Progress Notes (Signed)
MRN : [id number]  Derrick Sullivan is a 83 y.o. (1924-07-16) male who presents with chief complaint of  Chief Complaint  Patient presents with  . Venous Insufficiency  .  History of Present Illness: The patient returns to the office for followup evaluation regarding leg swelling.  The swelling has improved quite a bit and the pain associated with swelling has decreased substantially. There have not been any interval development of a ulcerations or wounds.  Since the previous visit the patient has been wearing graduated compression stockings and has noted little significant improvement in the lymphedema. The patient has been using compression routinely morning until night.  The patient also states elevation during the day and exercise is being done too.     Current Meds  Medication Sig  . aspirin EC 81 MG tablet Take 81 mg by mouth daily. Reported on 02/11/2016  . atorvastatin (LIPITOR) 10 MG tablet TAKE ONE TABLET BY MOUTH EVERY DAY  . calcium carbonate (OSCAL) 1500 (600 Ca) MG TABS tablet Take 600 mg of elemental calcium by mouth 2 (two) times daily with a meal.  . citalopram (CELEXA) 20 MG tablet TAKE ONE TABLET BY MOUTH EVERY DAY  . docusate sodium (COLACE) 100 MG capsule Take 100 mg by mouth 2 (two) times daily.  . magnesium oxide (MAG-OX) 400 MG tablet Take 400 mg by mouth as needed (Constipation).  . memantine (NAMENDA) 5 MG tablet Take 5 mg by mouth 2 (two) times daily.  . Multiple Vitamins-Minerals (PRESERVISION AREDS 2) CAPS Take 1 capsule by mouth daily.  . polyethylene glycol (MIRALAX) packet Take 17 g by mouth daily.    Past Medical History:  Diagnosis Date  . Benign meningioma ([place]) 2006   s/p parietal resection  . Benign prostatic hypertrophy   . CAD (coronary artery disease)   . Carotid artery stenosis, asymptomatic 2008   hemodynamically insignificant  . Degenerative disk disease   . Dementia   . Diverticulosis of colon 2007  . Duodenal ulcer 07/2004   NSAID induced  . Hyperlipidemia   . Hypertension 09/26/2011  . Osteopenia   . Osteoporosis   . Peripheral vascular disease ([place])   . Presbyacusis     Past Surgical History:  Procedure Laterality Date  . BRAIN SURGERY     meningioma resection  . GALLBLADDER SURGERY  2000  . SHOULDER SURGERY  2000   rotator cuff    Social History Social History  Substance Use Topics  . Smoking status: Never Smoker  . Smokeless tobacco: Former Systems developer    Quit date: 07/05/1956  . Alcohol use No    Family History Family History  Problem Relation Age of Onset  . Diabetes Mother   . Heart disease Father     Allergies  Allergen Reactions  . Mirtazapine Rash     REVIEW OF SYSTEMS (Negative unless checked)  Constitutional: [] Weight loss  [] Fever  [] Chills Cardiac: [] Chest pain   [] Chest pressure   [] Palpitations   [] Shortness of breath when laying flat   [] Shortness of breath with exertion. Vascular:  [] Pain in legs with walking   [] Pain in legs at rest  [] History of DVT   [] Phlebitis   [x] Swelling in legs   [] Varicose veins   [] Non-healing ulcers Pulmonary:   [] Uses home oxygen   [] Productive cough   [] Hemoptysis   [] Wheeze  [] COPD   [] Asthma Neurologic:  [] Dizziness   [] Seizures   [] History of stroke   [] History of TIA  [] Aphasia   []   Vissual changes   [] Weakness or numbness in arm   [] Weakness or numbness in leg Musculoskeletal:   [] Joint swelling   [] Joint pain   [] Low back pain Hematologic:  [] Easy bruising  [] Easy bleeding   [] Hypercoagulable state   [] Anemic Gastrointestinal:  [] Diarrhea   [] Vomiting  [] Gastroesophageal reflux/heartburn   [] Difficulty swallowing. Genitourinary:  [] Chronic kidney disease   [] Difficult urination  [] Frequent urination   [] Blood in urine Skin:  [] Rashes   [] Ulcers  Psychological:  [] History of anxiety   []  History of major depression.  Physical Examination  Vitals:   05/15/17 1314  BP: 117/69  Pulse: 84  Resp: 16  Weight: 162 lb 3.2 oz (73.6 kg)    Body mass index is 24.66 kg/m. Gen: WD/WN, NAD Head: [place]/AT, No temporalis wasting.  Ear/Nose/Throat: Hearing grossly intact, nares w/o erythema or drainage Eyes: PER, EOMI, sclera nonicteric.  Neck: Supple, no large masses.   Pulmonary:  Good air movement, no audible wheezing bilaterally, no use of accessory muscles.  Cardiac: RRR, no JVD Vascular: Scattered varicosities present.  Mild venous stasis changes to the legs bilaterally.  2+ soft pitting edema Vessel Right Left  Radial Palpable Palpable  PT Palpable Palpable  DP Palpable Palpable  Gastrointestinal: Non-distended. No guarding/no peritoneal signs.  Musculoskeletal: M/S 5/5 throughout.  No deformity or atrophy.  Neurologic: CN 2-12 intact. Symmetrical.  Speech is fluent. Motor exam as listed above. Psychiatric: Judgment intact, Mood & affect appropriate for pt's clinical situation. Dermatologic: venous rashes no ulcers noted.  No changes consistent with cellulitis. Lymph : No lichenification or skin changes of chronic lymphedema.  CBC Lab Results  Component Value Date   WBC 8.0 01/20/2017   HGB 13.1 01/20/2017   HCT 38.6 (L) 01/20/2017   MCV 93.2 01/20/2017   PLT 163 01/20/2017    BMET    Component Value Date/Time   NA 137 01/20/2017 2016   NA 135 (L) 01/16/2014 1009   K 3.9 01/20/2017 2016   K 4.0 01/16/2014 1009   CL 103 01/20/2017 2016   CL 104 01/16/2014 1009   CO2 27 01/20/2017 2016   CO2 27 01/16/2014 1009   GLUCOSE 102 (H) 01/20/2017 2016   GLUCOSE 143 (H) 01/16/2014 1009   BUN 24 (H) 01/20/2017 2016   BUN 20 (H) 01/16/2014 1009   CREATININE 1.21 01/20/2017 2016   CREATININE 1.08 01/16/2014 1009   CALCIUM 9.7 01/20/2017 2016   CALCIUM 8.9 01/16/2014 1009   GFRNONAA 51 (L) 01/20/2017 2016   GFRNONAA >60 01/16/2014 1009   GFRAA 59 (L) 01/20/2017 2016   GFRAA >60 01/16/2014 1009   CrCl cannot be calculated (Patient's most recent lab result is older than the maximum 21 days allowed.).  COAG No  results found for: INR, PROTIME  Radiology No results found.  Assessment/Plan 1. Chronic venous insufficiency No surgery or intervention at this point in time.    I have had a long discussion with the patient regarding venous insufficiency and why it  causes symptoms. I have discussed with the patient the chronic skin changes that accompany venous insufficiency and the long term sequela such as infection and ulceration.  Patient will begin wearing graduated compression stockings class 1 (20-30 mmHg) or compression wraps on a daily basis a prescription was given. The patient will put the stockings on first thing in the morning and removing them in the evening. The patient is instructed specifically not to sleep in the stockings.    In addition, behavioral modification including  several periods of elevation of the lower extremities during the day will be continued. I have demonstrated that proper elevation is a position with the ankles at heart level.  The patient is instructed to begin routine exercise, especially walking on a daily basis  Following the review of the ultrasound the patient will follow up in 2-3 months to reassess the degree of swelling and the control that graduated compression stockings or compression wraps  is offering.   The patient can be assessed for a Lymph Pump at that time  2. Carotid stenosis, left Given his age and comorbidities I would only do a carotid intervention if he were to become symptomatic  I offered to follow his carotid annually but he has elected to follow up PRN  3. Essential hypertension Continue antihypertensive medications as already ordered, these medications have been reviewed and there are no changes at this time.   4. Bilateral lower extremity edema See #1    Hortencia Pilar, MD  05/19/2017 5:04 PM

## 2017-06-01 ENCOUNTER — Other Ambulatory Visit: Payer: Self-pay | Admitting: Internal Medicine

## 2017-08-22 ENCOUNTER — Telehealth: Payer: Self-pay

## 2017-08-22 NOTE — Telephone Encounter (Signed)
Please schedule with Dr.McLean on Thursday if they are willing to do so

## 2017-08-22 NOTE — Telephone Encounter (Signed)
Copied from [place]. Topic: Appointment Scheduling - Scheduling Inquiry for Clinic >> Aug 22, 2017 10:35 AM Antonieta Iba C wrote: Reason for CRM: Kenney Houseman, pt's home assistant called in to schedule pt an apt. She said that pt has lost a great deal of weight in a short amount of time, which has her concerned. She said that pt also is having some off and on stomach concerns and some pain. Not showing an opening with PCP. Showing opening with Scocuzza on Thursday, is this okay to schedule. Please advise.   CBKenney Houseman (cell) [phone]  Home - [phone]

## 2017-08-24 ENCOUNTER — Ambulatory Visit: Payer: Medicare Other | Admitting: Internal Medicine

## 2017-08-30 NOTE — Telephone Encounter (Signed)
Patient scheduled with Dr. Terese Door.

## 2017-08-31 ENCOUNTER — Ambulatory Visit: Payer: Medicare Other | Admitting: Internal Medicine

## 2017-08-31 ENCOUNTER — Encounter: Payer: Self-pay | Admitting: Internal Medicine

## 2017-08-31 VITALS — BP 124/74 | HR 89 | Temp 97.7°F | Resp 18 | Ht 68.0 in | Wt 159.1 lb

## 2017-08-31 DIAGNOSIS — R634 Abnormal weight loss: Secondary | ICD-10-CM

## 2017-08-31 DIAGNOSIS — R109 Unspecified abdominal pain: Secondary | ICD-10-CM

## 2017-08-31 LAB — CBC WITH DIFFERENTIAL/PLATELET
BASOS ABS: 0 10*3/uL (ref 0.0–0.1)
BASOS PCT: 0.7 % (ref 0.0–3.0)
Eosinophils Absolute: 0.5 10*3/uL (ref 0.0–0.7)
Eosinophils Relative: 7.4 % — ABNORMAL HIGH (ref 0.0–5.0)
HEMATOCRIT: 39.4 % (ref 39.0–52.0)
Hemoglobin: 13.5 g/dL (ref 13.0–17.0)
LYMPHS ABS: 1.1 10*3/uL (ref 0.7–4.0)
LYMPHS PCT: 17.2 % (ref 12.0–46.0)
MCHC: 34.2 g/dL (ref 30.0–36.0)
MCV: 95.8 fl (ref 78.0–100.0)
MONOS PCT: 8.8 % (ref 3.0–12.0)
Monocytes Absolute: 0.5 10*3/uL (ref 0.1–1.0)
NEUTROS ABS: 4 10*3/uL (ref 1.4–7.7)
NEUTROS PCT: 65.9 % (ref 43.0–77.0)
PLATELETS: 212 10*3/uL (ref 150.0–400.0)
RBC: 4.11 Mil/uL — ABNORMAL LOW (ref 4.22–5.81)
RDW: 13 % (ref 11.5–15.5)
WBC: 6.1 10*3/uL (ref 4.0–10.5)

## 2017-08-31 LAB — URINALYSIS, ROUTINE W REFLEX MICROSCOPIC
Bilirubin Urine: NEGATIVE
Hgb urine dipstick: NEGATIVE
Ketones, ur: NEGATIVE
Leukocytes, UA: NEGATIVE
Nitrite: NEGATIVE
RBC / HPF: NONE SEEN (ref 0–?)
Specific Gravity, Urine: 1.02 (ref 1.000–1.030)
Total Protein, Urine: NEGATIVE
Urine Glucose: NEGATIVE
Urobilinogen, UA: 0.2 (ref 0.0–1.0)
WBC, UA: NONE SEEN (ref 0–?)
pH: 6.5 (ref 5.0–8.0)

## 2017-08-31 LAB — COMPREHENSIVE METABOLIC PANEL
ALT: 9 U/L (ref 0–53)
AST: 14 U/L (ref 0–37)
Albumin: 4 g/dL (ref 3.5–5.2)
Alkaline Phosphatase: 80 U/L (ref 39–117)
BILIRUBIN TOTAL: 0.7 mg/dL (ref 0.2–1.2)
BUN: 29 mg/dL — ABNORMAL HIGH (ref 6–23)
CHLORIDE: 105 meq/L (ref 96–112)
CO2: 30 meq/L (ref 19–32)
Calcium: 9.4 mg/dL (ref 8.4–10.5)
Creatinine, Ser: 1.18 mg/dL (ref 0.40–1.50)
GFR: 61.47 mL/min (ref >60.00)
GLUCOSE: 125 mg/dL — AB (ref 70–99)
Potassium: 4.1 mEq/L (ref 3.5–5.1)
Sodium: 140 mEq/L (ref 135–145)
Total Protein: 6.7 g/dL (ref 6.0–8.3)

## 2017-08-31 NOTE — Patient Instructions (Signed)
Please f/u in 2 weeks  Labs today  We will order CT abdomen/pelvis and also CT chest   Abdominal Pain, Adult Many things can cause belly (abdominal) pain. Most times, belly pain is not dangerous. Many cases of belly pain can be watched and treated at home. Sometimes belly pain is serious, though. Your doctor will try to find the cause of your belly pain. Follow these instructions at home:  Take over-the-counter and prescription medicines only as told by your doctor. Do not take medicines that help you poop (laxatives) unless told to by your doctor.  Drink enough fluid to keep your pee (urine) clear or pale yellow.  Watch your belly pain for any changes.  Keep all follow-up visits as told by your doctor. This is important. Contact a doctor if:  Your belly pain changes or gets worse.  You are not hungry, or you lose weight without trying.  You are having trouble pooping (constipated) or have watery poop (diarrhea) for more than 2-3 days.  You have pain when you pee or poop.  Your belly pain wakes you up at night.  Your pain gets worse with meals, after eating, or with certain foods.  You are throwing up and cannot keep anything down.  You have a fever. Get help right away if:  Your pain does not go away as soon as your doctor says it should.  You cannot stop throwing up.  Your pain is only in areas of your belly, such as the right side or the left lower part of the belly.  You have bloody or black poop, or poop that looks like tar.  You have very bad pain, cramping, or bloating in your belly.  You have signs of not having enough fluid or water in your body (dehydration), such as: ? Dark pee, very little pee, or no pee. ? Cracked lips. ? Dry mouth. ? Sunken eyes. ? Sleepiness. ? Weakness. This information is not intended to replace advice given to you by your health care provider. Make sure you discuss any questions you have with your health care provider. Document  Released: 02/08/2008 Document Revised: 03/11/2016 Document Reviewed: 02/03/2016 Elsevier Interactive Patient Education  2018 [employer].  Failure to Thrive, Adult Failure to thrive is a group of problems. These problems include eating too little and losing weight. People who have this condition may do fewer and fewer activities over time. They may lose interest in being with friends or they may not want to eat or drink. Follow these instructions at home:  Take medicines only as told by your doctor.  Eat a healthy, well-balanced diet. Make sure that you eat enough.  Be active. Do strength training. A physical therapist can help to set up an exercise program that fits you.  Make sure that you are safe at home.  Make sure that you have a plan for what to do if you cannot make decisions for yourself. Contact a doctor if:  You are not able to eat well.  You are not able to move around.  You feel very sad.  You feel very hopeless. Get help right away if:  You think about ending your life.  You cannot eat or drink.  You do not get out of bed.  Staying at home is not safe.  You have a fever. This information is not intended to replace advice given to you by your health care provider. Make sure you discuss any questions you have with your health  care provider. Document Released: 08/11/2011 Document Revised: 01/28/2016 Document Reviewed: 11/17/2014 Elsevier Interactive Patient Education  [employer].

## 2017-08-31 NOTE — Progress Notes (Signed)
Chief Complaint  Patient presents with  . Abdominal Pain  . Weight Loss   F/u with caretaker who provides most of history and is c/w intermittent unspecified location of abdominal pain after eating and weight loss. He is drinking supplemental shakes but wt down from 167 04/05/17 to 159.2 today. Abdominal complaints started 2-3 weeks ago to the point the patient was about to cry. He is having regular stools w/o red/black color change. He is on laxative qd for constipation. He denies pain with urination but c/o strong odor which is new and he does have h/o of UTI per chart review.   Caretaker is concerned and wants pt to get checked. Pt unfortunately does not recall many of the details as caretaker states he has h/o dementia.   Review of Systems  Constitutional: Positive for weight loss. Negative for fever.  HENT: Positive for hearing loss.   Respiratory: Negative for shortness of breath.   Cardiovascular: Negative for chest pain.  Gastrointestinal: Negative for abdominal pain, blood in stool and constipation.       H/o abdominal pain but none today   Genitourinary: Negative for dysuria.       +strong odor   Musculoskeletal:       +walks with cane   Skin: Negative for rash.  Neurological: Negative for headaches.  Psychiatric/Behavioral: Positive for memory loss.   Past Medical History:  Diagnosis Date  . Benign meningioma ([place]) 2006   s/p parietal resection  . Benign prostatic hypertrophy   . CAD (coronary artery disease)   . Carotid artery stenosis, asymptomatic 2008   hemodynamically insignificant  . Degenerative disk disease   . Dementia   . Diverticulosis of colon 2007  . Duodenal ulcer 07/2004   NSAID induced  . Hyperlipidemia   . Hypertension 09/26/2011  . Osteopenia   . Osteoporosis   . Peripheral vascular disease ([place])   . Presbyacusis    Past Surgical History:  Procedure Laterality Date  . BRAIN SURGERY     meningioma resection  . GALLBLADDER SURGERY  2000  .  SHOULDER SURGERY  2000   rotator cuff   Family History  Problem Relation Age of Onset  . Diabetes Mother   . Heart disease Father    Social History   Socioeconomic History  . Marital status: Married    Spouse name: Not on file  . Number of children: Not on file  . Years of education: 74  . Highest education level: Not on file  Social Needs  . Financial resource strain: Not on file  . Food insecurity - worry: Not on file  . Food insecurity - inability: Not on file  . Transportation needs - medical: Not on file  . Transportation needs - non-medical: Not on file  Occupational History    Employer: RETIRED  Tobacco Use  . Smoking status: Never Smoker  . Smokeless tobacco: Former Network engineer and Sexual Activity  . Alcohol use: No  . Drug use: No  . Sexual activity: No  Other Topics Concern  . Not on file  Social History Narrative   As of 09/01/27 wife is 84 and they have been married 64 years live at home with caretakers.    Current Meds  Medication Sig  . aspirin EC 81 MG tablet Take 81 mg by mouth daily. Reported on 02/11/2016  . atorvastatin (LIPITOR) 10 MG tablet TAKE ONE TABLET BY MOUTH EVERY DAY  . calcium carbonate (OSCAL) 1500 (600 Ca) MG TABS tablet  Take 600 mg of elemental calcium by mouth 2 (two) times daily with a meal.  . citalopram (CELEXA) 20 MG tablet TAKE ONE TABLET BY MOUTH EVERY DAY  . docusate sodium (COLACE) 100 MG capsule Take 100 mg by mouth 2 (two) times daily.  . magnesium oxide (MAG-OX) 400 MG tablet Take 400 mg by mouth as needed (Constipation).  . memantine (NAMENDA) 5 MG tablet Take 5 mg by mouth 2 (two) times daily.  . Multiple Vitamins-Minerals (PRESERVISION AREDS 2) CAPS Take 1 capsule by mouth daily.  . polyethylene glycol (MIRALAX) packet Take 17 g by mouth daily.   Allergies  Allergen Reactions  . Mirtazapine Rash   No results found for this or any previous visit (from the past 2160 hour(s)). Objective  Body mass index is 24.19  kg/m. Wt Readings from Last 3 Encounters:  08/31/17 159 lb 2 oz (72.2 kg)  05/15/17 162 lb 3.2 oz (73.6 kg)  05/11/17 163 lb 12.8 oz (74.3 kg)   Temp Readings from Last 3 Encounters:  08/31/17 97.7 F (36.5 C) (Oral)  05/11/17 98.3 F (36.8 C) (Oral)  04/25/17 98.6 F (37 C) (Oral)   BP Readings from Last 3 Encounters:  08/31/17 124/74  05/15/17 117/69  05/11/17 130/70   Pulse Readings from Last 3 Encounters:  08/31/17 89  05/15/17 84  05/11/17 72   O2 sat room air 97%   Physical Exam  Constitutional: He is oriented to person, place, and time and well-developed, well-nourished, and in no distress. Vital signs are normal.  HENT:  Head: Normocephalic and atraumatic.  Mouth/Throat: Oropharynx is clear and moist and mucous membranes are normal.  Eyes: Conjunctivae are normal. Pupils are equal, round, and reactive to light.  Cardiovascular: Normal rate, regular rhythm and normal heart sounds.  No murmur heard. Pulmonary/Chest: Effort normal and breath sounds normal.  Abdominal: Soft. Bowel sounds are normal. There is no tenderness.  No ab pain on exam today   Neurological: He is alert and oriented to person, place, and time. Gait normal.  BL walks with cane   Skin: Skin is warm, dry and intact.  Psychiatric: Mood, memory, affect and judgment normal.  Nursing note and vitals reviewed.   Assessment   1. Unintentional weight loss  2. Abdominal pain unspecified location ddx chronic mesenteric ischemia with h/o PVD, constipation, UTI, vs other   Plan  1. And 2.  Will w/u with CT chest w/o contrast, CTA ab/pelvis w/ and w/o contrast (given h/o PVD to w/u for chronic mesenteric ischemia disc'ed with radiology today of note CT 02/01/16 with diverticulosis, right inguinal hernia w/o obstruction, renal cysts b/l kidney scarring b/o, kidney calcification 7 mm, atherosclerotic changes of aorta and branch vessels), CMET, CBC, TSH, T4, UA and urine culture   rec pt increase hydration  with water  F/u in 2 weeks after CT scans to disc tx plan   Provider: Dr. Olivia Mackie McLean-[facility]-Internal Medicine

## 2017-09-01 LAB — TSH: TSH: 1.78 u[IU]/mL (ref 0.35–4.50)

## 2017-09-01 LAB — URINE CULTURE
MICRO NUMBER:: 81452383
RESULT: NO GROWTH
SPECIMEN QUALITY:: ADEQUATE

## 2017-09-01 LAB — T4, FREE: Free T4: 0.73 ng/dL (ref 0.60–1.60)

## 2017-09-04 ENCOUNTER — Other Ambulatory Visit: Payer: Self-pay | Admitting: Internal Medicine

## 2017-09-04 DIAGNOSIS — R634 Abnormal weight loss: Secondary | ICD-10-CM

## 2017-09-14 ENCOUNTER — Ambulatory Visit
Admission: RE | Admit: 2017-09-14 | Discharge: 2017-09-14 | Disposition: A | Discharge status: Home / Self Care | Payer: Medicare Other | Source: Ambulatory Visit | Attending: Internal Medicine | Admitting: Internal Medicine

## 2017-09-14 DIAGNOSIS — R109 Unspecified abdominal pain: Secondary | ICD-10-CM | POA: Diagnosis present

## 2017-09-14 DIAGNOSIS — I700 Atherosclerosis of aorta: Secondary | ICD-10-CM | POA: Diagnosis not present

## 2017-09-14 DIAGNOSIS — N200 Calculus of kidney: Secondary | ICD-10-CM | POA: Diagnosis not present

## 2017-09-14 DIAGNOSIS — R634 Abnormal weight loss: Secondary | ICD-10-CM | POA: Diagnosis present

## 2017-09-14 DIAGNOSIS — K4090 Unilateral inguinal hernia, without obstruction or gangrene, not specified as recurrent: Secondary | ICD-10-CM | POA: Insufficient documentation

## 2017-09-14 DIAGNOSIS — I2510 Atherosclerotic heart disease of native coronary artery without angina pectoris: Secondary | ICD-10-CM | POA: Diagnosis not present

## 2017-09-14 MED ORDER — IOPAMIDOL (ISOVUE-370) INJECTION 76%
100.0000 mL | Freq: Once | INTRAVENOUS | Status: AC | PRN
  Administered 2017-09-14: 100 mL via INTRAVENOUS

## 2017-09-26 ENCOUNTER — Other Ambulatory Visit: Payer: Self-pay | Admitting: Internal Medicine

## 2017-09-26 DIAGNOSIS — K551 Chronic vascular disorders of intestine: Secondary | ICD-10-CM

## 2017-09-27 ENCOUNTER — Other Ambulatory Visit: Payer: Self-pay | Admitting: Internal Medicine

## 2017-10-05 ENCOUNTER — Ambulatory Visit (INDEPENDENT_AMBULATORY_CARE_PROVIDER_SITE_OTHER): Payer: Medicare Other | Admitting: Vascular Surgery

## 2017-10-05 ENCOUNTER — Encounter (INDEPENDENT_AMBULATORY_CARE_PROVIDER_SITE_OTHER): Payer: Self-pay | Admitting: Vascular Surgery

## 2017-10-05 VITALS — BP 112/71 | HR 77 | Resp 16 | Ht 70.0 in | Wt 153.0 lb

## 2017-10-05 DIAGNOSIS — I872 Venous insufficiency (chronic) (peripheral): Secondary | ICD-10-CM | POA: Diagnosis not present

## 2017-10-05 DIAGNOSIS — F0281 Dementia in other diseases classified elsewhere with behavioral disturbance: Secondary | ICD-10-CM | POA: Diagnosis not present

## 2017-10-05 DIAGNOSIS — R109 Unspecified abdominal pain: Secondary | ICD-10-CM | POA: Diagnosis not present

## 2017-10-05 DIAGNOSIS — E1151 Type 2 diabetes mellitus with diabetic peripheral angiopathy without gangrene: Secondary | ICD-10-CM | POA: Diagnosis not present

## 2017-10-05 DIAGNOSIS — G309 Alzheimer's disease, unspecified: Secondary | ICD-10-CM | POA: Diagnosis not present

## 2017-10-05 DIAGNOSIS — I10 Essential (primary) hypertension: Secondary | ICD-10-CM | POA: Diagnosis not present

## 2017-10-08 ENCOUNTER — Encounter (INDEPENDENT_AMBULATORY_CARE_PROVIDER_SITE_OTHER): Payer: Self-pay | Admitting: Vascular Surgery

## 2017-10-08 DIAGNOSIS — R109 Unspecified abdominal pain: Secondary | ICD-10-CM | POA: Insufficient documentation

## 2017-10-08 NOTE — Progress Notes (Signed)
MRN : [id number]  Derrick Sullivan is a 84 y.o. ([birth year]) male who presents with chief complaint of  Chief Complaint  Patient presents with  . Follow-up    Mesenteric ischemia  .  History of Present Illness: I am asked to evaluate the patient for the complaint of abdominal pain with uncertain etiology.  The patient is followed by his primary who is concerned about mesenteric ischemia.  The patient has noted some weight loss about 7 pounds since September.  He denies nausea.  The patient denies  food fear,which is confirmed by the Aid that is with him.  He states it seems to occur intermittently and frequently wakes hm up at night. The patient denies bloody bowel movements or diarrhea.   No history of peptic ulcer disease.   No prior peripheral angiograms or vascular interventions.  The patient denies amaurosis fugax or recent TIA symptoms. There are no recent neurological changes noted. The patient denies claudication symptoms or rest pain symptoms. The patient denies history of DVT, PE or superficial thrombophlebitis. The patient denies recent episodes of angina     Current Meds  Medication Sig  . aspirin 81 MG tablet Take by mouth.  Marland Kitchen atorvastatin (LIPITOR) 10 MG tablet TAKE ONE TABLET BY MOUTH EVERY DAY  . calcium carbonate (OSCAL) 1500 (600 Ca) MG TABS tablet Take 600 mg of elemental calcium by mouth 2 (two) times daily with a meal.  . citalopram (CELEXA) 20 MG tablet TAKE ONE TABLET BY MOUTH EVERY DAY  . docusate sodium (COLACE) 100 MG capsule Take 100 mg by mouth 2 (two) times daily.  Marland Kitchen donepezil (ARICEPT) 10 MG tablet   . enalapril (VASOTEC) 2.5 MG tablet Take by mouth.  . memantine (NAMENDA) 5 MG tablet Take 5 mg by mouth 2 (two) times daily.  . Multiple Vitamin (MULTIVITAMIN) capsule Take by mouth.  . Multiple Vitamins-Minerals (PRESERVISION AREDS 2) CAPS Take 1 capsule by mouth daily.  . polyethylene glycol (MIRALAX) packet Take 17 g by mouth daily.    Past Medical  History:  Diagnosis Date  . Benign meningioma ([place]) 2006   s/p parietal resection  . Benign prostatic hypertrophy   . CAD (coronary artery disease)   . Carotid artery stenosis, asymptomatic 2008   hemodynamically insignificant  . Degenerative disk disease   . Dementia   . Diverticulosis of colon 2007  . Duodenal ulcer 07/2004   NSAID induced  . Hyperlipidemia   . Hypertension 09/26/2011  . Osteopenia   . Osteoporosis   . Peripheral vascular disease ([place])   . Presbyacusis     Past Surgical History:  Procedure Laterality Date  . BRAIN SURGERY     meningioma resection  . GALLBLADDER SURGERY  2000  . SHOULDER SURGERY  2000   rotator cuff    Social History Social History   Tobacco Use  . Smoking status: Never Smoker  . Smokeless tobacco: Former Network engineer Use Topics  . Alcohol use: No  . Drug use: No    Family History Family History  Problem Relation Age of Onset  . Diabetes Mother   . Heart disease Father     Allergies  Allergen Reactions  . Mirtazapine Rash     REVIEW OF SYSTEMS (Negative unless checked)  Constitutional: [] Weight loss  [] Fever  [] Chills Cardiac: [] Chest pain   [] Chest pressure   [] Palpitations   [] Shortness of breath when laying flat   [] Shortness of breath with exertion. Vascular:  [] Pain in legs  with walking   [] Pain in legs at rest  [] History of DVT   [] Phlebitis   [] Swelling in legs   [] Varicose veins   [] Non-healing ulcers Pulmonary:   [] Uses home oxygen   [] Productive cough   [] Hemoptysis   [] Wheeze  [] COPD   [] Asthma Neurologic:  [] Dizziness   [] Seizures   [] History of stroke   [] History of TIA  [] Aphasia   [] Vissual changes   [] Weakness or numbness in arm   [] Weakness or numbness in leg Musculoskeletal:   [] Joint swelling   [] Joint pain   [] Low back pain Hematologic:  [] Easy bruising  [] Easy bleeding   [] Hypercoagulable state   [] Anemic Gastrointestinal:  [] Diarrhea   [] Vomiting  [] Gastroesophageal reflux/heartburn    [] Difficulty swallowing. Genitourinary:  [] Chronic kidney disease   [] Difficult urination  [] Frequent urination   [] Blood in urine Skin:  [] Rashes   [] Ulcers  Psychological:  [] History of anxiety   []  History of major depression.  Physical Examination  Vitals:   10/05/17 1018  BP: 112/71  Pulse: 77  Resp: 16  Weight: 153 lb (69.4 kg)  Height: 5\' 10"  (1.778 m)   Body mass index is 21.95 kg/m. Gen: WD/WN, NAD Head: [place]/AT, No temporalis wasting.  Ear/Nose/Throat: Hearing grossly intact, nares w/o erythema or drainage Eyes: PER, EOMI, sclera nonicteric.  Neck: Supple, no large masses.   Pulmonary:  Good air movement, no audible wheezing bilaterally, no use of accessory muscles.  Cardiac: RRR, no JVD Vascular: carotid bruits Vessel Right Left  Radial Palpable Palpable  Ulnar Palpable Palpable  Brachial Palpable Palpable  Carotid Palpable Palpable  Gastrointestinal: Non-distended. No guarding/no peritoneal signs.  Musculoskeletal: M/S 5/5 throughout.  No deformity or atrophy.  Neurologic: CN 2-12 intact. Symmetrical.  Speech is fluent. Motor exam as listed above. Psychiatric: Judgment intact, Mood & affect appropriate for pt's clinical situation. Dermatologic: No rashes or ulcers noted.  No changes consistent with cellulitis. Lymph : No lichenification or skin changes of chronic lymphedema.  CBC Lab Results  Component Value Date   WBC 6.1 08/31/2017   HGB 13.5 08/31/2017   HCT 39.4 08/31/2017   MCV 95.8 08/31/2017   PLT 212.0 08/31/2017    BMET    Component Value Date/Time   NA 140 08/31/2017 1422   NA 135 (L) 01/16/2014 1009   K 4.1 08/31/2017 1422   K 4.0 01/16/2014 1009   CL 105 08/31/2017 1422   CL 104 01/16/2014 1009   CO2 30 08/31/2017 1422   CO2 27 01/16/2014 1009   GLUCOSE 125 (H) 08/31/2017 1422   GLUCOSE 143 (H) 01/16/2014 1009   BUN 29 (H) 08/31/2017 1422   BUN 20 (H) 01/16/2014 1009   CREATININE 1.18 08/31/2017 1422   CREATININE 1.08 01/16/2014  1009   CALCIUM 9.4 08/31/2017 1422   CALCIUM 8.9 01/16/2014 1009   GFRNONAA 51 (L) 01/20/2017 2016   GFRNONAA >60 01/16/2014 1009   GFRAA 59 (L) 01/20/2017 2016   GFRAA >60 01/16/2014 1009   CrCl cannot be calculated (Patient's most recent lab result is older than the maximum 21 days allowed.).  COAG No results found for: INR, PROTIME  Radiology Ct Chest W Contrast  Result Date: 09/14/2017 CLINICAL DATA:  Unintentional recent weight loss. Generalized abdominal pain. History of atherosclerosis. EXAM: CT CHEST WITH CONTRAST CT ANGIOGRAPHY OF THE ABDOMEN AND PELVIS WITH CONTRAST TECHNIQUE: Multidetector CT imaging through the chest, abdomen and pelvis was performed using the standard protocol during bolus administration of intravenous contrast. Multiplanar reconstructed images and  MIPs were obtained and reviewed to evaluate the vascular anatomy. CONTRAST:  138mL ISOVUE-370 IOPAMIDOL (ISOVUE-370) INJECTION 76% COMPARISON:  CT of the abdomen and pelvis on 02/01/2016 FINDINGS: CT CHEST FINDINGS Cardiovascular: The thoracic aorta demonstrates atherosclerosis with calcified plaque. No evidence of aneurysmal disease or dissection. Coronary atherosclerosis also present with calcified plaque in the distribution of the LAD and left circumflex coronary arteries. The heart size is normal. No pericardial fluid. Central pulmonary arteries are normal in caliber. Mediastinum/Nodes: No enlarged mediastinal, hilar, or axillary lymph nodes. Thyroid gland, trachea, and esophagus demonstrate no significant findings. Lungs/Pleura: Mild scarring present at both lung bases. There is no evidence of pulmonary edema, consolidation, pneumothorax, nodule or pleural fluid. Musculoskeletal: Degenerative disease of the thoracic spine and osteopenia. No bony lesions or fractures identified. CTA ABDOMEN AND PELVIS FINDINGS VASCULAR Aorta: The abdominal aorta demonstrates atherosclerosis with diffuse calcified and noncalcified plaque  present. No evidence of aortic aneurysm or stenosis. No penetrating ulcer disease. Celiac: Mild atherosclerotic stenosis at the origin of the celiac axis present with approximately 30-40% narrowing at the celiac origin. Distal branch vessels show normal patency and branching anatomy. SMA: Eccentric calcified plaque along the superior aspect of the origin of the superior mesenteric artery causes approximately 40-50% stenosis. Distal branches are normally patent. Renals: There are 2 separate right and 2 separate left renal arteries with both sides demonstrating small accessory upper pole arteries. No significant renal artery stenosis identified. IMA: The inferior mesenteric artery is open and shows no significant disease. Inflow: Bilateral common, internal and external iliac arteries show scattered plaque without significant stenosis or aneurysm. Bilateral common femoral arteries show mild plaque without stenosis. The femoral bifurcations show mild disease bilaterally without significant stenosis of SFA or profunda origins. Veins: Venous phase imaging was also performed demonstrating no evidence of venous abnormalities involving systemic veins, the portal vein, mesenteric veins or the splenic vein. Review of the MIP images confirms the above findings. NON-VASCULAR Hepatobiliary: No focal liver abnormality is seen. Status post cholecystectomy. No biliary dilatation. Pancreas: Unremarkable. No pancreatic ductal dilatation or surrounding inflammatory changes. Spleen: Normal in size without focal abnormality. Adrenals/Urinary Tract: Adrenal glands are unremarkable. Cluster of 2 adjacent nonobstructing calculi in the lower pole of the right kidney measure approximately 8 mm in greatest diameter. Probable component of parapelvic cysts in both central kidneys without evidence of significant hydronephrosis. The bladder appears unremarkable. Stomach/Bowel: Bowel shows no evidence of obstruction or inflammation. Focal segment  of small bowel enters a right inguinal hernia without evidence by CT of incarceration. No free air. Lymphatic: No enlarged lymph nodes identified in the abdomen or pelvis. Reproductive: The prostate gland appears mildly enlarged and demonstrates central calcifications. Other: Right inguinal hernia present containing a loop of small bowel. No evidence by CT of incarceration, fluid collection or inflammation in the hernia sac. Musculoskeletal: Diffuse degenerative disease of the lumbar spine with large anterior osteophytes. No fractures or bony lesions identified. Review of the MIP images confirms the above findings. IMPRESSION: 1. No evidence of malignancy in the chest. 2. Coronary atherosclerosis with calcified plaque in the distribution of the LAD and left circumflex coronary arteries. 3. Abdominal aortic atherosclerosis without evidence of aneurysm. 4. Mild celiac origin stenosis does not appear significant (30-40%). Stenosis at the origin of the SMA is estimated to be 40-50%. The IMA is open. These findings likely do not indicate enough compromise to cause significant mesenteric ischemia. 5. Nonobstructing calculi in the lower pole of the right kidney. 6. Right inguinal hernia  containing a loop of small bowel, similar to the prior CT in 2017. No evidence by CT to suggest incarcerated hernia. Electronically Signed   By: Aletta Edouard M.D.   On: 09/14/2017 20:53   Ct Angio Abdomen Pelvis  W &/or Wo Contrast  Result Date: 09/14/2017 CLINICAL DATA:  Unintentional recent weight loss. Generalized abdominal pain. History of atherosclerosis. EXAM: CT CHEST WITH CONTRAST CT ANGIOGRAPHY OF THE ABDOMEN AND PELVIS WITH CONTRAST TECHNIQUE: Multidetector CT imaging through the chest, abdomen and pelvis was performed using the standard protocol during bolus administration of intravenous contrast. Multiplanar reconstructed images and MIPs were obtained and reviewed to evaluate the vascular anatomy. CONTRAST:  115mL  ISOVUE-370 IOPAMIDOL (ISOVUE-370) INJECTION 76% COMPARISON:  CT of the abdomen and pelvis on 02/01/2016 FINDINGS: CT CHEST FINDINGS Cardiovascular: The thoracic aorta demonstrates atherosclerosis with calcified plaque. No evidence of aneurysmal disease or dissection. Coronary atherosclerosis also present with calcified plaque in the distribution of the LAD and left circumflex coronary arteries. The heart size is normal. No pericardial fluid. Central pulmonary arteries are normal in caliber. Mediastinum/Nodes: No enlarged mediastinal, hilar, or axillary lymph nodes. Thyroid gland, trachea, and esophagus demonstrate no significant findings. Lungs/Pleura: Mild scarring present at both lung bases. There is no evidence of pulmonary edema, consolidation, pneumothorax, nodule or pleural fluid. Musculoskeletal: Degenerative disease of the thoracic spine and osteopenia. No bony lesions or fractures identified. CTA ABDOMEN AND PELVIS FINDINGS VASCULAR Aorta: The abdominal aorta demonstrates atherosclerosis with diffuse calcified and noncalcified plaque present. No evidence of aortic aneurysm or stenosis. No penetrating ulcer disease. Celiac: Mild atherosclerotic stenosis at the origin of the celiac axis present with approximately 30-40% narrowing at the celiac origin. Distal branch vessels show normal patency and branching anatomy. SMA: Eccentric calcified plaque along the superior aspect of the origin of the superior mesenteric artery causes approximately 40-50% stenosis. Distal branches are normally patent. Renals: There are 2 separate right and 2 separate left renal arteries with both sides demonstrating small accessory upper pole arteries. No significant renal artery stenosis identified. IMA: The inferior mesenteric artery is open and shows no significant disease. Inflow: Bilateral common, internal and external iliac arteries show scattered plaque without significant stenosis or aneurysm. Bilateral common femoral arteries  show mild plaque without stenosis. The femoral bifurcations show mild disease bilaterally without significant stenosis of SFA or profunda origins. Veins: Venous phase imaging was also performed demonstrating no evidence of venous abnormalities involving systemic veins, the portal vein, mesenteric veins or the splenic vein. Review of the MIP images confirms the above findings. NON-VASCULAR Hepatobiliary: No focal liver abnormality is seen. Status post cholecystectomy. No biliary dilatation. Pancreas: Unremarkable. No pancreatic ductal dilatation or surrounding inflammatory changes. Spleen: Normal in size without focal abnormality. Adrenals/Urinary Tract: Adrenal glands are unremarkable. Cluster of 2 adjacent nonobstructing calculi in the lower pole of the right kidney measure approximately 8 mm in greatest diameter. Probable component of parapelvic cysts in both central kidneys without evidence of significant hydronephrosis. The bladder appears unremarkable. Stomach/Bowel: Bowel shows no evidence of obstruction or inflammation. Focal segment of small bowel enters a right inguinal hernia without evidence by CT of incarceration. No free air. Lymphatic: No enlarged lymph nodes identified in the abdomen or pelvis. Reproductive: The prostate gland appears mildly enlarged and demonstrates central calcifications. Other: Right inguinal hernia present containing a loop of small bowel. No evidence by CT of incarceration, fluid collection or inflammation in the hernia sac. Musculoskeletal: Diffuse degenerative disease of the lumbar spine with large anterior osteophytes.  No fractures or bony lesions identified. Review of the MIP images confirms the above findings. IMPRESSION: 1. No evidence of malignancy in the chest. 2. Coronary atherosclerosis with calcified plaque in the distribution of the LAD and left circumflex coronary arteries. 3. Abdominal aortic atherosclerosis without evidence of aneurysm. 4. Mild celiac origin  stenosis does not appear significant (30-40%). Stenosis at the origin of the SMA is estimated to be 40-50%. The IMA is open. These findings likely do not indicate enough compromise to cause significant mesenteric ischemia. 5. Nonobstructing calculi in the lower pole of the right kidney. 6. Right inguinal hernia containing a loop of small bowel, similar to the prior CT in 2017. No evidence by CT to suggest incarcerated hernia. Electronically Signed   By: Aletta Edouard M.D.   On: 09/14/2017 20:53    Assessment/Plan 1. Abdominal pain, unspecified abdominal location Although he does have some weight loss, the pattern of pain that he is describing does not fit with mesenteric ischemia.  I am suspicious that his weight loss is from lack of eating secondary to dementia and possibly depression.  Similar to his carotid stenosis any intervention in a person his age carries a higher risk for significant complications and so given the intermittent pain that seems some what random I would not recommend angiogram at this time.  I would have him follow up PRN or if his symptoms worsen  2. Chronic venous insufficiency No surgery or intervention at this point in time.    I have had a long discussion with the patient regarding venous insufficiency and why it  causes symptoms. I have discussed with the patient the chronic skin changes that accompany venous insufficiency and the long term sequela such as infection and ulceration.  Patient will begin wearing graduated compression stockings class 1 (20-30 mmHg) or compression wraps on a daily basis a prescription was given. The patient will put the stockings on first thing in the morning and removing them in the evening. The patient is instructed specifically not to sleep in the stockings.    In addition, behavioral modification including several periods of elevation of the lower extremities during the day will be continued. I have demonstrated that proper elevation is  a position with the ankles at heart level.  The patient is instructed to begin routine exercise, especially walking on a daily basis  The patient will follow up PRN to reassess the degree of swelling and the control that graduated compression stockings or compression wraps  is offering.   The patient can be assessed for a Lymph Pump at that time  3. Diabetes mellitus with peripheral artery disease ([place]) Continue hypoglycemic medications as already ordered, these medications have been reviewed and there are no changes at this time.  Hgb A1C to be monitored as already arranged by primary service   4. Essential hypertension Continue antihypertensive medications as already ordered, these medications have been reviewed and there are no changes at this time.   5. Alzheimer's dementia with behavioral disturbance, unspecified timing of dementia onset Continue to supportive care.    Hortencia Pilar, MD  10/08/2017 9:33 AM

## 2017-10-19 ENCOUNTER — Ambulatory Visit (INDEPENDENT_AMBULATORY_CARE_PROVIDER_SITE_OTHER): Payer: Medicare Other | Admitting: Gastroenterology

## 2017-10-19 ENCOUNTER — Encounter: Payer: Self-pay | Admitting: Gastroenterology

## 2017-10-19 VITALS — BP 93/54 | HR 78 | Temp 97.8°F | Ht 70.0 in | Wt 154.6 lb

## 2017-10-19 DIAGNOSIS — R1013 Epigastric pain: Secondary | ICD-10-CM

## 2017-10-19 DIAGNOSIS — G8929 Other chronic pain: Secondary | ICD-10-CM

## 2017-10-19 MED ORDER — RANITIDINE HCL 150 MG PO CAPS: 150.0000 mg | ORAL_CAPSULE | Freq: Two times a day (BID) | ORAL | 3 refills | Status: DC

## 2017-10-19 NOTE — Progress Notes (Signed)
Jonathon Bellows MD, MRCP(U.K) [address]  [place], [place] [address]  Main: [phone]  Fax: [phone]   Gastroenterology Consultation  Referring Provider:     McLean-Scocuzza, Olivia Mackie * Primary Care Physician:  Crecencio Mc, MD Primary Gastroenterologist:  Dr. Jonathon Bellows  Reason for Consultation:     Mesenteric ischemia         HPI:   Derrick Sullivan is a 84 y.o. y/o male referred for consultation & management  by Dr. Derrel Nip, Aris Everts, MD.     He is a patient who follows with Dr Delana Meyer for abdominal pain , seen recently by him and felt the pain was not consistent with mesenteric ischemia . It was felt that his weight loss was due to poor food intake from dementia and possible depression. Surgical intervention was felt to be high risk.    !/2019 : Ct chest showed mild celiac origin stenosis , SMA stenosis not significant .No other acute findings in the abdomen.     Labs 08/2017 : Hb 13.5 grams, elevated eosinophil count. 7.4. CMP normal    Abdominal pain: Onset: First episode was in 2017 , since then he has had < 4 episodes. Comes and goes. The last episode was 2 weeks back , the pain lasted 10 minutes. The patient does not recall these episodes. The nurse accompanying him said the pain was in the center , lower abdomen , after meals, <3 hours after meals. Unclear of the nature of the pain. Lost some weight . Normally weighs 166 lbs  , today 154 lbs .    NSAID use: none  PPI use :none  Gall bladder surgery: unsure  Frequency of bowel movements: take a laxative daily- has a few bowel movements a day .    Past Medical History:  Diagnosis Date  . Benign meningioma ([facility]) 2006   s/p parietal resection  . Benign prostatic hypertrophy   . CAD (coronary artery disease)   . Carotid artery stenosis, asymptomatic 2008   hemodynamically insignificant  . Degenerative disk disease   . Dementia   . Diverticulosis of colon 2007  . Duodenal ulcer 07/2004   NSAID induced  . Hyperlipidemia   . Hypertension 09/26/2011  . Osteopenia   . Osteoporosis   . Peripheral vascular disease ([facility])   . Presbyacusis     Past Surgical History:  Procedure Laterality Date  . BRAIN SURGERY     meningioma resection  . GALLBLADDER SURGERY  2000  . SHOULDER SURGERY  2000   rotator cuff    Prior to Admission medications   Medication Sig Start Date End Date Taking? Authorizing Provider  aspirin 81 MG tablet Take by mouth. 10/19/10   [provider]  atorvastatin (LIPITOR) 10 MG tablet TAKE ONE TABLET BY MOUTH EVERY DAY 06/01/17   Crecencio Mc, MD  calcium carbonate (OSCAL) 1500 (600 Ca) MG TABS tablet Take 600 mg of elemental calcium by mouth 2 (two) times daily with a meal.    [provider]  citalopram (CELEXA) 20 MG tablet TAKE ONE TABLET BY MOUTH EVERY DAY 09/28/17   Crecencio Mc, MD  docusate sodium (COLACE) 100 MG capsule Take 100 mg by mouth 2 (two) times daily.    [provider]  donepezil (ARICEPT) 10 MG tablet  09/27/17   [provider]  enalapril (VASOTEC) 2.5 MG tablet Take by mouth.    [provider]  magnesium oxide (MAG-OX) 400 MG tablet Take  400 mg by mouth as needed (Constipation).    [provider]  memantine (NAMENDA) 10 MG tablet  10/18/17   [provider]  memantine (NAMENDA) 5 MG tablet Take 5 mg by mouth 2 (two) times daily.    [provider]  Multiple Vitamin (MULTIVITAMIN) capsule Take by mouth.    [provider]  Multiple Vitamins-Minerals (PRESERVISION AREDS 2) CAPS Take 1 capsule by mouth daily.    [provider]  polyethylene glycol (MIRALAX) packet Take 17 g by mouth daily. 01/20/17   Schuyler Amor, MD  pantoprazole (PROTONIX) 40 MG tablet Take 40 mg by mouth daily.    11/18/11  [provider]    Family History  Problem Relation Age of Onset  . Diabetes Mother   . Heart disease Father     BP (!) 93/54 (BP Location:  Left Arm, Patient Position: Sitting, Cuff Size: Normal)   Pulse 78   Temp 97.8 F (36.6 C) (Oral)   Ht 5\' 10"  (1.778 m)   Wt 154 lb 9.6 oz (70.1 kg)   BMI 22.18 kg/m   Social History   Tobacco Use  . Smoking status: Never Smoker  . Smokeless tobacco: Former Network engineer Use Topics  . Alcohol use: No  . Drug use: No    Allergies as of 10/19/2017 - Review Complete 10/08/2017  Allergen Reaction Noted  . Mirtazapine Rash 04/28/2014    Review of Systems:    All systems reviewed and negative except where noted in HPI.   Physical Exam:  There were no vitals taken for this visit. No LMP for male patient. Psych:  Alert and cooperative. Normal mood and affect. General:   Alert,  Well-developed, well-nourished, pleasant and cooperative in NAD Head:  Normocephalic and atraumatic. Eyes:  Sclera clear, no icterus.   Conjunctiva pink. Ears:  Normal auditory acuity. Nose:  No deformity, discharge, or lesions. Mouth:  No deformity or lesions,oropharynx pink & moist. Neck:  Supple; no masses or thyromegaly. Lungs:  Respirations even and unlabored.  Clear throughout to auscultation.   No wheezes, crackles, or rhonchi. No acute distress. Heart:  Regular rate and rhythm; no murmurs, clicks, rubs, or gallops. Abdomen:  Normal bowel sounds.  No bruits.  Soft, non-tender and non-distended without masses, hepatosplenomegaly or hernias noted.  No guarding or rebound tenderness.    eurologic:  Alert and oriented x3;  grossly normal neurologically. Skin:  Intact without significant lesions or rashes. No jaundice. Lymph Nodes:  No significant cervical adenopathy. Psych:  Alert and cooperative. Normal mood and affect.  Imaging Studies: No results found.  Assessment and Plan:   Derrick Sullivan is a 84 y.o. y/o male has been referred for abdominal pain. 4 episodes of pain since 2017 . Suggest a trial of Zantac 150 mg BID, stool H pylori antigen and if pain recurs then can discuss if keen on  further evaluation.  He could no recollect any episodes of pain and history was from the nurse who accompanied him.   Follow up PRN  Dr Jonathon Bellows MD,MRCP(U.K)

## 2017-10-23 ENCOUNTER — Telehealth: Payer: Self-pay | Admitting: *Deleted

## 2017-10-23 NOTE — Telephone Encounter (Signed)
Copied from [place] [phone]. Topic: General - Other >> Oct 23, 2017  8:34 AM Synthia Innocent wrote: Need a letter stating that Mr Felicetti in not competent for his affairs.

## 2017-10-24 NOTE — Telephone Encounter (Signed)
Looks like from the contact information Harkirat Orozco [phone],

## 2017-10-24 NOTE — Telephone Encounter (Signed)
Who is requesting the letter?

## 2017-10-24 NOTE — Telephone Encounter (Signed)
Please advise 

## 2017-10-24 NOTE — Telephone Encounter (Signed)
Are you aware of who is requesting the letter?

## 2017-10-25 NOTE — Telephone Encounter (Signed)
Spoke with Derrick Sullivan who is pt's cousin and also POA. He stated that he is needing a letter typed up stating that Derrick Sullivan is no longer capable of taking care of his own affairs so he can give it to the pt's banker. Derrick Sullivan stated that the pt has started having really bad memory issues and is losing very important paperwork, paying bills to the wrong people, forgetting to pay bills, and forgetting to transfer funds so that checks don't bounce.

## 2017-10-25 NOTE — Telephone Encounter (Signed)
Letter on printer

## 2017-10-26 ENCOUNTER — Other Ambulatory Visit: Payer: Self-pay | Admitting: Gastroenterology

## 2017-10-27 NOTE — Telephone Encounter (Signed)
Spoke with Derrick Sullivan pt's POA to let him know that the letter has been completed and placed up front for pick up.

## 2017-10-30 LAB — H. PYLORI ANTIGEN, STOOL: H PYLORI AG STL: NEGATIVE

## 2017-11-01 ENCOUNTER — Encounter: Payer: Self-pay | Admitting: Gastroenterology

## 2017-11-08 ENCOUNTER — Telehealth: Payer: Self-pay | Admitting: Internal Medicine

## 2017-11-08 NOTE — Telephone Encounter (Signed)
Left detailed message for [facility].

## 2017-11-08 NOTE — Telephone Encounter (Signed)
I will agree to hospice services

## 2017-11-08 NOTE — Telephone Encounter (Unsigned)
Copied from [place] [phone]. Topic: Quick Communication - See Telephone Encounter >> Nov 08, 2017 10:56 AM Percell Belt A wrote: CRM for notification. See Telephone encounter for: [facility] called in and Langley Gauss) and stated that the pt family has called them and requested there services.  They are faxing over orders if DR tullo agrees  Phone number [phone]   11/08/17.

## 2017-11-09 ENCOUNTER — Telehealth: Payer: Self-pay | Admitting: Internal Medicine

## 2017-11-09 NOTE — Telephone Encounter (Signed)
Please advise 

## 2017-11-09 NOTE — Telephone Encounter (Signed)
Hospice referral form completed and returned to [facility] in red folder

## 2017-11-09 NOTE — Telephone Encounter (Signed)
Denise from [facility] asked that the form be faxed back along with a facesheet, insurance info, and notes from any recent office visits to: [phone]

## 2017-11-10 NOTE — Telephone Encounter (Signed)
Faxed order with other document requested.

## 2017-11-14 ENCOUNTER — Encounter: Payer: Self-pay | Admitting: Family Medicine

## 2017-11-14 ENCOUNTER — Ambulatory Visit (INDEPENDENT_AMBULATORY_CARE_PROVIDER_SITE_OTHER): Payer: Medicare Other | Admitting: Family Medicine

## 2017-11-14 ENCOUNTER — Telehealth: Payer: Self-pay | Admitting: Internal Medicine

## 2017-11-14 ENCOUNTER — Telehealth: Payer: Self-pay | Admitting: *Deleted

## 2017-11-14 VITALS — BP 144/74 | HR 80 | Temp 97.6°F | Wt 153.2 lb

## 2017-11-14 DIAGNOSIS — L603 Nail dystrophy: Secondary | ICD-10-CM

## 2017-11-14 NOTE — Telephone Encounter (Signed)
FYI

## 2017-11-14 NOTE — Telephone Encounter (Signed)
Copied from [place]. Topic: Quick Communication - See Telephone Encounter >> Nov 14, 2017  9:23 AM Lolita Rieger, RMA wrote: CRM for notification. See Telephone encounter for:   11/14/17.Denise from [facility] called and stated that after evaluation pt is not hospice appropriate at this time [phone]

## 2017-11-14 NOTE — Patient Instructions (Signed)
If toe nail causes pain, swelling, or redness, please follow up with foot doctor for evaluation and treatment.  Advise seeking care at your foot doctor when trimming nails.

## 2017-11-14 NOTE — Telephone Encounter (Signed)
Copied from [place]. Topic: Quick Communication - See Telephone Encounter >> Nov 14, 2017  9:23 AM Lolita Rieger, RMA wrote: CRM for notification. See Telephone encounter for:   11/14/17.Denise from [facility] called and stated that after evaluation pt is not hospice appropriate at this time [phone]

## 2017-11-14 NOTE — Progress Notes (Signed)
Subjective:    Patient ID: Derrick Sullivan, male    DOB: 03-06-1924, 84 y.o.   MRN: [id number]  HPI  Derrick Sullivan is a 84 year old male who presents today with thickening of his toenail on right great toe. Nail is discolored/yellow. Associated mild erythema has been present previously but is improved today. He does not know how long this has been present; he states "quite some time" He is accompanied by his aide and she reports that this has been present for at least two months.  He denies trauma, fever, chills, sweats, N/V/D, erythema, or swelling. No pain with walking or wear shoes. Reports feeling well. No treatments have been tried. No aggravating/alleviating factors noted.   He has a hit this history of carotid stenosis, HTN, DM with peripheral artery disease, Mitral insufficiency and chronic venous insufficiency. He has also late onset Alzheimer's disease and neuropathy of foot  Review of Systems  Constitutional: Negative for chills, fatigue and fever.  Respiratory: Negative for cough and wheezing.   Cardiovascular: Negative for chest pain, palpitations and leg swelling.  Gastrointestinal: Negative for abdominal pain.  Musculoskeletal:       Toe nail thickening  Skin: Negative for rash.   Past Medical History:  Diagnosis Date  . Benign meningioma ([facility]) 2006   s/p parietal resection  . Benign prostatic hypertrophy   . CAD (coronary artery disease)   . Carotid artery stenosis, asymptomatic 2008   hemodynamically insignificant  . Degenerative disk disease   . Dementia   . Diverticulosis of colon 2007  . Duodenal ulcer 07/2004   NSAID induced  . Hyperlipidemia   . Hypertension 09/26/2011  . Osteopenia   . Osteoporosis   . Peripheral vascular disease ([facility])   . Presbyacusis      Social History   Socioeconomic History  . Marital status: Married    Spouse name: Not on file  . Number of children: Not on file  . Years of education: 65  . Highest education level: Not on  file  Social Needs  . Financial resource strain: Not on file  . Food insecurity - worry: Not on file  . Food insecurity - inability: Not on file  . Transportation needs - medical: Not on file  . Transportation needs - non-medical: Not on file  Occupational History    Employer: RETIRED  Tobacco Use  . Smoking status: Never Smoker  . Smokeless tobacco: Former Network engineer and Sexual Activity  . Alcohol use: No  . Drug use: No  . Sexual activity: No  Other Topics Concern  . Not on file  Social History Narrative   As of 09/01/27 wife is 18 and they have been married 80 years live at home with caretakers.     Past Surgical History:  Procedure Laterality Date  . BRAIN SURGERY     meningioma resection  . GALLBLADDER SURGERY  2000  . SHOULDER SURGERY  2000   rotator cuff    Family History  Problem Relation Age of Onset  . Diabetes Mother   . Heart disease Father     Allergies  Allergen Reactions  . Mirtazapine Rash    Current Outpatient Medications on File Prior to Visit  Medication Sig Dispense Refill  . aspirin 81 MG tablet Take by mouth.    Marland Kitchen atorvastatin (LIPITOR) 10 MG tablet TAKE ONE TABLET BY MOUTH EVERY DAY 90 tablet 3  . calcium carbonate (OSCAL) 1500 (600 Ca) MG TABS tablet Take 600  mg of elemental calcium by mouth 2 (two) times daily with a meal.    . citalopram (CELEXA) 20 MG tablet TAKE ONE TABLET BY MOUTH EVERY DAY 90 tablet 1  . docusate sodium (COLACE) 100 MG capsule Take 100 mg by mouth 2 (two) times daily.    Marland Kitchen donepezil (ARICEPT) 10 MG tablet     . enalapril (VASOTEC) 2.5 MG tablet Take by mouth.    . memantine (NAMENDA) 5 MG tablet Take 5 mg by mouth 2 (two) times daily.    . Multiple Vitamin (MULTIVITAMIN) capsule Take by mouth.    . Multiple Vitamins-Minerals (PRESERVISION AREDS 2) CAPS Take 1 capsule by mouth daily.    . polyethylene glycol (MIRALAX) packet Take 17 g by mouth daily. 14 each 0  . ranitidine (ZANTAC) 150 MG capsule Take 1 capsule  (150 mg total) by mouth 2 (two) times daily. 60 capsule 3  . [DISCONTINUED] pantoprazole (PROTONIX) 40 MG tablet Take 40 mg by mouth daily.       No current facility-administered medications on file prior to visit.     BP (!) 144/74 (BP Location: Left Arm, Patient Position: Sitting, Cuff Size: Normal)   Pulse 80   Temp 97.6 F (36.4 C) (Oral)   Wt 153 lb 3.2 oz (69.5 kg)   BMI 21.98 kg/m       Objective:   Physical Exam  Constitutional: He is oriented to person, place, and time. He appears well-developed and well-nourished.  Eyes: Pupils are equal, round, and reactive to light. No scleral icterus.  Cardiovascular: Normal rate, regular rhythm and intact distal pulses.  Pulses:      Dorsalis pedis pulses are 2+ on the right side, and 2+ on the left side.       Posterior tibial pulses are 2+ on the right side, and 2+ on the left side.  Pulmonary/Chest: Effort normal and breath sounds normal. He has no wheezes. He has no rales.  Musculoskeletal: He exhibits no edema.  Thickening and yellow discoloration of toe nails present. Great toe nail of left foot appears slightly raised but remains attached to nailbed. No erythema, warmth, or edema noted. No pain with palpation. No sensory deficit found on foot exam.   Neurological: He is alert and oriented to person, place, and time.  Skin: Skin is warm and dry. No rash noted.      Assessment & Plan:  1. Nail dystrophy Exam and history are reassuring. No signs of infection or ingrown toenail found today. Changes in nail color/thickening are most likely related to chronic conditions and aging; fungal etiology can also be considered. He denies any discomfort today. We discussed options of appropriate shoe wear, examining feet, and seeing a podiatrist for further evaluation and nail trimming. He agreed to see podiatry for trimming and evaluation of nail changes.  Advised follow up if he develops any pain, erythema, edema, or fever.  Delano Metz, FNP-C

## 2017-12-04 ENCOUNTER — Encounter: Payer: Self-pay | Admitting: Podiatry

## 2017-12-04 ENCOUNTER — Ambulatory Visit (INDEPENDENT_AMBULATORY_CARE_PROVIDER_SITE_OTHER): Payer: Medicare Other | Admitting: Podiatry

## 2017-12-04 VITALS — BP 158/82 | HR 78

## 2017-12-04 DIAGNOSIS — W450XXA Nail entering through skin, initial encounter: Secondary | ICD-10-CM

## 2017-12-04 DIAGNOSIS — T148XXA Other injury of unspecified body region, initial encounter: Secondary | ICD-10-CM

## 2017-12-04 DIAGNOSIS — E1159 Type 2 diabetes mellitus with other circulatory complications: Secondary | ICD-10-CM | POA: Diagnosis not present

## 2017-12-04 NOTE — Progress Notes (Signed)
This patient presents to the office with chief complaint of a painful thickened right big toenail.  Patient states that this nail has been painful and lifting off and its attachment for over 2 months.  This patient has been diagnosed with Alzheimer's and diabetes with PAD.  He resents to the office with a home care companion.  He says he has no history of remember any injury to the right big toenail.  He has provided no self treatment nor sought any professional help.  He presents the office today for an evaluation and treatment of this right big toenail.   General Appearance  Alert, conversant and in no acute stress. Patient does have Alzheimer.  Vascular  Dorsalis pedis and posterior tibial  pulses are weakly  palpable  bilaterally.  Capillary return is within normal limits  bilaterally. Temperature is within normal limits  bilaterally.  Neurologic  Senn-Weinstein monofilament wire test within normal limits  bilaterally. Muscle power within normal limits bilaterally.  Nails Thick disfigured discolored nails with subungual debris  from hallux to fifth toes bilaterally. The proximal nail fold area reveals the proximal aspect of the nail has lifted.  No evidence of any redness, swelling or drainage.  Orthopedic  No limitations of motion of motion feet .  No crepitus or effusions noted.  No bony pathology or digital deformities noted.  Skin  normotropic skin with no porokeratosis noted bilaterally.  No signs of infections or ulcers noted.   Hematoma right hallux nail  Nail injury right hallux.   IE  Debridement of nail including the unattached nail plate at the proximal nail fold right hallux.  Neosporin/DSD.  Home soak instructions were given.  RTC 3 months for preventative foot care services.   Gardiner Barefoot DPM

## 2017-12-17 ENCOUNTER — Encounter: Payer: Self-pay | Admitting: Emergency Medicine

## 2017-12-17 ENCOUNTER — Emergency Department
Admission: EM | Admit: 2017-12-17 | Discharge: 2017-12-17 | Disposition: A | Discharge status: Home / Self Care | Payer: Medicare Other | Attending: Emergency Medicine | Admitting: Emergency Medicine

## 2017-12-17 ENCOUNTER — Emergency Department: Payer: Medicare Other

## 2017-12-17 DIAGNOSIS — E119 Type 2 diabetes mellitus without complications: Secondary | ICD-10-CM | POA: Diagnosis not present

## 2017-12-17 DIAGNOSIS — E860 Dehydration: Secondary | ICD-10-CM | POA: Diagnosis not present

## 2017-12-17 DIAGNOSIS — I2510 Atherosclerotic heart disease of native coronary artery without angina pectoris: Secondary | ICD-10-CM | POA: Diagnosis not present

## 2017-12-17 DIAGNOSIS — N390 Urinary tract infection, site not specified: Secondary | ICD-10-CM

## 2017-12-17 DIAGNOSIS — G301 Alzheimer's disease with late onset: Secondary | ICD-10-CM | POA: Insufficient documentation

## 2017-12-17 DIAGNOSIS — R42 Dizziness and giddiness: Secondary | ICD-10-CM | POA: Insufficient documentation

## 2017-12-17 DIAGNOSIS — I10 Essential (primary) hypertension: Secondary | ICD-10-CM | POA: Diagnosis not present

## 2017-12-17 LAB — URINALYSIS, COMPLETE (UACMP) WITH MICROSCOPIC
Bilirubin Urine: NEGATIVE
Glucose, UA: NEGATIVE mg/dL
Hgb urine dipstick: NEGATIVE
Ketones, ur: NEGATIVE mg/dL
Nitrite: NEGATIVE
PROTEIN: NEGATIVE mg/dL
Specific Gravity, Urine: 1.018 (ref 1.005–1.030)
pH: 6 (ref 5.0–8.0)

## 2017-12-17 LAB — BASIC METABOLIC PANEL
ANION GAP: 6 (ref 5–15)
BUN: 27 mg/dL — ABNORMAL HIGH (ref 6–20)
CALCIUM: 9.4 mg/dL (ref 8.9–10.3)
CO2: 28 mmol/L (ref 22–32)
Chloride: 104 mmol/L (ref 101–111)
Creatinine, Ser: 1.07 mg/dL (ref 0.61–1.24)
GFR calc Af Amer: >60 mL/min (ref >60)
GFR, EST NON AFRICAN AMERICAN: 59 mL/min — AB (ref >60)
Glucose, Bld: 116 mg/dL — ABNORMAL HIGH (ref 65–99)
Potassium: 4.2 mmol/L (ref 3.5–5.1)
SODIUM: 138 mmol/L (ref 135–145)

## 2017-12-17 LAB — CBC
HCT: 37.9 % — ABNORMAL LOW (ref 40.0–52.0)
HEMOGLOBIN: 13 g/dL (ref 13.0–18.0)
MCH: 32.7 pg (ref 26.0–34.0)
MCHC: 34.3 g/dL (ref 32.0–36.0)
MCV: 95.4 fL (ref 80.0–100.0)
Platelets: 168 10*3/uL (ref 150–440)
RBC: 3.98 MIL/uL — AB (ref 4.40–5.90)
RDW: 13.9 % (ref 11.5–14.5)
WBC: 5.6 10*3/uL (ref 3.8–10.6)

## 2017-12-17 LAB — TROPONIN I
TROPONIN I: 0.04 ng/mL — AB (ref <0.03)
TROPONIN I: 0.04 ng/mL — AB (ref <0.03)

## 2017-12-17 MED ORDER — SODIUM CHLORIDE 0.9 % IV SOLN
Freq: Once | INTRAVENOUS | Status: AC
  Administered 2017-12-17: 19:00:00 via INTRAVENOUS

## 2017-12-17 MED ORDER — CEPHALEXIN 250 MG PO CAPS: 250.0000 mg | ORAL_CAPSULE | Freq: Four times a day (QID) | ORAL | 0 refills | Status: AC

## 2017-12-17 MED ORDER — SODIUM CHLORIDE 0.9 % IV SOLN
INTRAVENOUS | Status: AC
  Filled 2017-12-17: qty 10

## 2017-12-17 MED ORDER — SODIUM CHLORIDE 0.9 % IV SOLN
1.0000 g | Freq: Once | INTRAVENOUS | Status: AC
Start: 2017-12-17 — End: 2017-12-17
  Administered 2017-12-17: 1 g via INTRAVENOUS

## 2017-12-17 NOTE — ED Notes (Signed)
Patient transported to CT 

## 2017-12-17 NOTE — ED Provider Notes (Signed)
[facility] Emergency Department Provider Note       Time seen: ----------------------------------------- 6:02 PM on 12/17/2017 ----------------------------------------- Level V caveat: History/ROS limited by altered mental status  I have reviewed the triage vital signs and the nursing notes.  HISTORY   Chief Complaint Dizziness    HPI Derrick Sullivan is a 84 y.o. male with a history of coronary artery disease, dementia, hyperlipidemia, hypertension and peripheral vascular disease who presents to the ED for dizziness that was occurring earlier today.  He is a difficult historian, EMS was called to the house earlier today after he got dizzy and almost fell.  Family states he gets this way when he has a UTI.  No further information is available.  Past Medical History:  Diagnosis Date  . Benign meningioma ([place]) 2006   s/p parietal resection  . Benign prostatic hypertrophy   . CAD (coronary artery disease)   . Carotid artery stenosis, asymptomatic 2008   hemodynamically insignificant  . Degenerative disk disease   . Dementia   . Diverticulosis of colon 2007  . Duodenal ulcer 07/2004   NSAID induced  . Hyperlipidemia   . Hypertension 09/26/2011  . Osteopenia   . Osteoporosis   . Peripheral vascular disease ([place])   . Presbyacusis     Patient Active Problem List   Diagnosis Date Noted  . Abdominal pain 10/08/2017  . Chronic venous insufficiency 05/19/2017  . Urinary frequency 03/01/2016  . Late onset Alzheimer's disease without behavioral disturbance 01/13/2016  . UTI (urinary tract infection) 12/02/2015  . Abdominal pain, epigastric 10/31/2015  . Do not resuscitate discussion 05/13/2015  . Bilateral lower extremity edema 12/23/2014  . Autonomic dysfunction 10/15/2014  . Unsteady gait 05/13/2013  . Functional constipation 10/02/2012  . Mitral insufficiency 05/11/2012  . Other and unspecified hyperlipidemia 03/27/2012  . Neuropathy of foot  11/27/2011  . Duodenal ulcer due to nonsteroidal anti-inflammatory drug (NSAID) 09/26/2011  . Hypertension 09/26/2011  . Diabetes mellitus with peripheral artery disease ([place]) 09/26/2011  . Alzheimer's dementia with behavioral disturbance 08/23/2011  . Benign prostatic hypertrophy   . Benign meningioma ([place])   . Osteopenia   . Carotid stenosis, left   . Diverticulosis of colon     Past Surgical History:  Procedure Laterality Date  . BRAIN SURGERY     meningioma resection  . GALLBLADDER SURGERY  2000  . SHOULDER SURGERY  2000   rotator cuff    Allergies Mirtazapine  Social History Social History   Tobacco Use  . Smoking status: Never Smoker  . Smokeless tobacco: Former Network engineer Use Topics  . Alcohol use: No  . Drug use: No   Review of Systems Reported dizziness with possible chest pain reported earlier  All systems negative/normal/unremarkable except as stated in the HPI  ____________________________________________   PHYSICAL EXAM:  VITAL SIGNS: ED Triage Vitals  Enc Vitals Group     BP 12/17/17 1717 122/69     Pulse Rate 12/17/17 1717 81     Resp 12/17/17 1717 18     Temp 12/17/17 1717 98 F (36.7 C)     Temp Source 12/17/17 1717 Oral     SpO2 12/17/17 1717 99 %     Weight 12/17/17 1717 145 lb (65.8 kg)     Height 12/17/17 1717 5\' 10"  (1.778 m)     Head Circumference --      Peak Flow --      Pain Score 12/17/17 1723 0  Pain Loc --      Pain Edu? --      Excl. in [place]? --    Constitutional: Alert but disoriented.  No distress Eyes: Conjunctivae are normal. Normal extraocular movements. ENT   Head: Normocephalic, right infraorbital ecchymosis   Nose: No congestion/rhinnorhea.   Mouth/Throat: Mucous membranes are moist.   Neck: No stridor. Cardiovascular: Normal rate, regular rhythm. No murmurs, rubs, or gallops. Respiratory: Normal respiratory effort without tachypnea nor retractions. Breath sounds are clear and equal  bilaterally. No wheezes/rales/rhonchi. Gastrointestinal: Soft and nontender. Normal bowel sounds Musculoskeletal: Nontender with normal range of motion in extremities. No lower extremity tenderness nor edema. Neurologic:  Normal speech and language. No gross focal neurologic deficits are appreciated.  Skin:  Skin is warm, dry and intact. No rash noted. Psychiatric: Mood and affect are normal. Speech and behavior are normal.  ____________________________________________  EKG: Interpreted by me.  This rhythm with sinus arrhythmia, rate is 76 bpm, normal PR interval, normal QRS, normal QT  ____________________________________________  ED COURSE:  As part of my medical decision making, I reviewed the following data within the [place] History obtained from family if available, nursing notes, old chart and ekg, as well as notes from prior ED visits. Patient presented for dizziness, we will assess with labs and imaging as indicated at this time.   Procedures ____________________________________________   LABS (pertinent positives/negatives)  Labs Reviewed  BASIC METABOLIC PANEL - Abnormal; Notable for the following components:      Result Value   Glucose, Bld 116 (*)    BUN 27 (*)    GFR calc non Af Amer 59 (*)    All other components within normal limits  CBC - Abnormal; Notable for the following components:   RBC 3.98 (*)    HCT 37.9 (*)    All other components within normal limits  URINALYSIS, COMPLETE (UACMP) WITH MICROSCOPIC - Abnormal; Notable for the following components:   Color, Urine YELLOW (*)    APPearance CLOUDY (*)    Leukocytes, UA TRACE (*)    Bacteria, UA RARE (*)    Squamous Epithelial / LPF 0-5 (*)    All other components within normal limits  TROPONIN I - Abnormal; Notable for the following components:   Troponin I 0.04 (*)    All other components within normal limits  TROPONIN I - Abnormal; Notable for the following components:   Troponin  I 0.04 (*)    All other components within normal limits    RADIOLOGY Images were viewed by me  CT head, pelvis x-rays IMPRESSION: Prior posterior RIGHT parietal craniotomy and old infarct.  No acute intracranial abnormalities. IMPRESSION: Calcified granulomas on the right. No edema or consolidation. There is aortic atherosclerosis. Old healed rib fractures on the right.  Aortic Atherosclerosis (ICD10-I70.0). IMPRESSION: No fracture or dislocation. Symmetric narrowing both hip joints, stable. Generalized osteoporosis. Multiple foci of arterial vascular atherosclerosis.  ____________________________________________  DIFFERENTIAL DIAGNOSIS   Dehydration, electrolyte abnormality, occult infection, subdural hematoma, MI  FINAL ASSESSMENT AND PLAN  Dizziness, urinary tract infection, mild dehydration   Plan: The patient had presented for dizziness and did appear to be somewhat dehydrated based on his labs with a mild urinary tract infection. Patient's imaging did not reveal any acute process.  He was given IV Rocephin as well as a liter of fluids.  He will be discharged with Keflex and is stable for outpatient follow-up.  We did trend his troponins which were not increasing and  persistently elevated from prior.   Laurence Aly, MD   Note: This note was generated in part or whole with voice recognition software. Voice recognition is usually quite accurate but there are transcription errors that can and very often do occur. I apologize for any typographical errors that were not detected and corrected.     Earleen Newport, MD 12/17/17 2017

## 2017-12-17 NOTE — ED Triage Notes (Signed)
Pt comes into the ED via POV c/o dizziness that was occurring earlier today.  Patient also had EMS come to his house earlier today for chest pain, but the patient states he is no longer having it.  The HPO states that he sometimes will get this when he has a UTI.  Patient denies any pain at this time.  Patient has h/o dementia.

## 2017-12-17 NOTE — ED Notes (Signed)
Date and time results received: 12/17/17 1844 (use smartphrase ".now" to insert current time)  Test: troponin Critical Value: 0.04  Name of Provider Notified: williams

## 2017-12-17 NOTE — ED Notes (Signed)
Pt denies any problems. Family reports a care giver called this morning and he was having chest pain that is now gone.  Family present.  Caregivers also told family he has been sleeping later and later each morning.  Has hx of UTI. Acting at baseline mental status.  Has not been eating and drinking as much.

## 2018-01-10 ENCOUNTER — Other Ambulatory Visit: Payer: Self-pay | Admitting: Internal Medicine

## 2018-01-11 NOTE — Telephone Encounter (Signed)
Refilled: 02/01/2016 Last OV: 08/31/2017 Next OV: not scheduled

## 2018-02-13 ENCOUNTER — Other Ambulatory Visit: Payer: Self-pay | Admitting: Gastroenterology

## 2018-02-14 NOTE — Progress Notes (Signed)
Subjective:    Patient ID: Derrick Sullivan, male    DOB: Mar 18, 1924, 84 y.o.   MRN: [id number]  HPI  Derrick Sullivan is a 84 year old male who presents today with his caretaker for evaluation of weight loss. He has been evaluated previously for abnormal weight loss 08/31/17. During this work up, CT chest found plaque/CAD, lung scarring, and degenerative changes in mid and lower spine. CT abdomen revealed plaque build up in abdomen, stomach arteries, leg arteries;  Kidney stones that were non obstructing; mildly enlarged prostate and inguinal hernia but no blockage. He has a history of intermittent abdominal pain after eating where he has been seen by GI. GI evaluated noted that weight loss is most likely consistent with poor intake which was felt to be r/t dementia and/or depression and less likely associated with chronic mesenteric ischemia. He was started on Zantac 150 mg BID and stool H. Pylori was negative.  BMs have been soft and formed. He takes a daily laxative. No evidence of blood in stool, black or thin caliber stool, or change in color History of UTIs present and he was treated 12/17/17 with Keflex and was provided fluids due to dehydration. No increase in frequency or dysuria reported by caretaker. He received an evaluation for Hospice on 11/14/17 and was found that after evaluation, he did not qualify for hospice services at that time.  Diet consists of breakfast of eggs/bacon/biscuit, he also drinks at least one ensure a day. He also eats a dinner meal that can include mashed potatoes, macaroni and cheese, and 1/2 pork chop.  His caretaker reports that he will "forget to eat" if he is not reminded. He reports that he does not drink enough water and his caretaker also reports that he does not drink water but will drink diet coke. He does not drink a whole bottle a day.  He also takes a daily multivitamin.   Wt Readings from Last 3 Encounters:  02/15/18 145 lb (65.8 kg)  12/17/17 145 lb  (65.8 kg)  11/14/17 153 lb 3.2 oz (69.5 kg)    Review of Systems  Constitutional: Negative for chills, fatigue and fever.       Weight loss  HENT: Positive for hearing loss.   Respiratory: Negative for cough, shortness of breath and wheezing.   Cardiovascular: Negative for chest pain and palpitations.  Gastrointestinal: Negative for abdominal pain, blood in stool, nausea and vomiting.  Skin: Negative for rash.  Psychiatric/Behavioral:       History of dementia   Past Medical History:  Diagnosis Date  . Benign meningioma ([place]) 2006   s/p parietal resection  . Benign prostatic hypertrophy   . CAD (coronary artery disease)   . Carotid artery stenosis, asymptomatic 2008   hemodynamically insignificant  . Degenerative disk disease   . Dementia   . Diverticulosis of colon 2007  . Duodenal ulcer 07/2004   NSAID induced  . Hyperlipidemia   . Hypertension 09/26/2011  . Osteopenia   . Osteoporosis   . Peripheral vascular disease ([place])   . Presbyacusis      Social History   Socioeconomic History  . Marital status: Married    Spouse name: Not on file  . Number of children: Not on file  . Years of education: 77  . Highest education level: Not on file  Occupational History    Employer: RETIRED  Social Needs  . Financial resource strain: Not on file  . Food insecurity:  Worry: Not on file    Inability: Not on file  . Transportation needs:    Medical: Not on file    Non-medical: Not on file  Tobacco Use  . Smoking status: Never Smoker  . Smokeless tobacco: Former Network engineer and Sexual Activity  . Alcohol use: No  . Drug use: No  . Sexual activity: Never  Lifestyle  . Physical activity:    Days per week: Not on file    Minutes per session: Not on file  . Stress: Not on file  Relationships  . Social connections:    Talks on phone: Not on file    Gets together: Not on file    Attends religious service: Not on file    Active member of club or organization: Not  on file    Attends meetings of clubs or organizations: Not on file    Relationship status: Not on file  . Intimate partner violence:    Fear of current or ex partner: Not on file    Emotionally abused: Not on file    Physically abused: Not on file    Forced sexual activity: Not on file  Other Topics Concern  . Not on file  Social History Narrative   As of 09/01/27 wife is 88 and they have been married 36 years live at home with caretakers.     Past Surgical History:  Procedure Laterality Date  . BRAIN SURGERY     meningioma resection  . GALLBLADDER SURGERY  2000  . SHOULDER SURGERY  2000   rotator cuff    Family History  Problem Relation Age of Onset  . Diabetes Mother   . Heart disease Father     Allergies  Allergen Reactions  . Mirtazapine Rash    Current Outpatient Medications on File Prior to Visit  Medication Sig Dispense Refill  . aspirin 81 MG tablet Take by mouth.    Marland Kitchen atorvastatin (LIPITOR) 10 MG tablet TAKE ONE TABLET BY MOUTH EVERY DAY 90 tablet 3  . calcium carbonate (OSCAL) 1500 (600 Ca) MG TABS tablet Take 600 mg of elemental calcium by mouth 2 (two) times daily with a meal.    . citalopram (CELEXA) 20 MG tablet TAKE ONE TABLET BY MOUTH EVERY DAY 90 tablet 1  . docusate sodium (COLACE) 100 MG capsule Take 100 mg by mouth 2 (two) times daily.    Marland Kitchen donepezil (ARICEPT) 10 MG tablet TAKE 1 TABLET EVERY EVENING 90 tablet 5  . enalapril (VASOTEC) 2.5 MG tablet Take by mouth.    . memantine (NAMENDA) 5 MG tablet Take 5 mg by mouth 2 (two) times daily.    . Multiple Vitamin (MULTIVITAMIN) capsule Take by mouth.    . Multiple Vitamins-Minerals (PRESERVISION AREDS 2) CAPS Take 1 capsule by mouth daily.    . polyethylene glycol (MIRALAX) packet Take 17 g by mouth daily. 14 each 0  . ranitidine (ZANTAC) 150 MG tablet TAKE 1 TABLET BY MOUTH 2 TIMES DAILY 60 tablet 0  . [DISCONTINUED] pantoprazole (PROTONIX) 40 MG tablet Take 40 mg by mouth daily.       No current  facility-administered medications on file prior to visit.     BP (!) 160/74 (BP Location: Left Arm, Patient Position: Sitting, Cuff Size: Normal)   Pulse 75   Temp 97.9 F (36.6 C) (Oral)   Wt 145 lb (65.8 kg)   SpO2 99%   BMI 20.81 kg/m       Objective:  Physical Exam  Constitutional:  Thin, appears optimally nourished, smiling during exam  HENT:  Mouth/Throat: Oropharynx is clear and moist and mucous membranes are normal.  Eyes: Pupils are equal, round, and reactive to light. No scleral icterus.  Neck: Neck supple.  Cardiovascular: Normal rate, regular rhythm and intact distal pulses.  Pulmonary/Chest: Effort normal and breath sounds normal. He has no wheezes.  Abdominal: Soft. Bowel sounds are normal. There is no tenderness.  Lymphadenopathy:    He has no cervical adenopathy.  Neurological: He is alert. Gait normal.  Ambulates with cane.   Skin: Skin is warm and dry. No rash noted.  Psychiatric: He has a normal mood and affect.       Assessment & Plan:  1. Weight loss, unintentional Weight has been maintained from 12/17/17. Workup for abnormal weight loss completed in 12/18 was unrevealing for etiology of weight loss. Evaluation through GI revealed that most likely cause of poor food intake was from dementia and possible depression. Denies depressed mood today. He  has remained on Zantac without adverse effects per caretaker. Patient is unable to recall details related to history due to history of dementia.  Discussed with patient and caretaker that work up for weight loss including additional imaging and lab work recently completed did not reveal cause of weight loss. He and his caretaker are not interested in further work up but plan to contact hospice nurse again to determine when patient may qualify for services.  We discussed the importance of monitoring dietary intake and offering frequent snacks. Further advised caretaker the importance of hydration and encouraging  water intake. Caretaker will also be aware of reminding patient to eat and drink and will report any change in behaviors. Patient and caretaker are not interested in additional medications such as appetite stimulants due to potential adverse effects and will follow up with hospice and palliative care to discuss next steps.  Return precautions provided.  Delano Metz, FNP-C

## 2018-02-15 ENCOUNTER — Ambulatory Visit (INDEPENDENT_AMBULATORY_CARE_PROVIDER_SITE_OTHER): Payer: Medicare Other | Admitting: Family Medicine

## 2018-02-15 VITALS — BP 160/74 | HR 75 | Temp 97.9°F | Wt 145.0 lb

## 2018-02-15 DIAGNOSIS — R634 Abnormal weight loss: Secondary | ICD-10-CM | POA: Diagnosis not present

## 2018-02-15 NOTE — Patient Instructions (Signed)
Please increase water intake and also add another ensure with frequent snacks. Snacks including peanut butter can be helpful.  Weight has been maintained from April visit. You can monitor your weight at home and let us know if you are not maintaining your weight.   Advise contacting hospice/palliative care nurse to determine qualifying factors.   Failure to Thrive, Adult Failure to thrive is a group of problems. These problems include eating too little and losing weight. People who have this condition may do fewer and fewer activities over time. They may lose interest in being with friends or they may not want to eat or drink. Follow these instructions at home:  Take medicines only as told by your doctor.  Eat a healthy, well-balanced diet. Make sure that you eat enough.  Be active. Do strength training. A physical therapist can help to set up an exercise program that fits you.  Make sure that you are safe at home.  Make sure that you have a plan for what to do if you cannot make decisions for yourself. Contact a doctor if:  You are not able to eat well.  You are not able to move around.  You feel very sad.  You feel very hopeless. Get help right away if:  You think about ending your life.  You cannot eat or drink.  You do not get out of bed.  Staying at home is not safe.  You have a fever. This information is not intended to replace advice given to you by your health care provider. Make sure you discuss any questions you have with your health care provider. Document Released: 08/11/2011 Document Revised: 01/28/2016 Document Reviewed: 11/17/2014 Elsevier Interactive Patient Education  [employer].

## 2018-02-16 ENCOUNTER — Encounter: Payer: Self-pay | Admitting: Family Medicine

## 2018-03-01 ENCOUNTER — Telehealth: Payer: Self-pay

## 2018-03-01 NOTE — Telephone Encounter (Signed)
Form has been placed in red folder.  

## 2018-03-01 NOTE — Telephone Encounter (Signed)
Copied from [place] [id number]. Topic: Quick Communication - See Telephone Encounter >> Feb 27, 2018  2:29 PM Hewitt Shorts wrote: Langley Gauss with [facility] is calling stating that the patient caregiver -cousin is requesting hospice care   Best number for denise [phone] >> Feb 28, 2018 12:11 PM Jarold Motto Fraser Din wrote: Hospice has called back to follow up on referral and fax sent over. Please advise [phone]

## 2018-03-02 NOTE — Telephone Encounter (Signed)
Spoke with Langley Gauss to let her know that we have faxed over the information and gave her the verbal to go ahead and assess the pt.

## 2018-03-02 NOTE — Telephone Encounter (Signed)
Derrick Sullivan with Derrick Sullivan following up again on this request. They need:  1.Face with Demo and insurance. 2. Most recent OV notes.  Fax:  [phone] Please call asap with a verbal to assess.  Derrick Sullivan call back:  [phone]

## 2018-03-02 NOTE — Telephone Encounter (Signed)
Please advise 

## 2018-03-19 ENCOUNTER — Other Ambulatory Visit: Payer: Self-pay | Admitting: Gastroenterology

## 2018-04-12 ENCOUNTER — Other Ambulatory Visit: Payer: Self-pay | Admitting: Internal Medicine

## 2018-05-07 ENCOUNTER — Other Ambulatory Visit: Payer: Self-pay | Admitting: Internal Medicine

## 2018-05-08 ENCOUNTER — Other Ambulatory Visit: Payer: Self-pay

## 2018-05-08 NOTE — Telephone Encounter (Signed)
Copied from [place] [id number]. Topic: General - Other >> May 08, 2018 11:49 AM Judyann Munson wrote: Reason for CRM: [facility] is calling to advise the medication memantine (NAMENDA) 5 MG tablet was denied,  she requesting a call back in regards to this her best contact number is  [phone].

## 2018-05-09 NOTE — Telephone Encounter (Signed)
Has not recently seen PCP correct?

## 2018-05-10 MED ORDER — MEMANTINE HCL 5 MG PO TABS: 5.0000 mg | ORAL_TABLET | Freq: Two times a day (BID) | ORAL | 5 refills | Status: DC

## 2018-05-10 NOTE — Telephone Encounter (Signed)
Refilled: historical medication never been filled in our office Last OV: 12/02/2015(has been seen several times for acute issues by different providers in the office) Next OV: not scheduled

## 2018-05-10 NOTE — Addendum Note (Signed)
Addended by: Adair Laundry on: 05/10/2018 08:35 AM   Modules accepted: Orders

## 2018-05-14 ENCOUNTER — Ambulatory Visit: Payer: Medicare Other

## 2018-05-28 ENCOUNTER — Ambulatory Visit: Payer: Medicare Other

## 2018-06-18 ENCOUNTER — Telehealth: Payer: Self-pay | Admitting: *Deleted

## 2018-06-18 NOTE — Telephone Encounter (Signed)
No longer need atorvastatin

## 2018-06-18 NOTE — Telephone Encounter (Signed)
Notified hospice nurse.  She voiced understanding.

## 2018-06-18 NOTE — Telephone Encounter (Signed)
Copied from [place] [id number]. Topic: General - Other >> Jun 18, 2018  3:16 PM Valla Leaver wrote: Reason for CRM: Otila Kluver, RN  with [facility] calling to report patient has 3 days left of lipitor but weighs 137lbs so it may not be necessary to continue. [phone]

## 2018-07-05 ENCOUNTER — Other Ambulatory Visit: Payer: Self-pay | Admitting: Internal Medicine

## 2018-07-11 ENCOUNTER — Emergency Department
Admission: EM | Admit: 2018-07-11 | Discharge: 2018-07-11 | Disposition: A | Discharge status: Home / Self Care | Attending: Emergency Medicine | Admitting: Emergency Medicine

## 2018-07-11 ENCOUNTER — Emergency Department

## 2018-07-11 ENCOUNTER — Other Ambulatory Visit: Payer: Self-pay

## 2018-07-11 DIAGNOSIS — Y939 Activity, unspecified: Secondary | ICD-10-CM | POA: Diagnosis not present

## 2018-07-11 DIAGNOSIS — I10 Essential (primary) hypertension: Secondary | ICD-10-CM | POA: Diagnosis not present

## 2018-07-11 DIAGNOSIS — F0280 Dementia in other diseases classified elsewhere without behavioral disturbance: Secondary | ICD-10-CM | POA: Insufficient documentation

## 2018-07-11 DIAGNOSIS — Z79899 Other long term (current) drug therapy: Secondary | ICD-10-CM | POA: Diagnosis not present

## 2018-07-11 DIAGNOSIS — Y929 Unspecified place or not applicable: Secondary | ICD-10-CM | POA: Insufficient documentation

## 2018-07-11 DIAGNOSIS — Y999 Unspecified external cause status: Secondary | ICD-10-CM | POA: Diagnosis not present

## 2018-07-11 DIAGNOSIS — Z7982 Long term (current) use of aspirin: Secondary | ICD-10-CM | POA: Insufficient documentation

## 2018-07-11 DIAGNOSIS — W01190A Fall on same level from slipping, tripping and stumbling with subsequent striking against furniture, initial encounter: Secondary | ICD-10-CM | POA: Diagnosis not present

## 2018-07-11 DIAGNOSIS — E1151 Type 2 diabetes mellitus with diabetic peripheral angiopathy without gangrene: Secondary | ICD-10-CM | POA: Insufficient documentation

## 2018-07-11 DIAGNOSIS — G301 Alzheimer's disease with late onset: Secondary | ICD-10-CM | POA: Insufficient documentation

## 2018-07-11 DIAGNOSIS — S0101XA Laceration without foreign body of scalp, initial encounter: Secondary | ICD-10-CM | POA: Diagnosis present

## 2018-07-11 MED ORDER — LIDOCAINE HCL (PF) 1 % IJ SOLN
INTRAMUSCULAR | Status: AC
  Filled 2018-07-11: qty 5

## 2018-07-11 NOTE — ED Notes (Signed)
Have attempted to contact both family members in chart without success.

## 2018-07-11 NOTE — ED Provider Notes (Signed)
[facility] Emergency Department Provider Note   First MD Initiated Contact with Patient 07/11/18 [phone]     (approximate)  I have reviewed the triage vital signs and the nursing notes.   HISTORY  Chief Complaint Fall    HPI Derrick Sullivan is a 84 y.o. male with below list of chronic medical conditions presents to the emergency department following accidental trip and fall with resultant occipital head injury.  Patient denies any loss of consciousness.  Patient denies any other complaints.   Past Medical History:  Diagnosis Date  . Benign meningioma (Derrick Sullivan) 2006   s/p parietal resection  . Benign prostatic hypertrophy   . CAD (coronary artery disease)   . Carotid artery stenosis, asymptomatic 2008   hemodynamically insignificant  . Degenerative disk disease   . Dementia (Derrick Sullivan)   . Diverticulosis of colon 2007  . Duodenal ulcer 07/2004   NSAID induced  . Hyperlipidemia   . Hypertension 09/26/2011  . Osteopenia   . Osteoporosis   . Peripheral vascular disease ([place])   . Presbyacusis     Patient Active Problem List   Diagnosis Date Noted  . Abdominal pain 10/08/2017  . Chronic venous insufficiency 05/19/2017  . Urinary frequency 03/01/2016  . Late onset Alzheimer's disease without behavioral disturbance ([name]) 01/13/2016  . UTI (urinary tract infection) 12/02/2015  . Abdominal pain, epigastric 10/31/2015  . Do not resuscitate discussion 05/13/2015  . Bilateral lower extremity edema 12/23/2014  . Autonomic dysfunction 10/15/2014  . Unsteady gait 05/13/2013  . Functional constipation 10/02/2012  . Mitral insufficiency 05/11/2012  . Other and unspecified hyperlipidemia 03/27/2012  . Neuropathy of foot 11/27/2011  . Duodenal ulcer due to nonsteroidal anti-inflammatory drug (NSAID) 09/26/2011  . Hypertension 09/26/2011  . Diabetes mellitus with peripheral artery disease (Derrick Sullivan) 09/26/2011  . Alzheimer's dementia with behavioral disturbance (Derrick Sullivan)  08/23/2011  . Benign prostatic hypertrophy   . Benign meningioma (Derrick Sullivan)   . Osteopenia   . Carotid stenosis, left   . Diverticulosis of colon     Past Surgical History:  Procedure Laterality Date  . BRAIN SURGERY     meningioma resection  . GALLBLADDER SURGERY  2000  . SHOULDER SURGERY  2000   rotator cuff    Prior to Admission medications   Medication Sig Start Date End Date Taking? Authorizing Provider  aspirin 81 MG tablet Take by mouth. 10/19/10   [provider]  calcium carbonate (OSCAL) 1500 (600 Ca) MG TABS tablet Take 600 mg of elemental calcium by mouth 2 (two) times daily with a meal.    [provider]  citalopram (CELEXA) 20 MG tablet TAKE ONE TABLET BY MOUTH EVERY DAY 04/13/18   Crecencio Mc, MD  [facility] MELATONIN 3 MG TABS TAKE 1 TABLET BY MOUTH AT BEDTIME 07/05/18   Crecencio Mc, MD  docusate sodium (COLACE) 100 MG capsule Take 100 mg by mouth 2 (two) times daily.    [provider]  donepezil (ARICEPT) 10 MG tablet TAKE 1 TABLET EVERY EVENING 01/11/18   Crecencio Mc, MD  enalapril (VASOTEC) 2.5 MG tablet Take by mouth.    [provider]  memantine (NAMENDA) 5 MG tablet Take 1 tablet (5 mg total) by mouth 2 (two) times daily. 05/10/18   Crecencio Mc, MD  Multiple Vitamin (MULTIVITAMIN) capsule Take by mouth.    [provider]  Multiple Vitamins-Minerals (PRESERVISION AREDS 2) CAPS Take 1 capsule by mouth daily.    [provider]  polyethylene glycol (MIRALAX) packet Take 17 g by mouth daily. 01/20/17   Schuyler Amor, MD  ranitidine (ZANTAC) 150 MG tablet TAKE 1 TABLET BY MOUTH TWICE A DAY 05/08/18   Crecencio Mc, MD  pantoprazole (PROTONIX) 40 MG tablet Take 40 mg by mouth daily.    11/18/11  [provider]    Allergies Mirtazapine  Family History  Problem Relation Age of Onset  . Diabetes Mother   . Heart disease Father     Social History Social History   Tobacco Use  . Smoking status:  Never Smoker  . Smokeless tobacco: Former Network engineer Use Topics  . Alcohol use: No  . Drug use: No    Review of Systems Constitutional: No fever/chills Eyes: No visual changes. ENT: No sore throat. Cardiovascular: Denies chest pain. Respiratory: Denies shortness of breath. Gastrointestinal: No abdominal pain.  No nausea, no vomiting.  No diarrhea.  No constipation. Genitourinary: Negative for dysuria. Musculoskeletal: Negative for neck pain.  Negative for back pain. Integumentary: Negative for rash.  Positive for occipital laceration. Neurological: Negative for headaches, focal weakness or numbness.  ____________________________________________   PHYSICAL EXAM:  VITAL SIGNS: ED Triage Vitals  Enc Vitals Group     BP      Pulse      Resp      Temp      Temp src      SpO2      Weight      Height      Head Circumference      Peak Flow      Pain Score      Pain Loc      Pain Edu?      Excl. in [place]?     Constitutional: Alert and oriented. Well appearing and in no acute distress. Eyes: Conjunctivae are normal. PERRL. EOMI. Head: 3 cm linear occipital scalp laceration. Mouth/Throat: Mucous membranes are moist.  Oropharynx non-erythematous. Neck: No stridor.  Pain with C 4 5 6  palpation Cardiovascular: Normal rate, regular rhythm. Good peripheral circulation. Grossly normal heart sounds. Respiratory: Normal respiratory effort.  No retractions. Lungs CTAB. Gastrointestinal: Soft and nontender. No distention.  Musculoskeletal: No lower extremity tenderness nor edema. No gross deformities of extremities. Neurologic:  Normal speech and language. No gross focal neurologic deficits are appreciated.  Skin: 3 cm linear laceration to the occipital scalp. Psychiatric: Mood and affect are normal. Speech and behavior are normal.  ________________  RADIOLOGY I, [place] N Gaiser, personally viewed and evaluated these images (plain radiographs) as part of my medical decision  making, as well as reviewing the written report by the radiologist.  ED MD interpretation: No evidence of acute intracranial abnormality on CT scan of the head per radiologist.  Likewise negative cervical spine  Official radiology report(s): Ct Head Wo Contrast  Result Date: 07/11/2018 CLINICAL DATA:  [name] and hit head on furniture. Occipital injury/laceration. Initial encounter. EXAM: CT HEAD WITHOUT CONTRAST CT CERVICAL SPINE WITHOUT CONTRAST TECHNIQUE: Multidetector CT imaging of the head and cervical spine was performed following the standard protocol without intravenous contrast. Multiplanar CT image reconstructions of the cervical spine were also generated. COMPARISON:  Head CT 12/17/2017. Brain MRI 05/22/2013. FINDINGS: CT HEAD FINDINGS Brain: There is no evidence of acute infarct, intracranial hemorrhage, mass, midline shift, or extra-axial fluid collection. Focal encephalomalacia in the right parietal lobe near the vertex is unchanged and may be related to prior meningioma resection. There is mild cerebral atrophy. Cerebral white matter  hypodensities are unchanged and nonspecific but compatible with mild chronic small vessel ischemic disease. Vascular: Vascular calcifications. No hyperdense vessel. Skull: Right parietal craniotomy. No acute fracture. Sinuses/Orbits: Visualized paranasal sinuses and mastoid air cells are clear. Orbits are unremarkable. Other: None. CT CERVICAL SPINE FINDINGS Alignment: 2 mm retrolisthesis of C3 on C4, chronic based on the prior MRI. Trace anterolisthesis of C7 on T1. Skull base and vertebrae: Diffuse flowing solid anterior vertebral ossification from C3 into the upper thoracic spine. Fusion across the disc spaces at C4-5, C5-6, and C6-7. Chronically discontinuous anterior vertebral ossification at C2-3 with nitrogen gas extending from the C3-4 disc space superiorly. No acute fracture or destructive osseous process. Bulky ligamentous thickening/pannus posterior to the  dens resulting in moderate spinal stenosis without gross cord compression. Soft tissues and spinal canal: No prevertebral fluid or swelling. No visible canal hematoma. Disc levels: Multilevel osseous neural foraminal stenosis, moderate at C6-7. Upper chest: No apical lung consolidation or mass. Other: None. IMPRESSION: 1. No evidence of acute intracranial abnormality. 2. Mild chronic small vessel ischemic disease. 3. No evidence of acute cervical spine fracture. 4. Diffuse idiopathic skeletal hyperostosis. Electronically Signed   By: Logan Bores M.D.   On: 07/11/2018 01:15   Ct Cervical Spine Wo Contrast  Result Date: 07/11/2018 CLINICAL DATA:  [name] [address] and hit head on furniture. Occipital injury/laceration. Initial encounter. EXAM: CT HEAD WITHOUT CONTRAST CT CERVICAL SPINE WITHOUT CONTRAST TECHNIQUE: Multidetector CT imaging of the head and cervical spine was performed following the standard protocol without intravenous contrast. Multiplanar CT image reconstructions of the cervical spine were also generated. COMPARISON:  Head CT 12/17/2017. Brain MRI 05/22/2013. FINDINGS: CT HEAD FINDINGS Brain: There is no evidence of acute infarct, intracranial hemorrhage, mass, midline shift, or extra-axial fluid collection. Focal encephalomalacia in the right parietal lobe near the vertex is unchanged and may be related to prior meningioma resection. There is mild cerebral atrophy. Cerebral white matter hypodensities are unchanged and nonspecific but compatible with mild chronic small vessel ischemic disease. Vascular: Vascular calcifications. No hyperdense vessel. Skull: Right parietal craniotomy. No acute fracture. Sinuses/Orbits: Visualized paranasal sinuses and mastoid air cells are clear. Orbits are unremarkable. Other: None. CT CERVICAL SPINE FINDINGS Alignment: 2 mm retrolisthesis of C3 on C4, chronic based on the prior MRI. Trace anterolisthesis of C7 on T1. Skull base and vertebrae: Diffuse flowing solid anterior  vertebral ossification from C3 into the upper thoracic spine. Fusion across the disc spaces at C4-5, C5-6, and C6-7. Chronically discontinuous anterior vertebral ossification at C2-3 with nitrogen gas extending from the C3-4 disc space superiorly. No acute fracture or destructive osseous process. Bulky ligamentous thickening/pannus posterior to the dens resulting in moderate spinal stenosis without gross cord compression. Soft tissues and spinal canal: No prevertebral fluid or swelling. No visible canal hematoma. Disc levels: Multilevel osseous neural foraminal stenosis, moderate at C6-7. Upper chest: No apical lung consolidation or mass. Other: None. IMPRESSION: 1. No evidence of acute intracranial abnormality. 2. Mild chronic small vessel ischemic disease. 3. No evidence of acute cervical spine fracture. 4. Diffuse idiopathic skeletal hyperostosis. Electronically Signed   By: Logan Bores M.D.   On: 07/11/2018 01:15    ____________________________________________   PROCEDURES    .Marland KitchenLaceration Repair Date/Time: 07/11/2018 10:12 PM Performed by: Gregor Hams, MD Authorized by: Gregor Hams, MD   Consent:    Consent obtained:  Verbal   Consent given by:  Patient   Risks discussed:  Infection, pain, retained foreign body, poor cosmetic result and  poor wound healing Anesthesia (see MAR for exact dosages):    Anesthesia method:  Local infiltration   Local anesthetic:  Lidocaine 1% w/o epi Laceration details:    Location:  Scalp   Scalp location:  Occipital   Length (cm):  3 Repair type:    Repair type:  Simple Exploration:    Hemostasis achieved with:  Direct pressure   Wound exploration: entire depth of wound probed and visualized     Contaminated: no   Treatment:    Area cleansed with:  Saline   Amount of cleaning:  Extensive   Irrigation solution:  Sterile saline   Visualized foreign bodies/material removed: no   Skin repair:    Repair method:  Staples   Number of  staples:  2 Approximation:    Approximation:  Close Post-procedure details:    Dressing:  Sterile dressing   Patient tolerance of procedure:  Tolerated well, no immediate complications     ____________________________________________   INITIAL IMPRESSION / ASSESSMENT AND PLAN / ED COURSE  As part of my medical decision making, I reviewed the following data within the [place] NUMBER   84 year old male presenting to the emergency department above-stated history and physical exam secondary to accidental trip and fall.  Patient denies any preceding symptoms and denies any complaints at present.  Patient's occipital scalp laceration repaired without any difficulty via staples.  Patient advised to have staples removed in 7 days.  CT scan of the head and cervical spine revealed no acute abnormality.  Evaluation before discharge patient without any complaints ____________________________________________  FINAL CLINICAL IMPRESSION(S) / ED DIAGNOSES  Final diagnoses:  Scalp laceration, initial encounter     MEDICATIONS GIVEN DURING THIS VISIT:  Medications - No data to display   ED Discharge Orders    None       Note:  This document was prepared using Dragon voice recognition software and may include unintentional dictation errors.    Gregor Hams, MD 07/11/18 2214

## 2018-07-11 NOTE — ED Triage Notes (Signed)
Pt brought in via EMS. Had fall at home without loss of consciousness. Hit head on furniture. Takes a daily ASA. BP 140/63; O2 100%; HR 60 per EMS.

## 2018-07-11 NOTE — ED Notes (Signed)
EKG completed and given to EDP Liddell in person.

## 2018-07-11 NOTE — ED Notes (Signed)
Pt resting comfortably

## 2018-07-11 NOTE — ED Notes (Signed)
Discharge reviewed with son via phone and caregiver who is present.

## 2018-07-11 NOTE — ED Notes (Signed)
Pt leaving for CT.  

## 2018-07-11 NOTE — ED Notes (Signed)
Small bleeding cut to back of pt's head.

## 2018-07-11 NOTE — ED Notes (Signed)
Caregiver signed paper discharge

## 2018-07-11 NOTE — ED Notes (Signed)
Son returned call after speaking with charge RN.  He reports that [facility] from [facility] will be coming to pick his father up.

## 2018-07-17 ENCOUNTER — Ambulatory Visit: Admitting: Internal Medicine

## 2018-07-17 ENCOUNTER — Telehealth: Payer: Self-pay | Admitting: Internal Medicine

## 2018-07-17 NOTE — Telephone Encounter (Signed)
Ok to give verbal for staple removal?

## 2018-07-17 NOTE — Telephone Encounter (Signed)
Copied from [place] [id number]. Topic: General - Other >> Jul 17, 2018 10:13 AM Keene Breath wrote: Reason for CRM: Otila Kluver with [facility] called to inform the doctor that patient fell on 07/11/18, went to Er, received staples, and they are due to come out today or tomorrow.  Otila Kluver would like verbal orders to remove staples.  Please advise.  CB# [phone]

## 2018-07-17 NOTE — Telephone Encounter (Signed)
I called & gave verbal to [facility] with Hospice. She stated that he only had about a week left on aricept prescription. She stated that they were going to d/c medication because it is not helping patient. She just wanted you to be aware.

## 2018-07-17 NOTE — Telephone Encounter (Signed)
Verbal order authorized

## 2018-08-14 ENCOUNTER — Other Ambulatory Visit: Payer: Self-pay | Admitting: Internal Medicine

## 2018-08-15 ENCOUNTER — Other Ambulatory Visit: Payer: Self-pay | Admitting: Internal Medicine

## 2018-08-15 MED ORDER — FAMOTIDINE 20 MG PO TABS: 20.0000 mg | ORAL_TABLET | Freq: Two times a day (BID) | ORAL | 5 refills | Status: AC

## 2018-09-03 ENCOUNTER — Other Ambulatory Visit: Payer: Self-pay | Admitting: Internal Medicine

## 2018-10-09 ENCOUNTER — Non-Acute Institutional Stay (SKILLED_NURSING_FACILITY): Payer: Medicare Other | Admitting: Internal Medicine

## 2018-10-09 ENCOUNTER — Encounter: Payer: Self-pay | Admitting: Internal Medicine

## 2018-10-09 DIAGNOSIS — G301 Alzheimer's disease with late onset: Secondary | ICD-10-CM | POA: Diagnosis not present

## 2018-10-09 DIAGNOSIS — I872 Venous insufficiency (chronic) (peripheral): Secondary | ICD-10-CM

## 2018-10-09 DIAGNOSIS — K269 Duodenal ulcer, unspecified as acute or chronic, without hemorrhage or perforation: Secondary | ICD-10-CM

## 2018-10-09 DIAGNOSIS — E1151 Type 2 diabetes mellitus with diabetic peripheral angiopathy without gangrene: Secondary | ICD-10-CM | POA: Diagnosis not present

## 2018-10-09 DIAGNOSIS — K5904 Chronic idiopathic constipation: Secondary | ICD-10-CM

## 2018-10-09 DIAGNOSIS — F0280 Dementia in other diseases classified elsewhere without behavioral disturbance: Secondary | ICD-10-CM

## 2018-10-09 DIAGNOSIS — T39395A Adverse effect of other nonsteroidal anti-inflammatory drugs [NSAID], initial encounter: Secondary | ICD-10-CM

## 2018-10-09 DIAGNOSIS — G909 Disorder of the autonomic nervous system, unspecified: Secondary | ICD-10-CM

## 2018-10-09 DIAGNOSIS — M8580 Other specified disorders of bone density and structure, unspecified site: Secondary | ICD-10-CM

## 2018-10-09 DIAGNOSIS — I10 Essential (primary) hypertension: Secondary | ICD-10-CM

## 2018-10-09 NOTE — Progress Notes (Signed)
Patient ID: Derrick Sullivan, male   DOB: [birth year], 85 y.o.   MRN: [id number]  Provider:  Rexene Edison. Mariea Clonts, D.O., C.M.D. Location:  [facility] Room Number: 106 Place of Service:  SNF (31) Previous PCP was Dr. Deborra Medina with [facility]  Patient Care Team: Gayland Curry, DO as PCP - General (Geriatric Medicine)  Extended Emergency Contact Information Primary Emergency Contact: Marcy, Bogosian Mobile Phone: [phone] Relation: Son Secondary Emergency Contact: Home, Care Home Phone: [phone] Mobile Phone: [phone] Relation: Other  Code Status: DNR, hospice care Goals of Care: Advanced Directive information Advanced Directives 10/09/2018  Does Patient Have a Medical Advance Directive? Yes  Type of Paramedic of [place];Out of facility DNR (pink MOST or yellow form)  Does patient want to make changes to medical advance directive? No - Patient declined  Copy of [place] in Chart? Yes - validated most recent copy scanned in chart (See row information)  Would patient like information on creating a medical advance directive? -  Pre-existing out of facility DNR order (yellow form or pink MOST form) Yellow form placed in chart (order not valid for inpatient use)   Chief Complaint  Patient presents with  . Establish Care    new patient to [facility]  . New Admit To SNF    new admission    HPI: Patient is a 85 y.o. male seen today for admission to [facility] for long-term care along with his wife.  They arrived here on 10/05/2018.  He has a medical h/o a benign meningioma that was resected in 2006, BPH, CAD, carotid stenosis, DDD, dementia, diverticulosis, htn, osteoporosis, PVD, presbycusis, DMII with PAD, and prior rotator cuff and gall bladder surgeries both in 2000.    He has a family history of diabetes in his mother and heart disease in his father who are both deceased.    He does not drink alcohol, smoke  or abuse drugs.  He did formerly use smokeless tobacco but quit in 1957.  He and his wife have been married for 66 years and previously lived at home with caretakers.  Both are on hospice.  His most recent event in epic was a fall with ED visit back in Nov when he had a scalp laceration.  His gait is unsteady.    He was last seen at [facility] in June by an NP for weight loss.  He had a workup for this which was negative for h Pylori in 12/18--felt to be due to dementia and or depression.  He was treated with zantac.  He had not qualified in March for hospice but did later in June.  He was forgetting to eat if not reminded and didn't even drink a full bottle of water in a day.  Small snacks and higher calorie foods plus ensure were recommended in June '19 and hospice referral was reentered.    When seen today, he he was a on regular diet with thin liquids, and getting boost shakes.    Staff were concerned about his seborrheic dermatitis of his face and scalp so we ordered treatments for these.    Pt himself had no complaints and told stories from the past.    Past Medical History:  Diagnosis Date  . Benign meningioma ([place]) 2006   s/p parietal resection  . Benign prostatic hypertrophy   . CAD (coronary artery disease)   . Carotid artery stenosis, asymptomatic 2008   hemodynamically insignificant  .  Degenerative disk disease   . Dementia ([place])   . Diverticulosis of colon 2007  . Duodenal ulcer 07/2004   NSAID induced  . Hyperlipidemia   . Hypertension 09/26/2011  . Osteopenia   . Osteoporosis   . Peripheral vascular disease ([place])   . Presbyacusis    Past Surgical History:  Procedure Laterality Date  . BRAIN SURGERY     meningioma resection  . GALLBLADDER SURGERY  2000  . SHOULDER SURGERY  2000   rotator cuff    reports that he has never smoked. He quit smokeless tobacco use about 62 years ago. He reports that he does not drink alcohol or use drugs. Social History   Socioeconomic  History  . Marital status: Married    Spouse name: Not on file  . Number of children: Not on file  . Years of education: 46  . Highest education level: Not on file  Occupational History    Employer: RETIRED  Social Needs  . Financial resource strain: Not on file  . Food insecurity:    Worry: Not on file    Inability: Not on file  . Transportation needs:    Medical: Not on file    Non-medical: Not on file  Tobacco Use  . Smoking status: Never Smoker  . Smokeless tobacco: Former Network engineer and Sexual Activity  . Alcohol use: No  . Drug use: No  . Sexual activity: Never  Lifestyle  . Physical activity:    Days per week: Not on file    Minutes per session: Not on file  . Stress: Not on file  Relationships  . Social connections:    Talks on phone: Not on file    Gets together: Not on file    Attends religious service: Not on file    Active member of club or organization: Not on file    Attends meetings of clubs or organizations: Not on file    Relationship status: Not on file  . Intimate partner violence:    Fear of current or ex partner: Not on file    Emotionally abused: Not on file    Physically abused: Not on file    Forced sexual activity: Not on file  Other Topics Concern  . Not on file  Social History Narrative   As of 09/01/27 wife is 9 and they have been married 58 years live at home with caretakers.     Functional Status Survey:    Family History  Problem Relation Age of Onset  . Diabetes Mother   . Heart disease Father     Health Maintenance  Topic Date Due  . TETANUS/TDAP  06/08/1943  . OPHTHALMOLOGY EXAM  05/14/2014  . HEMOGLOBIN A1C  01/26/2015  . FOOT EXAM  07/29/2015  . INFLUENZA VACCINE  Completed  . PNA vac Low Risk Adult  Completed    Allergies  Allergen Reactions  . Mirtazapine Rash    Outpatient Encounter Medications as of 10/09/2018  Medication Sig  . acetaminophen (TYLENOL) 325 MG tablet Take 650 mg by mouth every 4 (four)  hours as needed.  Marland Kitchen aspirin 81 MG tablet Take 81 mg by mouth daily.   . citalopram (CELEXA) 20 MG tablet TAKE ONE TABLET BY MOUTH EVERY DAY  . [employer] MELATONIN 3 MG TABS TAKE 1 TABLET BY MOUTH AT BEDTIME  . famotidine (PEPCID) 20 MG tablet Take 1 tablet (20 mg total) by mouth 2 (two) times daily.  . feeding supplement (BOOST HIGH  PROTEIN) LIQD Take 1 Container by mouth daily.  . furosemide (LASIX) 20 MG tablet Take 20 mg by mouth daily as needed.   . hydrocortisone cream 1 % Apply 1 application topically every 6 (six) hours as needed for itching.  . hydrOXYzine (ATARAX/VISTARIL) 25 MG tablet Take 25 mg by mouth every 6 (six) hours as needed for itching.  Marland Kitchen LORazepam (ATIVAN) 0.5 MG tablet Take 0.5 mg by mouth 2 (two) times daily as needed for anxiety.  Marland Kitchen QUEtiapine (SEROQUEL) 25 MG tablet Take 25 mg by mouth 2 (two) times daily.  Marland Kitchen triamcinolone cream (KENALOG) 0.1 % Apply 1 application topically 2 (two) times daily.  . [DISCONTINUED] calcium carbonate (OSCAL) 1500 (600 Ca) MG TABS tablet Take 600 mg of elemental calcium by mouth 2 (two) times daily with a meal.  . [DISCONTINUED] docusate sodium (COLACE) 100 MG capsule TAKE 1 CAPSULE BY MOUTH EVERY DAY  . [DISCONTINUED] donepezil (ARICEPT) 10 MG tablet TAKE 1 TABLET EVERY EVENING  . [DISCONTINUED] enalapril (VASOTEC) 2.5 MG tablet Take by mouth.  . [DISCONTINUED] memantine (NAMENDA) 5 MG tablet Take 1 tablet (5 mg total) by mouth 2 (two) times daily.  . [DISCONTINUED] Multiple Vitamin (MULTIVITAMIN) capsule Take by mouth.  . [DISCONTINUED] Multiple Vitamins-Minerals (PRESERVISION AREDS 2) CAPS Take 1 capsule by mouth daily.  . [DISCONTINUED] pantoprazole (PROTONIX) 40 MG tablet Take 40 mg by mouth daily.    . [DISCONTINUED] polyethylene glycol (MIRALAX) packet Take 17 g by mouth daily.  . [DISCONTINUED] ranitidine (ZANTAC) 150 MG tablet TAKE 1 TABLET BY MOUTH TWICE A DAY   No facility-administered encounter medications on file as of 10/09/2018.      Review of Systems  Constitutional: Positive for appetite change, fatigue and unexpected weight change. Negative for activity change, chills and fever.  HENT: Positive for hearing loss, rhinorrhea and trouble swallowing. Negative for congestion.   Eyes:       Glasses  Respiratory: Negative for chest tightness, shortness of breath and wheezing.   Cardiovascular: Negative for chest pain, palpitations and leg swelling.  Gastrointestinal: Negative for abdominal pain, constipation, diarrhea, nausea and vomiting.       Incontinence  Genitourinary:       Incontinence  Musculoskeletal: Positive for gait problem. Negative for arthralgias and back pain.  Skin: Positive for pallor.       Dry scaly skin of face and scalp  Neurological: Negative for dizziness, weakness, light-headedness and headaches.  Psychiatric/Behavioral: Positive for confusion. Negative for agitation, behavioral problems and sleep disturbance. The patient is not nervous/anxious.     Vitals:   10/09/18 1001  BP: 122/76  Pulse: 75  Resp: (!) 22  Temp: 98.3 F (36.8 C)  TempSrc: Oral  SpO2: 98%  Weight: 140 lb (63.5 kg)  Height: 5\' 10"  (1.778 m)   Body mass index is 20.09 kg/m. Physical Exam Vitals signs and nursing note reviewed.  Constitutional:      General: He is not in acute distress.    Appearance: He is not toxic-appearing.     Comments: Frail, cachectic white male seated in wheelchair  HENT:     Head: Normocephalic and atraumatic.     Right Ear: There is no impacted cerumen.     Left Ear: There is no impacted cerumen.     Ears:     Comments: HOH    Nose: Rhinorrhea present. No congestion.     Mouth/Throat:     Pharynx: Oropharynx is clear. No oropharyngeal exudate.  Eyes:  Extraocular Movements: Extraocular movements intact.     Conjunctiva/sclera: Conjunctivae normal.     Pupils: Pupils are equal, round, and reactive to light.     Comments: glasses  Neck:     Musculoskeletal: Neck supple.   Cardiovascular:     Rate and Rhythm: Normal rate and regular rhythm.     Pulses: Normal pulses.  Pulmonary:     Effort: Pulmonary effort is normal.     Breath sounds: Normal breath sounds. No wheezing, rhonchi or rales.  Abdominal:     General: Bowel sounds are normal. There is no distension.     Palpations: Abdomen is soft. There is no mass.  Musculoskeletal: Normal range of motion.     Right lower leg: No edema.     Left lower leg: No edema.  Skin:    General: Skin is warm and dry.     Capillary Refill: Capillary refill takes less than 2 seconds.     Comments: Dry scaly skin of face, scalp  Neurological:     General: No focal deficit present.     Mental Status: He is alert.     Cranial Nerves: No cranial nerve deficit.     Motor: Weakness present.     Gait: Gait abnormal.     Comments: Very talkative male, perseverates, speech soft and difficult to understand; oriented to person and partially to place; not ambulatory at this time  Psychiatric:        Mood and Affect: Mood normal.     Comments: Very pleasant and conversant, joking with me     Labs reviewed: Basic Metabolic Panel: Recent Labs    12/17/17 1719  NA 138  K 4.2  CL 104  CO2 28  GLUCOSE 116*  BUN 27*  CREATININE 1.07  CALCIUM 9.4   Liver Function Tests: No results for input(s): AST, ALT, ALKPHOS, BILITOT, PROT, ALBUMIN in the last 8760 hours. No results for input(s): LIPASE, AMYLASE in the last 8760 hours. No results for input(s): AMMONIA in the last 8760 hours. CBC: Recent Labs    12/17/17 1719  WBC 5.6  HGB 13.0  HCT 37.9*  MCV 95.4  PLT 168   Cardiac Enzymes: Recent Labs    12/17/17 1719 12/17/17 1930  TROPONINI 0.04* 0.04*   BNP: Invalid input(s): POCBNP Lab Results  Component Value Date   HGBA1C 6.2 07/28/2014   Lab Results  Component Value Date   TSH 1.78 08/31/2017   Lab Results  Component Value Date   [id number] 119 07/04/2013   Lab Results  Component Value Date    FOLATE >24.8 08/23/2011   No results found for: IRON, TIBC, FERRITIN  Imaging and Procedures obtained prior to SNF admission: Ct Head Wo Contrast  Result Date: 07/11/2018 CLINICAL DATA:  [name] and hit head on furniture. Occipital injury/laceration. Initial encounter. EXAM: CT HEAD WITHOUT CONTRAST CT CERVICAL SPINE WITHOUT CONTRAST TECHNIQUE: Multidetector CT imaging of the head and cervical spine was performed following the standard protocol without intravenous contrast. Multiplanar CT image reconstructions of the cervical spine were also generated. COMPARISON:  Head CT 12/17/2017. Brain MRI 05/22/2013. FINDINGS: CT HEAD FINDINGS Brain: There is no evidence of acute infarct, intracranial hemorrhage, mass, midline shift, or extra-axial fluid collection. Focal encephalomalacia in the right parietal lobe near the vertex is unchanged and may be related to prior meningioma resection. There is mild cerebral atrophy. Cerebral white matter hypodensities are unchanged and nonspecific but compatible with mild chronic small vessel ischemic disease. Vascular: Vascular  calcifications. No hyperdense vessel. Skull: Right parietal craniotomy. No acute fracture. Sinuses/Orbits: Visualized paranasal sinuses and mastoid air cells are clear. Orbits are unremarkable. Other: None. CT CERVICAL SPINE FINDINGS Alignment: 2 mm retrolisthesis of C3 on C4, chronic based on the prior MRI. Trace anterolisthesis of C7 on T1. Skull base and vertebrae: Diffuse flowing solid anterior vertebral ossification from C3 into the upper thoracic spine. Fusion across the disc spaces at C4-5, C5-6, and C6-7. Chronically discontinuous anterior vertebral ossification at C2-3 with nitrogen gas extending from the C3-4 disc space superiorly. No acute fracture or destructive osseous process. Bulky ligamentous thickening/pannus posterior to the dens resulting in moderate spinal stenosis without gross cord compression. Soft tissues and spinal canal: No  prevertebral fluid or swelling. No visible canal hematoma. Disc levels: Multilevel osseous neural foraminal stenosis, moderate at C6-7. Upper chest: No apical lung consolidation or mass. Other: None. IMPRESSION: 1. No evidence of acute intracranial abnormality. 2. Mild chronic small vessel ischemic disease. 3. No evidence of acute cervical spine fracture. 4. Diffuse idiopathic skeletal hyperostosis. Electronically Signed   By: Logan Bores M.D.   On: 07/11/2018 01:15   Ct Cervical Spine Wo Contrast  Result Date: 07/11/2018 CLINICAL DATA:  [name] [address] and hit head on furniture. Occipital injury/laceration. Initial encounter. EXAM: CT HEAD WITHOUT CONTRAST CT CERVICAL SPINE WITHOUT CONTRAST TECHNIQUE: Multidetector CT imaging of the head and cervical spine was performed following the standard protocol without intravenous contrast. Multiplanar CT image reconstructions of the cervical spine were also generated. COMPARISON:  Head CT 12/17/2017. Brain MRI 05/22/2013. FINDINGS: CT HEAD FINDINGS Brain: There is no evidence of acute infarct, intracranial hemorrhage, mass, midline shift, or extra-axial fluid collection. Focal encephalomalacia in the right parietal lobe near the vertex is unchanged and may be related to prior meningioma resection. There is mild cerebral atrophy. Cerebral white matter hypodensities are unchanged and nonspecific but compatible with mild chronic small vessel ischemic disease. Vascular: Vascular calcifications. No hyperdense vessel. Skull: Right parietal craniotomy. No acute fracture. Sinuses/Orbits: Visualized paranasal sinuses and mastoid air cells are clear. Orbits are unremarkable. Other: None. CT CERVICAL SPINE FINDINGS Alignment: 2 mm retrolisthesis of C3 on C4, chronic based on the prior MRI. Trace anterolisthesis of C7 on T1. Skull base and vertebrae: Diffuse flowing solid anterior vertebral ossification from C3 into the upper thoracic spine. Fusion across the disc spaces at C4-5, C5-6, and  C6-7. Chronically discontinuous anterior vertebral ossification at C2-3 with nitrogen gas extending from the C3-4 disc space superiorly. No acute fracture or destructive osseous process. Bulky ligamentous thickening/pannus posterior to the dens resulting in moderate spinal stenosis without gross cord compression. Soft tissues and spinal canal: No prevertebral fluid or swelling. No visible canal hematoma. Disc levels: Multilevel osseous neural foraminal stenosis, moderate at C6-7. Upper chest: No apical lung consolidation or mass. Other: None. IMPRESSION: 1. No evidence of acute intracranial abnormality. 2. Mild chronic small vessel ischemic disease. 3. No evidence of acute cervical spine fracture. 4. Diffuse idiopathic skeletal hyperostosis. Electronically Signed   By: Logan Bores M.D.   On: 07/11/2018 01:15   MMSE at admission here:  13/30 struggled with orientation to time, partially to place, failed attention and calculation, recall, and pentagons  Assessment/Plan 1. Late onset Alzheimer's disease with behavioral disturbance ([place]) -is late stage with functional losses--on hospice care -he is on seroquel due to some delirium during adjustment to different environments--unclear if this is required long-term for him--will need reassessment -has been taken off aricept in recent past due to goals  of care being comfort and due to functional dependence indicating advanced disease  2. Diabetes mellitus with peripheral artery disease ([place]) -well controlled considering little po intake Lab Results  Component Value Date   HGBA1C 6.2 07/28/2014  -per records, PAD  3. Chronic venous insufficiency -compression hose typically recommended, but will need to be careful if he does have true PAD--I don't see ABIs  4. Autonomic dysfunction -at risk for falls due to orthostasis, but has not recently been ambulatory independently -will get brief PT, OT eval  5. Functional constipation -not currently on a  regular medication for this, standing orders are available  6. Duodenal ulcer due to nonsteroidal anti-inflammatory drug (NSAID) -avoid nsaids and other GI bleed risk meds, use tylenol if needed for pain -cont pepcid  7. Essential hypertension -bp at goal with current regimen  8. Osteopenia, unspecified location -off calcium supplements due to goals of care being comfort based and lack of mobility to ambulate  Family/ staff Communication: discussed with snf nurse  Cayton Cuevas L. Tiny Chaudhary, D.O. [facility] [address], [place] [address] Cell Phone (Mon-Fri 8am-5pm):  [phone] On Call:  [phone] & follow prompts after 5pm & weekends Office Phone:  [phone] Office Fax:  [phone]

## 2018-10-25 ENCOUNTER — Non-Acute Institutional Stay (SKILLED_NURSING_FACILITY): Payer: Medicare Other | Admitting: Adult Health

## 2018-10-25 ENCOUNTER — Encounter: Payer: Self-pay | Admitting: Adult Health

## 2018-10-25 DIAGNOSIS — N400 Enlarged prostate without lower urinary tract symptoms: Secondary | ICD-10-CM

## 2018-10-25 DIAGNOSIS — R8281 Pyuria: Secondary | ICD-10-CM

## 2018-10-25 NOTE — Progress Notes (Signed)
Location:  Occupational psychologist of Service:  SNF (31) Provider:   Cindi Carbon, ANP [place] [phone]   Gayland Curry, DO  Patient Care Team: Gayland Curry, DO as PCP - General (Geriatric Medicine)  Extended Emergency Contact Information Primary Emergency Contact: Bulmaro, [place] Mobile Phone: [phone] Relation: Son Secondary Emergency Contact: Home, Care Home Phone: [phone] Mobile Phone: [phone] Relation: Other  Code Status:  DNR Goals of care: Advanced Directive information Advanced Directives 10/09/2018  Does Patient Have a Medical Advance Directive? Yes  Type of Paramedic of [place];Out of facility DNR (pink MOST or yellow form)  Does patient want to make changes to medical advance directive? No - Patient declined  Copy of [place] in Chart? Yes - validated most recent copy scanned in chart (See row information)  Would patient like information on creating a medical advance directive? -  Pre-existing out of facility DNR order (yellow form or pink MOST form) Yellow form placed in chart (order not valid for inpatient use)     Chief Complaint  Patient presents with  . Acute Visit    foul smelling urine with gray mucus    HPI:  Pt is a 85 y.o. male seen today for an acute visit for foul smelling urine with gray mucus. The nurse on 2/19 took a picture of his urine in the brief and noted it to be gray and thick in the brief. The urine odor is causing issues on the hall, permeating and difficult to get rid of. The resident has a hx of dementia and is quite frail and followed by hospice. He is able to communicate and denies any pain or urinary symptoms but is a poor historian. He is on thickened liquids and the nurse worries he does not drink enough water. He has not had a fever and chills.    Past Medical History:  Diagnosis Date  . Benign meningioma ([place]) 2006   s/p parietal  resection  . Benign prostatic hypertrophy   . CAD (coronary artery disease)   . Carotid artery stenosis, asymptomatic 2008   hemodynamically insignificant  . Degenerative disk disease   . Dementia ([place])   . Diverticulosis of colon 2007  . Duodenal ulcer 07/2004   NSAID induced  . Hyperlipidemia   . Hypertension 09/26/2011  . Osteoporosis   . Peripheral vascular disease ([place])   . Presbyacusis    Past Surgical History:  Procedure Laterality Date  . BRAIN SURGERY     meningioma resection  . GALLBLADDER SURGERY  2000  . SHOULDER SURGERY  2000   rotator cuff    Allergies  Allergen Reactions  . Mirtazapine Rash    Outpatient Encounter Medications as of 10/25/2018  Medication Sig  . acetaminophen (TYLENOL) 325 MG tablet Take 650 mg by mouth every 4 (four) hours as needed.  Marland Kitchen aspirin 81 MG tablet Take 81 mg by mouth daily.   . citalopram (CELEXA) 20 MG tablet TAKE ONE TABLET BY MOUTH EVERY DAY  . [employer] MELATONIN 3 MG TABS TAKE 1 TABLET BY MOUTH AT BEDTIME  . famotidine (PEPCID) 20 MG tablet Take 1 tablet (20 mg total) by mouth 2 (two) times daily.  . feeding supplement (BOOST HIGH PROTEIN) LIQD Take 1 Container by mouth daily.  . furosemide (LASIX) 20 MG tablet Take 20 mg by mouth daily as needed.   . hydrocortisone cream 1 % Apply 1 application topically every 6 (six) hours  as needed for itching.  . hydrOXYzine (ATARAX/VISTARIL) 25 MG tablet Take 25 mg by mouth every 6 (six) hours as needed for itching.  Marland Kitchen LORazepam (ATIVAN) 0.5 MG tablet Take 0.5 mg by mouth 2 (two) times daily as needed for anxiety.  Marland Kitchen QUEtiapine (SEROQUEL) 25 MG tablet Take 25 mg by mouth 2 (two) times daily.  Marland Kitchen triamcinolone cream (KENALOG) 0.1 % Apply 1 application topically 2 (two) times daily.  . [DISCONTINUED] pantoprazole (PROTONIX) 40 MG tablet Take 40 mg by mouth daily.     No facility-administered encounter medications on file as of 10/25/2018.     Review of Systems  Unable to perform ROS: Dementia      Immunization History  Administered Date(s) Administered  . Influenza Split 07/04/2013  . Influenza, High Dose Seasonal PF 08/12/2015, 05/11/2016, 05/11/2017  . Influenza,inj,Quad PF,6+ Mos 06/25/2014  . Influenza-Unspecified 10/09/2018  . Pneumococcal Conjugate-13 07/28/2014  . Pneumococcal Polysaccharide-23 06/12/2013  . Zoster 08/01/2006   Pertinent  Health Maintenance Due  Topic Date Due  . OPHTHALMOLOGY EXAM  05/14/2014  . HEMOGLOBIN A1C  01/26/2015  . FOOT EXAM  07/29/2015  . URINE MICROALBUMIN  12/22/2015  . INFLUENZA VACCINE  Completed  . PNA vac Low Risk Adult  Completed   Fall Risk  10/09/2018 05/11/2017 04/05/2017 10/11/2016 05/11/2016  Falls in the past year? 1 No No No No  Number falls in past yr: 0 - - - -  Injury with Fall? 0 - - - -  Risk for fall due to : - - - - -  Follow up - - - - -   Functional Status Survey:    Vitals:   10/25/18 1518  BP: (!) 149/57  Pulse: 67  Resp: 18  Temp: 97.6 F (36.4 C)  SpO2: 100%   There is no height or weight on file to calculate BMI. Physical Exam Constitutional:      General: He is not in acute distress.    Appearance: He is not diaphoretic.  HENT:     Head: Normocephalic and atraumatic.  Neck:     Thyroid: No thyromegaly.     Vascular: No JVD.     Trachea: No tracheal deviation.  Cardiovascular:     Rate and Rhythm: Normal rate and regular rhythm.     Heart sounds: No murmur.  Pulmonary:     Effort: Pulmonary effort is normal. No respiratory distress.     Breath sounds: Normal breath sounds. No wheezing.  Abdominal:     General: Bowel sounds are normal. There is no distension.     Palpations: Abdomen is soft.     Tenderness: There is no abdominal tenderness. There is no right CVA tenderness or left CVA tenderness.  Lymphadenopathy:     Cervical: No cervical adenopathy.  Skin:    General: Skin is warm and dry.  Neurological:     Mental Status: He is alert. Mental status is at baseline.     Comments:  Oriented to self only  Psychiatric:        Mood and Affect: Mood normal.     Labs reviewed: Recent Labs    12/17/17 1719  NA 138  K 4.2  CL 104  CO2 28  GLUCOSE 116*  BUN 27*  CREATININE 1.07  CALCIUM 9.4   No results for input(s): AST, ALT, ALKPHOS, BILITOT, PROT, ALBUMIN in the last 8760 hours. Recent Labs    12/17/17 1719  WBC 5.6  HGB 13.0  HCT 37.9*  MCV 95.4  PLT 168   Lab Results  Component Value Date   TSH 1.78 08/31/2017   Lab Results  Component Value Date   HGBA1C 6.2 07/28/2014   Lab Results  Component Value Date   CHOL 165 11/06/2013   HDL 35.40 (L) 11/06/2013   LDLCALC 119 (H) 11/06/2013   LDLDIRECT 63.0 08/12/2015   TRIG 54.0 11/06/2013   CHOLHDL 5 11/06/2013    Significant Diagnostic Results in last 30 days:  No results found.  Assessment/Plan 1. Urine purulent Noted in brief with very foul smelling urine and agitation  Check UA and treat accordingly Encourage fluids and monitor vital signs  2. Benign prostatic hyperplasia without lower urinary tract symptoms Increases his risk of UTI but appears be voiding regularly without retention     Family/ staff Communication: discussed with his nurse  Labs/tests ordered:  UA C AND S VIA I AND O CATH

## 2018-11-06 ENCOUNTER — Other Ambulatory Visit: Payer: Self-pay | Admitting: Internal Medicine

## 2018-11-06 DIAGNOSIS — F99 Mental disorder, not otherwise specified: Principal | ICD-10-CM

## 2018-11-06 DIAGNOSIS — F5105 Insomnia due to other mental disorder: Secondary | ICD-10-CM

## 2018-11-06 MED ORDER — TRAZODONE HCL 50 MG PO TABS: 25.0000 mg | ORAL_TABLET | Freq: Every day | ORAL | 3 refills | Status: DC

## 2018-11-06 NOTE — Progress Notes (Signed)
Pt not sleeping at all at night.  Staff have attempted a onesie to help keep him from removing his depends and shredding them.  He's doing this with or without soiling present.  He has no clear unmet needs at those times, but stays wide awake and busy all through the night.  Discussed finding other ways to occupy him when he is awake and ensuring he is kept awake all day.  Will plan to d/c melatonin which has been ineffective for sleep and start trazodone for him 25mg  po qhs since he has a remeron allergy.  Monitor for one week.  If still not resting, notify DO/NP for possible dose increase if all of the above are being done as recommended.  Haywood Meinders L. Kinzley Savell, D.O. [facility] [address], [place] [address] Cell Phone (Mon-Fri 8am-5pm):  [phone] On Call:  [phone] & follow prompts after 5pm & weekends Office Phone:  [phone] Office Fax:  [phone]

## 2018-11-09 ENCOUNTER — Non-Acute Institutional Stay (SKILLED_NURSING_FACILITY): Payer: Medicare Other | Admitting: Adult Health

## 2018-11-09 ENCOUNTER — Encounter: Payer: Self-pay | Admitting: Adult Health

## 2018-11-09 DIAGNOSIS — R1310 Dysphagia, unspecified: Secondary | ICD-10-CM | POA: Diagnosis not present

## 2018-11-09 DIAGNOSIS — F5101 Primary insomnia: Secondary | ICD-10-CM

## 2018-11-09 DIAGNOSIS — G301 Alzheimer's disease with late onset: Secondary | ICD-10-CM

## 2018-11-09 DIAGNOSIS — E1151 Type 2 diabetes mellitus with diabetic peripheral angiopathy without gangrene: Secondary | ICD-10-CM

## 2018-11-09 DIAGNOSIS — K117 Disturbances of salivary secretion: Secondary | ICD-10-CM

## 2018-11-09 DIAGNOSIS — F0280 Dementia in other diseases classified elsewhere without behavioral disturbance: Secondary | ICD-10-CM

## 2018-11-09 DIAGNOSIS — R682 Dry mouth, unspecified: Secondary | ICD-10-CM

## 2018-11-09 NOTE — Progress Notes (Signed)
Location:  Occupational psychologist of Service:  SNF (31) Provider:   Cindi Carbon, ANP [place] [phone]  Gayland Curry, DO  Patient Care Team: Gayland Curry, DO as PCP - General (Geriatric Medicine)  Extended Emergency Contact Information Primary Emergency Contact: Farrell, [place] Mobile Phone: [phone] Relation: Son Secondary Emergency Contact: Home, Care Home Phone: [phone] Mobile Phone: [phone] Relation: Other  Code Status:  DNR Goals of care: Advanced Directive information Advanced Directives 10/09/2018  Does Patient Have a Medical Advance Directive? Yes  Type of Paramedic of [place];Out of facility DNR (pink MOST or yellow form)  Does patient want to make changes to medical advance directive? No - Patient declined  Copy of [place] in Chart? Yes - validated most recent copy scanned in chart (See row information)  Would patient like information on creating a medical advance directive? -  Pre-existing out of facility DNR order (yellow form or pink MOST form) Yellow form placed in chart (order not valid for inpatient use)     Chief Complaint  Patient presents with  . Medical Management of Chronic Issues    HPI:  Pt is a 85 y.o. male seen today for medical management of chronic diseases.   He moved to [facility] last month with his wife in the skilled care unit and has adjusted well. He was treated for a UTI last month and this has resolved.He is followed by hospice due to advanced dementia, dysphagia, and weight loss.   He was started on trazodone 3 days ago due to insomnia, it has not helped yet. Nurses report at night that he has agitation. He tries to get out of bed, disrobes, pulls off his diaper and rips it to shreds,etc. No reports of hitting or delusions.  He has a hx of dysphagia but has a waiver to have a reg diet with thin liquids. He is tolerating this but has a  chronic cough with meals. No decreased 02 sats, choking, or fever. Transfer to the chair with two person assist, incontinent. He walks short distances with assistance.  He has gained 3 lbs since arriving. Previously had issues with poor intake.  Wt Readings from Last 3 Encounters:  11/09/18 140 lb 3.2 oz (63.6 kg)  10/09/18 140 lb (63.5 kg)  07/11/18 137 lb (62.1 kg)   Past Medical History:  Diagnosis Date  . Benign meningioma ([place]) 2006   s/p parietal resection  . Benign prostatic hypertrophy   . CAD (coronary artery disease)   . Carotid artery stenosis, asymptomatic 2008   hemodynamically insignificant  . Degenerative disk disease   . Dementia ([place])   . Diverticulosis of colon 2007  . Duodenal ulcer 07/2004   NSAID induced  . Hyperlipidemia   . Hypertension 09/26/2011  . Osteoporosis   . Peripheral vascular disease ([place])   . Presbyacusis    Past Surgical History:  Procedure Laterality Date  . BRAIN SURGERY     meningioma resection  . GALLBLADDER SURGERY  2000  . SHOULDER SURGERY  2000   rotator cuff    Allergies  Allergen Reactions  . Mirtazapine Rash    Outpatient Encounter Medications as of 11/09/2018  Medication Sig  . aspirin 81 MG tablet Take 81 mg by mouth daily.   . citalopram (CELEXA) 20 MG tablet TAKE ONE TABLET BY MOUTH EVERY DAY  . famotidine (PEPCID) 20 MG tablet Take 1 tablet (20 mg total) by mouth 2 (two)  times daily.  . feeding supplement (BOOST HIGH PROTEIN) LIQD Take 1 Container by mouth daily.  . hydrocortisone cream 1 % Apply 1 application topically every 6 (six) hours as needed for itching.  Marland Kitchen ketoconazole (NIZORAL) 2 % shampoo Apply 1 application topically once a week. X 6 weeks  . LORazepam (ATIVAN) 0.5 MG tablet Take 0.5 mg by mouth 2 (two) times daily as needed for anxiety.  Marland Kitchen QUEtiapine (SEROQUEL) 25 MG tablet Take 25 mg by mouth 2 (two) times daily.  . sennosides-docusate sodium (SENOKOT-S) 8.6-50 MG tablet Take 1 tablet by mouth at bedtime.   . traZODone (DESYREL) 50 MG tablet Take 0.5 tablets (25 mg total) by mouth at bedtime.  . triamcinolone cream (KENALOG) 0.1 % Apply 1 application topically 2 (two) times daily as needed.   Marland Kitchen acetaminophen (TYLENOL) 325 MG tablet Take 650 mg by mouth every 4 (four) hours as needed.  . [DISCONTINUED] furosemide (LASIX) 20 MG tablet Take 20 mg by mouth daily as needed.   . [DISCONTINUED] hydrOXYzine (ATARAX/VISTARIL) 25 MG tablet Take 25 mg by mouth every 6 (six) hours as needed for itching.  . [DISCONTINUED] pantoprazole (PROTONIX) 40 MG tablet Take 40 mg by mouth daily.     No facility-administered encounter medications on file as of 11/09/2018.     Review of Systems  Unable to perform ROS: Dementia    Immunization History  Administered Date(s) Administered  . Influenza Split 07/04/2013  . Influenza, High Dose Seasonal PF 08/12/2015, 05/11/2016, 05/11/2017  . Influenza,inj,Quad PF,6+ Mos 06/25/2014  . Influenza-Unspecified 10/09/2018  . Pneumococcal Conjugate-13 07/28/2014  . Pneumococcal Polysaccharide-23 06/12/2013  . Zoster 08/01/2006   Pertinent  Health Maintenance Due  Topic Date Due  . OPHTHALMOLOGY EXAM  05/14/2014  . HEMOGLOBIN A1C  01/26/2015  . FOOT EXAM  07/29/2015  . URINE MICROALBUMIN  12/22/2015  . INFLUENZA VACCINE  Completed  . PNA vac Low Risk Adult  Completed   Fall Risk  10/09/2018 05/11/2017 04/05/2017 10/11/2016 05/11/2016  Falls in the past year? 1 No No No No  Number falls in past yr: 0 - - - -  Injury with Fall? 0 - - - -  Risk for fall due to : - - - - -  Follow up - - - - -   Functional Status Survey:    Vitals:   11/09/18 1018  Weight: 140 lb 3.2 oz (63.6 kg)   Body mass index is 20.12 kg/m. Physical Exam Vitals signs and nursing note reviewed.  Constitutional:      General: He is not in acute distress.    Appearance: He is not diaphoretic.  HENT:     Head: Normocephalic and atraumatic.     Mouth/Throat:     Mouth: Mucous membranes are dry.      Pharynx: Oropharynx is clear. No oropharyngeal exudate.  Eyes:     General:        Right eye: No discharge.        Left eye: No discharge.     Conjunctiva/sclera: Conjunctivae normal.     Pupils: Pupils are equal, round, and reactive to light.  Neck:     Musculoskeletal: Normal range of motion and neck supple.     Thyroid: No thyromegaly.     Vascular: No JVD.     Trachea: No tracheal deviation.  Cardiovascular:     Rate and Rhythm: Normal rate and regular rhythm.     Heart sounds: No murmur.  Pulmonary:  Effort: Pulmonary effort is normal. No respiratory distress.     Breath sounds: Normal breath sounds. No wheezing.  Abdominal:     General: Bowel sounds are normal. There is no distension.     Palpations: Abdomen is soft.     Tenderness: There is no abdominal tenderness.  Musculoskeletal:        General: No swelling or tenderness.     Right lower leg: No edema.     Left lower leg: No edema.     Comments: Rigidity to BLE.  Lymphadenopathy:     Cervical: No cervical adenopathy.  Skin:    General: Skin is warm and dry.  Neurological:     General: No focal deficit present.     Mental Status: He is alert.     Comments: Oriented to self only  Psychiatric:        Mood and Affect: Mood normal.     Labs reviewed: Recent Labs    12/17/17 1719  NA 138  K 4.2  CL 104  CO2 28  GLUCOSE 116*  BUN 27*  CREATININE 1.07  CALCIUM 9.4   No results for input(s): AST, ALT, ALKPHOS, BILITOT, PROT, ALBUMIN in the last 8760 hours. Recent Labs    12/17/17 1719  WBC 5.6  HGB 13.0  HCT 37.9*  MCV 95.4  PLT 168   Lab Results  Component Value Date   TSH 1.78 08/31/2017   Lab Results  Component Value Date   HGBA1C 6.2 07/28/2014   Lab Results  Component Value Date   CHOL 165 11/06/2013   HDL 35.40 (L) 11/06/2013   LDLCALC 119 (H) 11/06/2013   LDLDIRECT 63.0 08/12/2015   TRIG 54.0 11/06/2013   CHOLHDL 5 11/06/2013    Significant Diagnostic Results in last 30  days:  No results found.  Assessment/Plan 1. Late onset Alzheimer's disease without behavioral disturbance ([place]) Continues to have periods of restless and agitation at night If no improvement by next week will increase trazodone.   2. Primary insomnia As above  3. Diabetes mellitus controlled No recent A1C, last fasting CBG ok He is followed by hospice so we don't need to do frequent labs but annual monitoring may be indicated.   4. Dysphagia, unspecified type Waiver signed for regular diet and thin liquids for QOL Assist with meals, upright, asp prec  5. Xerostomia Biotene spray 2 sprays QID      Family/ staff Communication: staff/resident  Labs/tests ordered:  NA   D/C hydrocortisone from [facility]

## 2018-11-26 ENCOUNTER — Other Ambulatory Visit: Payer: Self-pay

## 2018-11-26 ENCOUNTER — Non-Acute Institutional Stay (SKILLED_NURSING_FACILITY): Payer: Medicare Other | Admitting: Adult Health

## 2018-11-26 ENCOUNTER — Encounter: Payer: Self-pay | Admitting: Adult Health

## 2018-11-26 DIAGNOSIS — N3000 Acute cystitis without hematuria: Secondary | ICD-10-CM

## 2018-11-26 DIAGNOSIS — G301 Alzheimer's disease with late onset: Secondary | ICD-10-CM | POA: Diagnosis not present

## 2018-11-26 DIAGNOSIS — F02818 Dementia in other diseases classified elsewhere, unspecified severity, with other behavioral disturbance: Secondary | ICD-10-CM

## 2018-11-26 DIAGNOSIS — F0281 Dementia in other diseases classified elsewhere with behavioral disturbance: Secondary | ICD-10-CM

## 2018-11-26 NOTE — Progress Notes (Signed)
Location:  Occupational psychologist of Service:  SNF (31) Provider:   Cindi Carbon, ANP [place] [phone]   Gayland Curry, DO  Patient Care Team: Gayland Curry, DO as PCP - General (Geriatric Medicine)  Extended Emergency Contact Information Primary Emergency Contact: Zak, [place] Mobile Phone: [phone] Relation: Son Secondary Emergency Contact: Home, Care Home Phone: [phone] Mobile Phone: [phone] Relation: Other  Code Status:  DNR Goals of care: Advanced Directive information Advanced Directives 10/09/2018  Does Patient Have a Medical Advance Directive? Yes  Type of Paramedic of [place];Out of facility DNR (pink MOST or yellow form)  Does patient want to make changes to medical advance directive? No - Patient declined  Copy of [place] in Chart? Yes - validated most recent copy scanned in chart (See row information)  Would patient like information on creating a medical advance directive? -  Pre-existing out of facility DNR order (yellow form or pink MOST form) Yellow form placed in chart (order not valid for inpatient use)     Chief Complaint  Patient presents with   Acute Visit    agitation at night, urinary frequency    HPI:  Pt is a 85 y.o. male seen today for an acute visit for agitation at night and urinary frequency. He is followed by hospice due to advanced dementia, dysphagia, and malnutrition. The nurse reports that he continues to experience agitation and anger, specifically in the evening and night hrs. He is not sleeping well despite upward titration of trazodone to 100 mg. He continues to get up without help and fell on 3/19 and sustained a large skin tear. He takes his clothes off frequently and appears restless at night. The nurse reports frequency and he is on day 5/7 of Keflex for a positive UA, see below. The symptom of frequency has not resolved. No fever or  abd pain reported.   11/22/18 UA with trace leuk esterase, 2+protein, neg nitrite, 3+ blood, WBC 20-30, RBC 50-70, Bacteria 3+  No predominant bacteria was isolated.   Past Medical History:  Diagnosis Date   Benign meningioma ([place]) 2006   s/p parietal resection   Benign prostatic hyperplasia    Benign prostatic hypertrophy    CAD (coronary artery disease)    Carotid artery stenosis, asymptomatic 2008   hemodynamically insignificant   Degenerative disk disease    Dementia ([place])    Diverticulosis of colon 2007   Duodenal ulcer 07/2004   NSAID induced   Hyperlipidemia    Hypertension 09/26/2011   Osteoporosis    Peripheral vascular disease ([place])    Presbyacusis    Past Surgical History:  Procedure Laterality Date   BRAIN SURGERY     meningioma resection   GALLBLADDER SURGERY  2000   SHOULDER SURGERY  2000   rotator cuff    Allergies  Allergen Reactions   Mirtazapine Rash    Outpatient Encounter Medications as of 11/26/2018  Medication Sig   antiseptic oral rinse (BIOTENE) LIQD 15 mLs by Mouth Rinse route as needed for dry mouth.   cephALEXin (KEFLEX) 500 MG capsule Take 500 mg by mouth 2 (two) times daily. UTI   Mouthwashes (BIOTENE DRY MOUTH MT) Use as directed 2 sprays in the mouth or throat 4 (four) times daily.   acetaminophen (TYLENOL) 325 MG tablet Take 650 mg by mouth every 4 (four) hours as needed.   aspirin 81 MG tablet Take 81 mg by mouth daily.  citalopram (CELEXA) 20 MG tablet TAKE ONE TABLET BY MOUTH EVERY DAY   famotidine (PEPCID) 20 MG tablet Take 1 tablet (20 mg total) by mouth 2 (two) times daily.   feeding supplement (BOOST HIGH PROTEIN) LIQD Take 1 Container by mouth daily.   ketoconazole (NIZORAL) 2 % shampoo Apply 1 application topically once a week. X 6 weeks   LORazepam (ATIVAN) 0.5 MG tablet Take 0.5 mg by mouth 2 (two) times daily as needed for anxiety.   QUEtiapine (SEROQUEL) 25 MG tablet Take 25 mg by mouth 2 (two)  times daily.   sennosides-docusate sodium (SENOKOT-S) 8.6-50 MG tablet Take 1 tablet by mouth at bedtime.   traZODone (DESYREL) 50 MG tablet Take 0.5 tablets (25 mg total) by mouth at bedtime. (Patient taking differently: Take 100 mg by mouth at bedtime. )   triamcinolone cream (KENALOG) 0.1 % Apply 1 application topically 2 (two) times daily as needed.    [DISCONTINUED] hydrocortisone cream 1 % Apply 1 application topically every 6 (six) hours as needed for itching.   [DISCONTINUED] pantoprazole (PROTONIX) 40 MG tablet Take 40 mg by mouth daily.     No facility-administered encounter medications on file as of 11/26/2018.     Review of Systems  Unable to perform ROS: Dementia    Immunization History  Administered Date(s) Administered   Influenza Split 07/04/2013   Influenza, High Dose Seasonal PF 08/12/2015, 05/11/2016, 05/11/2017   Influenza,inj,Quad PF,6+ Mos 06/25/2014   Influenza-Unspecified 10/09/2018   Pneumococcal Conjugate-13 07/28/2014   Pneumococcal Polysaccharide-23 06/12/2013   Zoster 08/01/2006   Pertinent  Health Maintenance Due  Topic Date Due   OPHTHALMOLOGY EXAM  05/14/2014   HEMOGLOBIN A1C  01/26/2015   FOOT EXAM  07/29/2015   URINE MICROALBUMIN  12/10/2018 (Originally 12/22/2015)   INFLUENZA VACCINE  Completed   PNA vac Low Risk Adult  Completed   Fall Risk  10/09/2018 05/11/2017 04/05/2017 10/11/2016 05/11/2016  Falls in the past year? 1 No No No No  Number falls in past yr: 0 - - - -  Injury with Fall? 0 - - - -  Risk for fall due to : - - - - -  Follow up - - - - -   Functional Status Survey:    Vitals:   11/26/18 1050  Pulse: (!) 52  Resp: 17  Temp: 97.7 F (36.5 C)  SpO2: 98%   There is no height or weight on file to calculate BMI. Physical Exam Vitals signs and nursing note reviewed.  Constitutional:      Comments: Frail thin male  Pulmonary:     Effort: No respiratory distress.  Abdominal:     General: Abdomen is flat. There  is no distension.  Neurological:     General: No focal deficit present.     Mental Status: He is alert. Mental status is at baseline.     Comments: Oriented to self only  Psychiatric:        Mood and Affect: Mood normal.     Labs reviewed: Recent Labs    12/17/17 1719  NA 138  K 4.2  CL 104  CO2 28  GLUCOSE 116*  BUN 27*  CREATININE 1.07  CALCIUM 9.4   No results for input(s): AST, ALT, ALKPHOS, BILITOT, PROT, ALBUMIN in the last 8760 hours. Recent Labs    12/17/17 1719  WBC 5.6  HGB 13.0  HCT 37.9*  MCV 95.4  PLT 168   Lab Results  Component Value Date  TSH 1.78 08/31/2017   Lab Results  Component Value Date   HGBA1C 6.2 07/28/2014   Lab Results  Component Value Date   CHOL 165 11/06/2013   HDL 35.40 (L) 11/06/2013   LDLCALC 119 (H) 11/06/2013   LDLDIRECT 63.0 08/12/2015   TRIG 54.0 11/06/2013   CHOLHDL 5 11/06/2013    Significant Diagnostic Results in last 30 days:  No results found.  Assessment/Plan 1. Acute cystitis without hematuria Check bladder scan after wet brief due to continue frequency and consider incomplete bladder emptying as the cause of his symptom.  Complete 7 day course of keflex  2. Late onset Alzheimer's disease with behavioral disturbance ([facility]) Taper off trazodone due to inefficacy Increase seroquel to 25 mg in the am and 50 mg in the evening    Family/ staff Communication: discussed with nurse/hospice  Labs/tests ordered:  Bladder scan

## 2018-11-27 ENCOUNTER — Encounter: Payer: Self-pay | Admitting: Internal Medicine

## 2018-11-27 ENCOUNTER — Non-Acute Institutional Stay (SKILLED_NURSING_FACILITY): Payer: Medicare Other | Admitting: Internal Medicine

## 2018-11-27 DIAGNOSIS — F02818 Dementia in other diseases classified elsewhere, unspecified severity, with other behavioral disturbance: Secondary | ICD-10-CM

## 2018-11-27 DIAGNOSIS — F0281 Dementia in other diseases classified elsewhere with behavioral disturbance: Secondary | ICD-10-CM

## 2018-11-27 DIAGNOSIS — G301 Alzheimer's disease with late onset: Secondary | ICD-10-CM | POA: Diagnosis not present

## 2018-11-27 DIAGNOSIS — N3281 Overactive bladder: Secondary | ICD-10-CM | POA: Diagnosis not present

## 2018-11-27 DIAGNOSIS — N3000 Acute cystitis without hematuria: Secondary | ICD-10-CM

## 2018-11-27 NOTE — Progress Notes (Signed)
Patient ID: Derrick Sullivan, male   DOB: May 13, 1924, 85 y.o.   MRN: [id number]  Location:  [facility] Room Number: 106 Place of Service:  SNF (31) Provider:   Gayland Curry, DO  Patient Care Team: Gayland Curry, DO as PCP - General (Geriatric Medicine)  Extended Emergency Contact Information Primary Emergency Contact: Burech, Mcfarland Mobile Phone: [phone] Relation: Son Secondary Emergency Contact: Home, Care Home Phone: [phone] Mobile Phone: [phone] Relation: Other  Code Status:  DNR, hospice Goals of care: Advanced Directive information Advanced Directives 11/27/2018  Does Patient Have a Medical Advance Directive? Yes  Type of Paramedic of [place];Out of facility DNR (pink MOST or yellow form)  Does patient want to make changes to medical advance directive? No - Patient declined  Copy of [place] in Chart? Yes - validated most recent copy scanned in chart (See row information)  Would patient like information on creating a medical advance directive? -  Pre-existing out of facility DNR order (yellow form or pink MOST form) Yellow form placed in chart (order not valid for inpatient use)     Chief Complaint  Patient presents with  . Acute Visit    Urinary Frequency    HPI:  Pt is a 85 y.o. male seen today for an acute visit for urinary frequency.  Pt already being treated for acute cystitis with keflex, but has had ongoing issues of urinary frequency.  He had just a small amount of retention after a wet depends of 205 cc.  NP wondered about next step in treatment.  Pt has been spraying the room with his urine frequently throughout the day, has foul odor to urine despite hydration.  He's been more agitated and upset at times.  Past Medical History:  Diagnosis Date  . Benign meningioma ([place]) 2006   s/p parietal resection  . Benign prostatic hyperplasia   . Benign prostatic hypertrophy    . CAD (coronary artery disease)   . Carotid artery stenosis, asymptomatic 2008   hemodynamically insignificant  . Degenerative disk disease   . Dementia ([place])   . Diverticulosis of colon 2007  . Duodenal ulcer 07/2004   NSAID induced  . Hyperlipidemia   . Hypertension 09/26/2011  . Osteoporosis   . Peripheral vascular disease ([place])   . Presbyacusis    Past Surgical History:  Procedure Laterality Date  . BRAIN SURGERY     meningioma resection  . GALLBLADDER SURGERY  2000  . SHOULDER SURGERY  2000   rotator cuff    Allergies  Allergen Reactions  . Mirtazapine Rash    Outpatient Encounter Medications as of 11/27/2018  Medication Sig  . acetaminophen (TYLENOL) 500 MG tablet Take 500 mg by mouth 3 (three) times daily as needed.  Marland Kitchen antiseptic oral rinse (BIOTENE) LIQD 15 mLs by Mouth Rinse route as needed for dry mouth.  Marland Kitchen aspirin 81 MG tablet Take 81 mg by mouth daily.   . cephALEXin (KEFLEX) 500 MG capsule Take 500 mg by mouth 2 (two) times daily. UTI  . citalopram (CELEXA) 20 MG tablet TAKE ONE TABLET BY MOUTH EVERY DAY  . famotidine (PEPCID) 20 MG tablet Take 1 tablet (20 mg total) by mouth 2 (two) times daily.  . feeding supplement (BOOST HIGH PROTEIN) LIQD Take 1 Container by mouth daily.  Marland Kitchen ketoconazole (NIZORAL) 2 % shampoo Apply 1 application topically once a week. X 6 weeks  . LORazepam (ATIVAN) 0.5 MG tablet Take  0.5 mg by mouth 2 (two) times daily as needed for anxiety.  . Mouthwashes (BIOTENE DRY MOUTH MT) Use as directed 2 sprays in the mouth or throat 4 (four) times daily.  . QUEtiapine (SEROQUEL) 25 MG tablet Take 25 mg by mouth 2 (two) times daily.  . sennosides-docusate sodium (SENOKOT-S) 8.6-50 MG tablet Take 1 tablet by mouth at bedtime.  . traZODone (DESYREL) 50 MG tablet Take 50 mg by mouth at bedtime. Taper x1 week  . [START ON 12/03/2018] traZODone (DESYREL) 50 MG tablet Take 25 mg by mouth at bedtime. Taper x1 week  . triamcinolone cream (KENALOG) 0.1 %  Apply 1 application topically 2 (two) times daily as needed.   . [DISCONTINUED] acetaminophen (TYLENOL) 325 MG tablet Take 650 mg by mouth every 4 (four) hours as needed.  . [DISCONTINUED] pantoprazole (PROTONIX) 40 MG tablet Take 40 mg by mouth daily.    . [DISCONTINUED] traZODone (DESYREL) 50 MG tablet Take 0.5 tablets (25 mg total) by mouth at bedtime. (Patient taking differently: Take 100 mg by mouth at bedtime. )   No facility-administered encounter medications on file as of 11/27/2018.     Review of Systems  Constitutional: Negative for activity change, appetite change and fever.  HENT: Negative for congestion.   Respiratory: Negative for cough and shortness of breath.   Gastrointestinal: Negative for abdominal pain.  Endocrine: Positive for polyuria.  Genitourinary: Positive for frequency and urgency. Negative for decreased urine volume, difficulty urinating, dysuria and flank pain.  Musculoskeletal: Positive for gait problem.       Uses manual wheelchair  Skin: Negative for color change.  Neurological: Positive for weakness. Negative for dizziness.  Psychiatric/Behavioral: Positive for behavioral problems and confusion.    Immunization History  Administered Date(s) Administered  . Influenza Split 07/04/2013  . Influenza, High Dose Seasonal PF 08/12/2015, 05/11/2016, 05/11/2017  . Influenza,inj,Quad PF,6+ Mos 06/25/2014  . Influenza-Unspecified 10/09/2018  . Pneumococcal Conjugate-13 07/28/2014  . Pneumococcal Polysaccharide-23 06/12/2013  . Zoster 08/01/2006   Pertinent  Health Maintenance Due  Topic Date Due  . OPHTHALMOLOGY EXAM  05/14/2014  . HEMOGLOBIN A1C  01/26/2015  . FOOT EXAM  07/29/2015  . URINE MICROALBUMIN  12/10/2018 (Originally 12/22/2015)  . INFLUENZA VACCINE  Completed  . PNA vac Low Risk Adult  Completed   Fall Risk  10/09/2018 05/11/2017 04/05/2017 10/11/2016 05/11/2016  Falls in the past year? 1 No No No No  Number falls in past yr: 0 - - - -  Injury with  Fall? 0 - - - -  Risk for fall due to : - - - - -  Follow up - - - - -   Functional Status Survey:    Vitals:   11/27/18 0941  BP: 137/86  Pulse: 78  Resp: 17  Temp: (!) 96.8 F (36 C)  TempSrc: Oral  SpO2: 98%  Weight: 134 lb (60.8 kg)  Height: 5\' 10"  (1.778 m)   Body mass index is 19.23 kg/m. Physical Exam Constitutional:      Comments: Frail white male seated in manual wheelchair, strong urine smell despite staff frequently performing personal hygiene for him   Eyes:     Comments: glasses  Cardiovascular:     Rate and Rhythm: Normal rate and regular rhythm.     Heart sounds: Murmur present.  Pulmonary:     Effort: Pulmonary effort is normal.     Breath sounds: Normal breath sounds.  Musculoskeletal: Normal range of motion.  Skin:  General: Skin is warm and dry.  Neurological:     Mental Status: He is alert.     Comments: Alert, converses, but unable to give appropriate answers to questions     Labs reviewed: Recent Labs    12/17/17 1719  NA 138  K 4.2  CL 104  CO2 28  GLUCOSE 116*  BUN 27*  CREATININE 1.07  CALCIUM 9.4   No results for input(s): AST, ALT, ALKPHOS, BILITOT, PROT, ALBUMIN in the last 8760 hours. Recent Labs    12/17/17 1719  WBC 5.6  HGB 13.0  HCT 37.9*  MCV 95.4  PLT 168   Lab Results  Component Value Date   TSH 1.78 08/31/2017   Lab Results  Component Value Date   HGBA1C 6.2 07/28/2014   Lab Results  Component Value Date   CHOL 165 11/06/2013   HDL 35.40 (L) 11/06/2013   LDLCALC 119 (H) 11/06/2013   LDLDIRECT 63.0 08/12/2015   TRIG 54.0 11/06/2013   CHOLHDL 5 11/06/2013    Assessment/Plan 1. Acute cystitis without hematuria -complete keflex course--appears he does not truly have a UTI, but UA was grossly positive with mixed bacteria  2. Overactive bladder -will also start him on myrbetriq 25mg  po daily  -check bladder ultrasound if there is no urine output in 6 hrs--monitor this for the next 72 hrs  3.  Late onset Alzheimer's disease with behavioral disturbance ([place]) -increased behaviors recently made [place] concerned for true infection, but urine cx wound up negative (colonized)  Family/ staff Communication: discussed with nurse manager  Labs/tests ordered:  Bladder scanning if no output noted in 6 hrs  Treyten Monestime L. Musette Kisamore, D.O. [facility] [address], [place] [address] Cell Phone (Mon-Fri 8am-5pm):  [phone] On Call:  [phone] & follow prompts after 5pm & weekends Office Phone:  [phone] Office Fax:  [phone]

## 2018-12-03 ENCOUNTER — Encounter: Payer: Self-pay | Admitting: Adult Health

## 2018-12-03 ENCOUNTER — Non-Acute Institutional Stay (SKILLED_NURSING_FACILITY): Payer: Medicare Other | Admitting: Adult Health

## 2018-12-03 DIAGNOSIS — G301 Alzheimer's disease with late onset: Secondary | ICD-10-CM

## 2018-12-03 DIAGNOSIS — F0281 Dementia in other diseases classified elsewhere with behavioral disturbance: Secondary | ICD-10-CM

## 2018-12-03 DIAGNOSIS — R350 Frequency of micturition: Secondary | ICD-10-CM | POA: Diagnosis not present

## 2018-12-03 NOTE — Progress Notes (Signed)
Location:  [facility] of Service:  SNF (31) Provider:   Cindi Carbon, ANP [facility] [phone]   Gayland Curry, DO  Patient Care Team: Gayland Curry, DO as PCP - General (Geriatric Medicine)  Extended Emergency Contact Information Primary Emergency Contact: Roniel, Halloran Mobile Phone: [phone] Relation: Son Secondary Emergency Contact: Home, Care Home Phone: [phone] Mobile Phone: [phone] Relation: Other  Code Status:  DNR Goals of care: Advanced Directive information Advanced Directives 11/27/2018  Does Patient Have a Medical Advance Directive? Yes  Type of Paramedic of [place];Out of facility DNR (pink MOST or yellow form)  Does patient want to make changes to medical advance directive? No - Patient declined  Copy of [place] in Chart? Yes - validated most recent copy scanned in chart (See row information)  Would patient like information on creating a medical advance directive? -  Pre-existing out of facility DNR order (yellow form or pink MOST form) Yellow form placed in chart (order not valid for inpatient use)     Chief Complaint  Patient presents with  . Acute Visit    frequency, agitation    HPI:  Pt is a 85 y.o. male seen today for an acute visit for urinary frequency. Treated for UTI due to positive UA on 3/19 but no specific organism identified on culture He was started on myrbetriq due to urinary frequency last week. The nurse reports that his frequency has worsened and he is more agitated. He is urinating large amts and does not appear to have issues releasing urine. No abd pain, distention, or fever.  He has a hx of dementia and continues to be agitated. He makes attempts to get out of bed, hits, kicks, and becomes easily angered. Seroquel was increased last week but there has been no benefit at this point.   Past Medical History:  Diagnosis Date  .  Benign meningioma ([place]) 2006   s/p parietal resection  . Benign prostatic hyperplasia   . Benign prostatic hypertrophy   . CAD (coronary artery disease)   . Carotid artery stenosis, asymptomatic 2008   hemodynamically insignificant  . Degenerative disk disease   . Dementia ([place])   . Diverticulosis of colon 2007  . Duodenal ulcer 07/2004   NSAID induced  . Hyperlipidemia   . Hypertension 09/26/2011  . Osteoporosis   . Peripheral vascular disease ([place])   . Presbyacusis    Past Surgical History:  Procedure Laterality Date  . BRAIN SURGERY     meningioma resection  . GALLBLADDER SURGERY  2000  . SHOULDER SURGERY  2000   rotator cuff    Allergies  Allergen Reactions  . Mirtazapine Rash    Outpatient Encounter Medications as of 12/03/2018  Medication Sig  . mirabegron ER (MYRBETRIQ) 25 MG TB24 tablet Take 25 mg by mouth daily.  Marland Kitchen acetaminophen (TYLENOL) 500 MG tablet Take 500 mg by mouth 3 (three) times daily as needed.  Marland Kitchen antiseptic oral rinse (BIOTENE) LIQD 15 mLs by Mouth Rinse route as needed for dry mouth.  Marland Kitchen aspirin 81 MG tablet Take 81 mg by mouth daily.   . citalopram (CELEXA) 20 MG tablet TAKE ONE TABLET BY MOUTH EVERY DAY  . famotidine (PEPCID) 20 MG tablet Take 1 tablet (20 mg total) by mouth 2 (two) times daily.  . feeding supplement (BOOST HIGH PROTEIN) LIQD Take 1 Container by mouth daily.  Marland Kitchen ketoconazole (NIZORAL) 2 % shampoo Apply 1  application topically once a week. X 6 weeks  . LORazepam (ATIVAN) 0.5 MG tablet Take 0.5 mg by mouth 2 (two) times daily as needed for anxiety.  . Mouthwashes (BIOTENE DRY MOUTH MT) Use as directed 2 sprays in the mouth or throat 4 (four) times daily.  . QUEtiapine (SEROQUEL) 25 MG tablet Take 25 mg by mouth 2 (two) times daily. 25 mg in am and 50 mg in the pm  . sennosides-docusate sodium (SENOKOT-S) 8.6-50 MG tablet Take 1 tablet by mouth at bedtime.  . triamcinolone cream (KENALOG) 0.1 % Apply 1 application topically 2 (two) times  daily as needed.   . [DISCONTINUED] pantoprazole (PROTONIX) 40 MG tablet Take 40 mg by mouth daily.    . [DISCONTINUED] traZODone (DESYREL) 50 MG tablet Take 25 mg by mouth at bedtime. Taper x1 week   No facility-administered encounter medications on file as of 12/03/2018.     Review of Systems  Unable to perform ROS: Dementia    Immunization History  Administered Date(s) Administered  . Influenza Split 07/04/2013  . Influenza, High Dose Seasonal PF 08/12/2015, 05/11/2016, 05/11/2017  . Influenza,inj,Quad PF,6+ Mos 06/25/2014  . Influenza-Unspecified 10/09/2018  . Pneumococcal Conjugate-13 07/28/2014  . Pneumococcal Polysaccharide-23 06/12/2013  . Zoster 08/01/2006   Pertinent  Health Maintenance Due  Topic Date Due  . OPHTHALMOLOGY EXAM  05/14/2014  . HEMOGLOBIN A1C  01/26/2015  . FOOT EXAM  07/29/2015  . URINE MICROALBUMIN  12/10/2018 (Originally 12/22/2015)  . INFLUENZA VACCINE  Completed  . PNA vac Low Risk Adult  Completed   Fall Risk  10/09/2018 05/11/2017 04/05/2017 10/11/2016 05/11/2016  Falls in the past year? 1 No No No No  Number falls in past yr: 0 - - - -  Injury with Fall? 0 - - - -  Risk for fall due to : - - - - -  Follow up - - - - -   Functional Status Survey:    Vitals:   12/03/18 1431  BP: (!) 149/56   There is no height or weight on file to calculate BMI. Physical Exam Nursing note reviewed.  Constitutional:      General: He is not in acute distress.    Comments: Sleepy but arouses easily and becomes agitated  Pulmonary:     Effort: Pulmonary effort is normal. No respiratory distress.  Abdominal:     General: Abdomen is flat. Bowel sounds are normal. There is no distension.     Palpations: Abdomen is soft.     Tenderness: There is no abdominal tenderness.  Skin:    General: Skin is warm and dry.  Neurological:     General: No focal deficit present.     Mental Status: Mental status is at baseline.     Labs reviewed: Recent Labs    12/17/17  1719  NA 138  K 4.2  CL 104  CO2 28  GLUCOSE 116*  BUN 27*  CREATININE 1.07  CALCIUM 9.4   No results for input(s): AST, ALT, ALKPHOS, BILITOT, PROT, ALBUMIN in the last 8760 hours. Recent Labs    12/17/17 1719  WBC 5.6  HGB 13.0  HCT 37.9*  MCV 95.4  PLT 168   Lab Results  Component Value Date   TSH 1.78 08/31/2017   Lab Results  Component Value Date   HGBA1C 6.2 07/28/2014   Lab Results  Component Value Date   CHOL 165 11/06/2013   HDL 35.40 (L) 11/06/2013   LDLCALC 119 (H) 11/06/2013  LDLDIRECT 63.0 08/12/2015   TRIG 54.0 11/06/2013   CHOLHDL 5 11/06/2013    Significant Diagnostic Results in last 30 days:  No results found.  Assessment/Plan  1. Urinary frequency Discontinue myrbetriq due to worsening frequency  Start Flomax 0.4 mg qhs hold for SBP <95  2. Late onset Alzheimer's disease with behavioral disturbance ([place]) Continues with periods of combativeness and agitation Continue ativan 0.5 mg bid prn Increase Seroquel 50 mg bid    Family/ staff Communication: discussed with staff  Labs/tests ordered:  NA

## 2018-12-10 ENCOUNTER — Encounter: Payer: Self-pay | Admitting: Adult Health

## 2018-12-10 ENCOUNTER — Non-Acute Institutional Stay (SKILLED_NURSING_FACILITY): Payer: Medicare Other | Admitting: Adult Health

## 2018-12-10 DIAGNOSIS — N401 Enlarged prostate with lower urinary tract symptoms: Secondary | ICD-10-CM | POA: Diagnosis not present

## 2018-12-10 DIAGNOSIS — G301 Alzheimer's disease with late onset: Secondary | ICD-10-CM

## 2018-12-10 DIAGNOSIS — Z7189 Other specified counseling: Secondary | ICD-10-CM

## 2018-12-10 DIAGNOSIS — R1312 Dysphagia, oropharyngeal phase: Secondary | ICD-10-CM | POA: Diagnosis not present

## 2018-12-10 DIAGNOSIS — R350 Frequency of micturition: Secondary | ICD-10-CM

## 2018-12-10 DIAGNOSIS — F0281 Dementia in other diseases classified elsewhere with behavioral disturbance: Secondary | ICD-10-CM

## 2018-12-10 MED ORDER — ALPRAZOLAM 0.5 MG PO TABS: 0.5000 mg | ORAL_TABLET | Freq: Two times a day (BID) | ORAL | 0 refills | Status: AC

## 2018-12-10 MED ORDER — ALPRAZOLAM 0.5 MG PO TABS: 0.5000 mg | ORAL_TABLET | Freq: Three times a day (TID) | ORAL | 0 refills | Status: AC | PRN

## 2018-12-10 MED ORDER — MORPHINE SULFATE (CONCENTRATE) 20 MG/ML PO SOLN: 5.0000 mg | Freq: Three times a day (TID) | ORAL | 0 refills | Status: DC | PRN

## 2018-12-10 NOTE — Progress Notes (Signed)
Location:  Occupational psychologist of Service:  SNF (31) Provider:   Cindi Carbon, ANP [place] [phone]   Gayland Curry, DO  Patient Care Team: Gayland Curry, DO as PCP - General (Geriatric Medicine)  Extended Emergency Contact Information Primary Emergency Contact: Jamile, Sivils Mobile Phone: [phone] Relation: Son Secondary Emergency Contact: Home, Care Home Phone: [phone] Mobile Phone: [phone] Relation: Other  Code Status:  DNR Goals of care: Advanced Directive information Advanced Directives 11/27/2018  Does Patient Have a Medical Advance Directive? Yes  Type of Paramedic of [place];Out of facility DNR (pink MOST or yellow form)  Does patient want to make changes to medical advance directive? No - Patient declined  Copy of [place] in Chart? Yes - validated most recent copy scanned in chart (See row information)  Would patient like information on creating a medical advance directive? -  Pre-existing out of facility DNR order (yellow form or pink MOST form) Yellow form placed in chart (order not valid for inpatient use)     Chief Complaint  Patient presents with   Acute Visit    not eating,agitation    HPI:  Pt is a 85 y.o. male seen today for an acute visit for not eating and agitation. Mr. Genova has severe AD with behaviors. He continues with periods of lethargy and periods of wakefulness. When he is awake he appears uncomfortable. He hits and kicks the staff during personal care and appears anxious. He has reduced intake and dysphagia. He has a chronic cough with meals,even on a modified diet. He was placed on Flomax for urinary retention due to bph which has helped some but he continues to have urinary frequency.  His nurse reported that he seems to be declining physically and mentally. His wife is concerned as well. He is followed by hospice for advanced dementia and  dysphagia. He has not had a fever or chills or worsening cough, however, they were not able to get a weight or vitals this morning due to agitation.    Past Medical History:  Diagnosis Date   Benign meningioma ([place]) 2006   s/p parietal resection   Benign prostatic hyperplasia    Benign prostatic hypertrophy    CAD (coronary artery disease)    Carotid artery stenosis, asymptomatic 2008   hemodynamically insignificant   Degenerative disk disease    Dementia ([place])    Diverticulosis of colon 2007   Duodenal ulcer 07/2004   NSAID induced   Hyperlipidemia    Hypertension 09/26/2011   Osteoporosis    Peripheral vascular disease ([place])    Presbyacusis    Past Surgical History:  Procedure Laterality Date   BRAIN SURGERY     meningioma resection   GALLBLADDER SURGERY  2000   SHOULDER SURGERY  2000   rotator cuff    Allergies  Allergen Reactions   Mirtazapine Rash    Outpatient Encounter Medications as of 12/10/2018  Medication Sig   ALPRAZolam (XANAX) 0.5 MG tablet Take 1 tablet (0.5 mg total) by mouth 2 (two) times daily.   ALPRAZolam (XANAX) 0.5 MG tablet Take 1 tablet (0.5 mg total) by mouth every 8 (eight) hours as needed for anxiety.   morphine (ROXANOL) 20 MG/ML concentrated solution Take 0.25 mLs (5 mg total) by mouth every 8 (eight) hours as needed for severe pain.   QUEtiapine (SEROQUEL) 50 MG tablet Take 50 mg by mouth 2 (two) times daily.   tamsulosin (  FLOMAX) 0.4 MG CAPS capsule Take 0.4 mg by mouth daily after supper.   [DISCONTINUED] ALPRAZolam (XANAX) 0.5 MG tablet Take 0.5 mg by mouth 2 (two) times daily.   [DISCONTINUED] ALPRAZolam (XANAX) 0.5 MG tablet Take 0.5 mg by mouth every 8 (eight) hours as needed for anxiety.   [DISCONTINUED] morphine (ROXANOL) 20 MG/ML concentrated solution Take by mouth every 8 (eight) hours as needed for severe pain.   acetaminophen (TYLENOL) 500 MG tablet Take 500 mg by mouth 3 (three) times daily as needed.     antiseptic oral rinse (BIOTENE) LIQD 15 mLs by Mouth Rinse route as needed for dry mouth.   aspirin 81 MG tablet Take 81 mg by mouth daily.    citalopram (CELEXA) 20 MG tablet TAKE ONE TABLET BY MOUTH EVERY DAY   famotidine (PEPCID) 20 MG tablet Take 1 tablet (20 mg total) by mouth 2 (two) times daily.   feeding supplement (BOOST HIGH PROTEIN) LIQD Take 1 Container by mouth daily.   ketoconazole (NIZORAL) 2 % shampoo Apply 1 application topically once a week. X 6 weeks   mirabegron ER (MYRBETRIQ) 25 MG TB24 tablet Take 25 mg by mouth daily.   Mouthwashes (BIOTENE DRY MOUTH MT) Use as directed 2 sprays in the mouth or throat 4 (four) times daily.   sennosides-docusate sodium (SENOKOT-S) 8.6-50 MG tablet Take 1 tablet by mouth at bedtime.   triamcinolone cream (KENALOG) 0.1 % Apply 1 application topically 2 (two) times daily as needed.    [DISCONTINUED] LORazepam (ATIVAN) 0.5 MG tablet Take 0.5 mg by mouth 2 (two) times daily as needed for anxiety.   [DISCONTINUED] pantoprazole (PROTONIX) 40 MG tablet Take 40 mg by mouth daily.     [DISCONTINUED] QUEtiapine (SEROQUEL) 25 MG tablet Take 25 mg by mouth 2 (two) times daily. 25 mg in am and 50 mg in the pm   No facility-administered encounter medications on file as of 12/10/2018.     Review of Systems  Unable to perform ROS: Dementia    Immunization History  Administered Date(s) Administered   Influenza Split 07/04/2013   Influenza, High Dose Seasonal PF 08/12/2015, 05/11/2016, 05/11/2017   Influenza,inj,Quad PF,6+ Mos 06/25/2014   Influenza-Unspecified 10/09/2018   Pneumococcal Conjugate-13 07/28/2014   Pneumococcal Polysaccharide-23 06/12/2013   Zoster 08/01/2006   Pertinent  Health Maintenance Due  Topic Date Due   OPHTHALMOLOGY EXAM  05/14/2014   HEMOGLOBIN A1C  01/26/2015   FOOT EXAM  07/29/2015   URINE MICROALBUMIN  12/10/2018 (Originally 12/22/2015)   INFLUENZA VACCINE  04/06/2019   PNA vac Low Risk  Adult  Completed   Fall Risk  10/09/2018 05/11/2017 04/05/2017 10/11/2016 05/11/2016  Falls in the past year? 1 No No No No  Number falls in past yr: 0 - - - -  Injury with Fall? 0 - - - -  Risk for fall due to : - - - - -  Follow up - - - - -   Functional Status Survey:    There were no vitals filed for this visit. There is no height or weight on file to calculate BMI. Physical Exam Vitals signs and nursing note reviewed.  Constitutional:      Comments: Asleep but awakes and becomes agitated once he notices I am in the room. Thin and frail.  HENT:     Head: Atraumatic.     Nose: Nose normal. No congestion.  Cardiovascular:     Rate and Rhythm: Normal rate and regular rhythm.  Pulmonary:  Effort: Pulmonary effort is normal. No respiratory distress.     Breath sounds: Normal breath sounds.  Abdominal:     General: Bowel sounds are normal. There is no distension.     Palpations: Abdomen is soft.     Tenderness: There is no right CVA tenderness or left CVA tenderness.  Musculoskeletal:     Right lower leg: No edema.     Left lower leg: No edema.  Skin:    General: Skin is warm and dry.  Neurological:     General: No focal deficit present.     Comments: Confused, not able to follow commands. Combative     Labs reviewed: Recent Labs    12/17/17 1719  NA 138  K 4.2  CL 104  CO2 28  GLUCOSE 116*  BUN 27*  CREATININE 1.07  CALCIUM 9.4   No results for input(s): AST, ALT, ALKPHOS, BILITOT, PROT, ALBUMIN in the last 8760 hours. Recent Labs    12/17/17 1719  WBC 5.6  HGB 13.0  HCT 37.9*  MCV 95.4  PLT 168   Lab Results  Component Value Date   TSH 1.78 08/31/2017   Lab Results  Component Value Date   HGBA1C 6.2 07/28/2014   Lab Results  Component Value Date   CHOL 165 11/06/2013   HDL 35.40 (L) 11/06/2013   LDLCALC 119 (H) 11/06/2013   LDLDIRECT 63.0 08/12/2015   TRIG 54.0 11/06/2013   CHOLHDL 5 11/06/2013    Significant Diagnostic Results in last 30  days:  No results found.  Assessment/Plan 1. Late onset Alzheimer's disease with behavioral disturbance (HCC) Severe with progression noted. Eating less, more lethargic, worsening aggression. Continues with periods of agitation and perceived discomfort noted by staff Ativan discontinued as it seems to cause more agitation Begin Xanax 0.5 mg bid and 0.5 mg q 8 prn Roxanol 5 mg q 8 prn pain/sob Continue seroquel at 50 mg bid for now.   2. Benign prostatic hyperplasia with urinary frequency He was started on Flomax and is not noted to have retention at this point. He continues to have frequency and appears uncomfortable. See below.  3. Oropharyngeal dysphagia Continue modified diet and asp prec.   4. Advanced care planning/counseling discussion I called and spoke with his [place]. I also spoke with his wife [place]. Mr. Shipley has progression dementia, agitation, dysphagia, and a reduced quality of life. He spends much of the day either asleep or agitated/uncomfortable. He is eating less and has dysphagia. He is declining and nearing the end of life. I described the situation to [facility] and she agreed that we should make him comfortable and avoid treatment for additional infections. We will begin morphine as needed and scheduled xanax for comfort. An order has been written to avoid hospitalizations, labs, antibiotics, and IVs. His goals of care are comfort based only at this point. He is followed by hospice.     Family/ staff Communication: Clelia Croft, and his wife [place]  Labs/tests ordered:  NA

## 2018-12-10 NOTE — ACP (Advance Care Planning) (Signed)
I called and spoke with his Derrick Sullivan. I also spoke with his wife Derrick Sullivan. Derrick Sullivan has progression dementia, agitation, dysphagia, and a reduced quality of life. He spends much of the day either asleep or agitated/uncomfortable. He is eating less and has dysphagia. He is declining and nearing the end of life. I described the situation to [facility] and she agreed that we should make him comfortable and avoid treatment for additional infections. We will begin morphine as needed and scheduled xanax for comfort. An order has been written to avoid hospitalizations, labs, antibiotics, and IVs. His goals of care are comfort based only at this point. He is followed by hospice.

## 2018-12-12 ENCOUNTER — Other Ambulatory Visit: Payer: Self-pay | Admitting: Internal Medicine

## 2018-12-12 DIAGNOSIS — G8929 Other chronic pain: Secondary | ICD-10-CM

## 2018-12-12 DIAGNOSIS — M545 Low back pain, unspecified: Secondary | ICD-10-CM

## 2018-12-12 MED ORDER — MORPHINE SULFATE (CONCENTRATE) 20 MG/ML PO SOLN: 5.0000 mg | ORAL | 0 refills | Status: AC | PRN

## 2019-01-04 DEATH — deceased

## 2019-04-02 IMAGING — CT CT HEAD W/O CM
4 of 5 series · 15 of 47 positions shown, 16 images · non-contrast
Comparison: 06/10/2013

CLINICAL DATA: Dementia, near fall today, dizziness, fell last week

EXAM:
CT HEAD WITHOUT CONTRAST
TECHNIQUE: Contiguous axial images were obtained from the base of the skull
through the vertex without intravenous contrast. Sagittal and
coronal MPR images reconstructed from axial data set.

[Series 2: head wo · axial · 0.47mm/px · z∈[-121,-36]mm · 4 of 29 slices shown, 5 images (1 of 2)]
[im 6/29  brain]
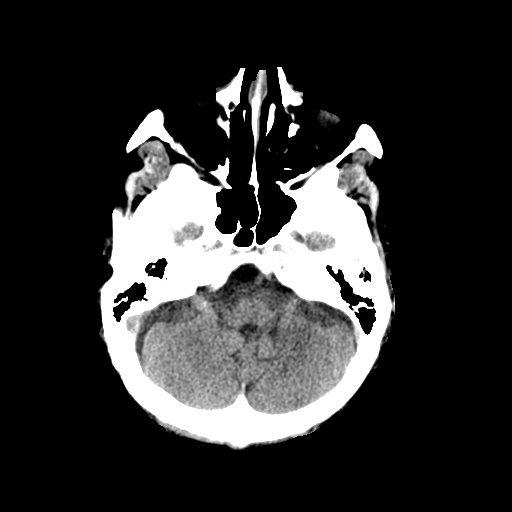
[im 6/29  bone]
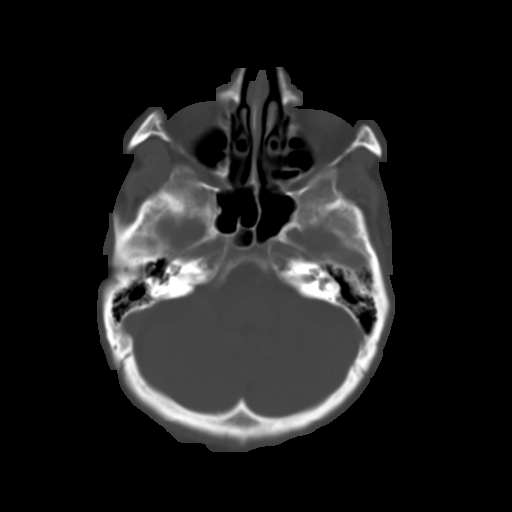
[im 12/29  brain]
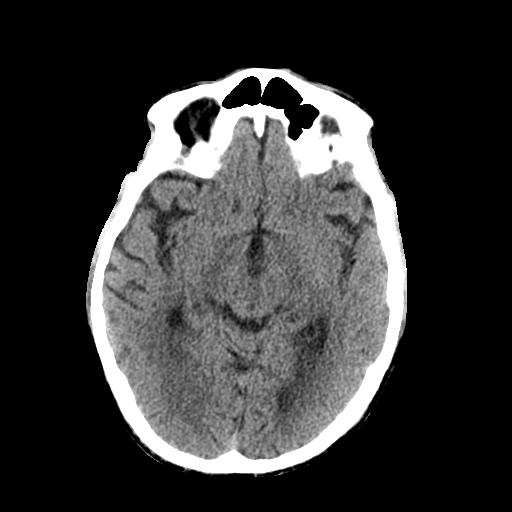
[im 17/29  brain]
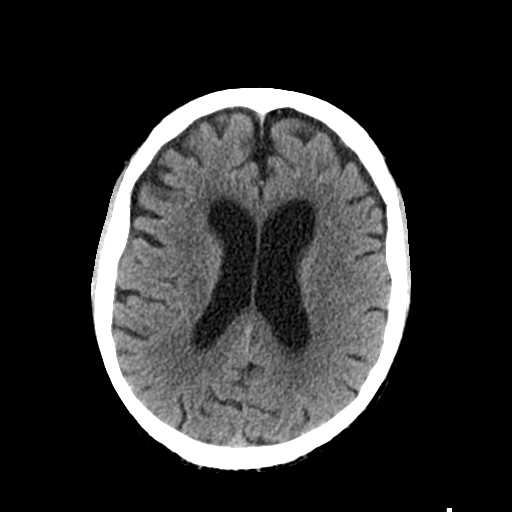
[im 23/29  brain]
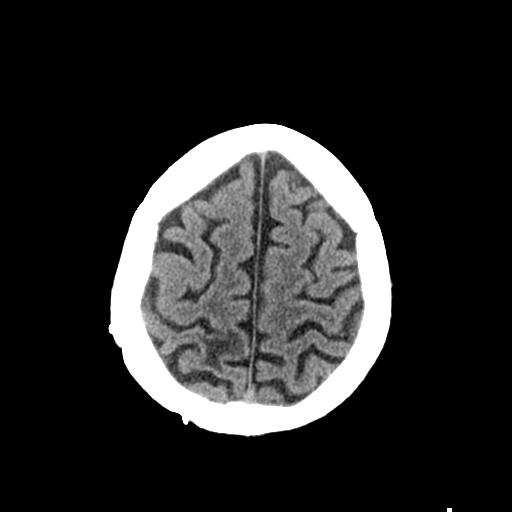

[Series 4: head wo · axial · 0.33mm/px · z∈[-75,+13]mm · 5 of 29 slices shown (2 of 2)]
[im 5/29  brain]
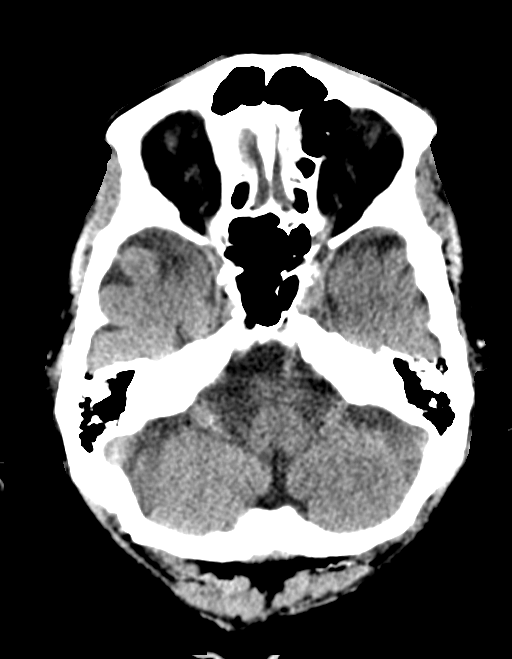
[im 10/29  brain]
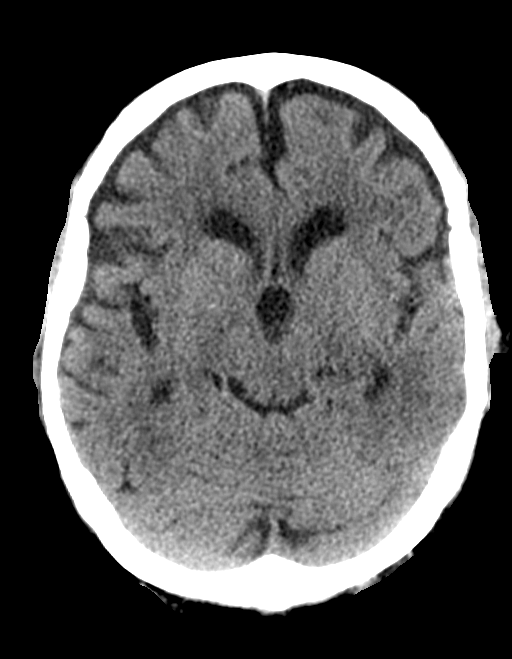
[im 15/29  brain]
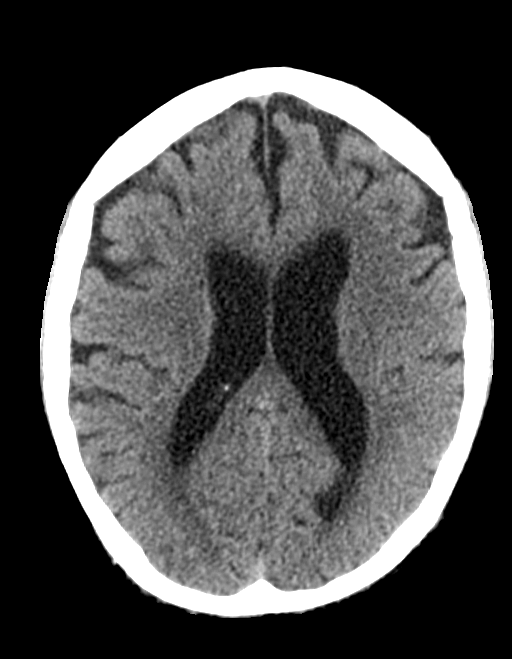
[im 19/29  brain]
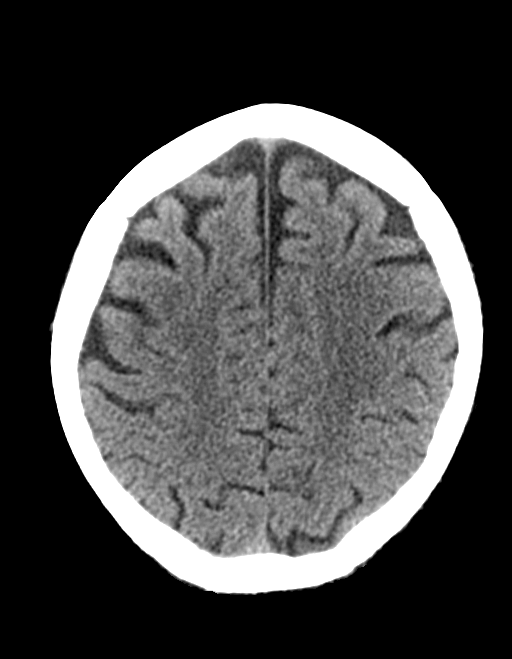
[im 24/29  brain]
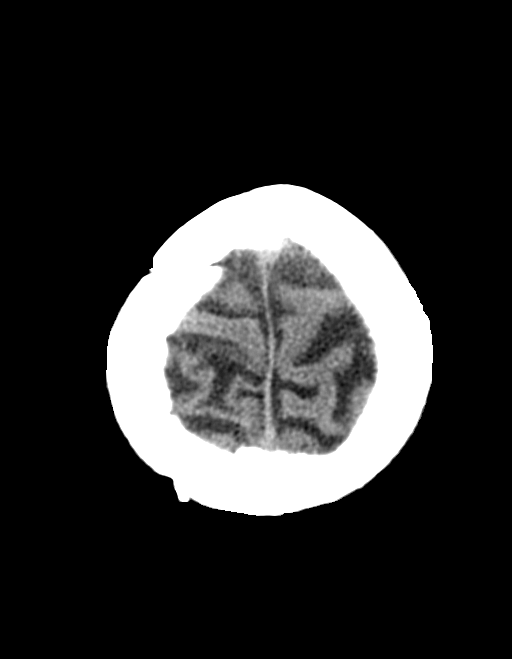

[Series 6: coronal soft tissue · coronal · 0.28mm/px · 3 of 72 slices shown]
[im 24/72  brain]
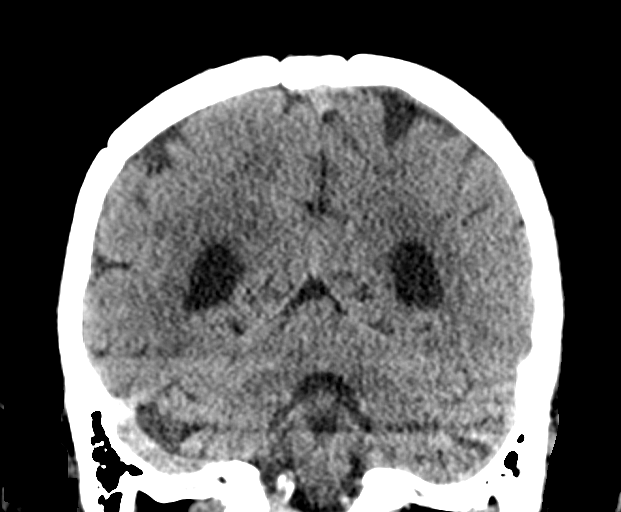
[im 32/72  brain]
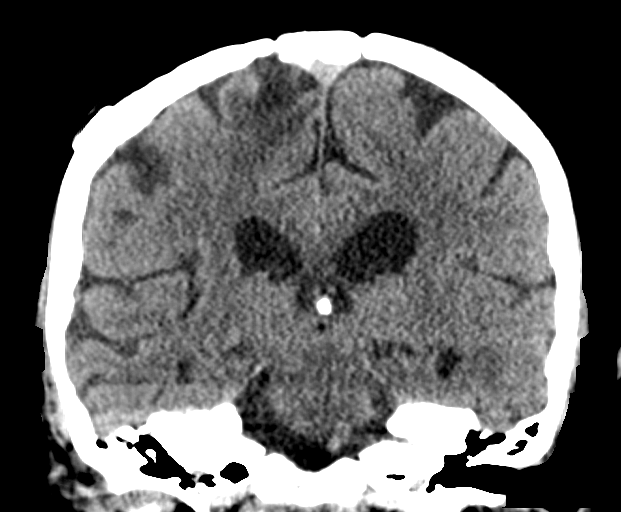
[im 40/72  brain]
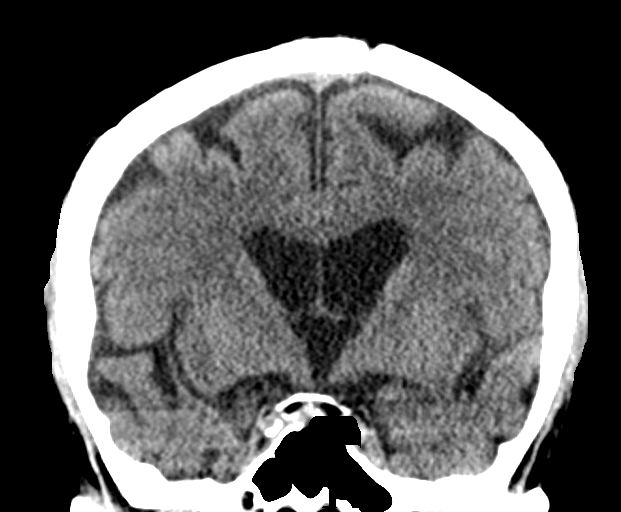

[Series 7: sagittal soft tissue · sagittal · 0.28mm/px · 3 of 55 slices shown]
[im 19/55  brain]
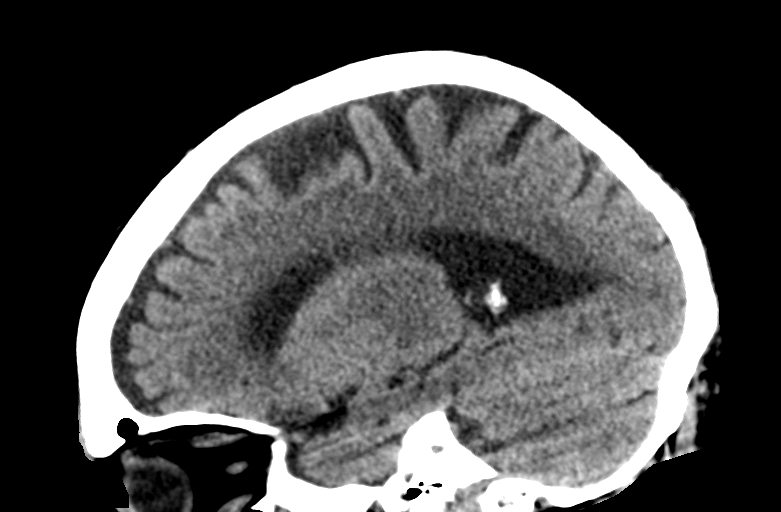
[im 28/55  brain]
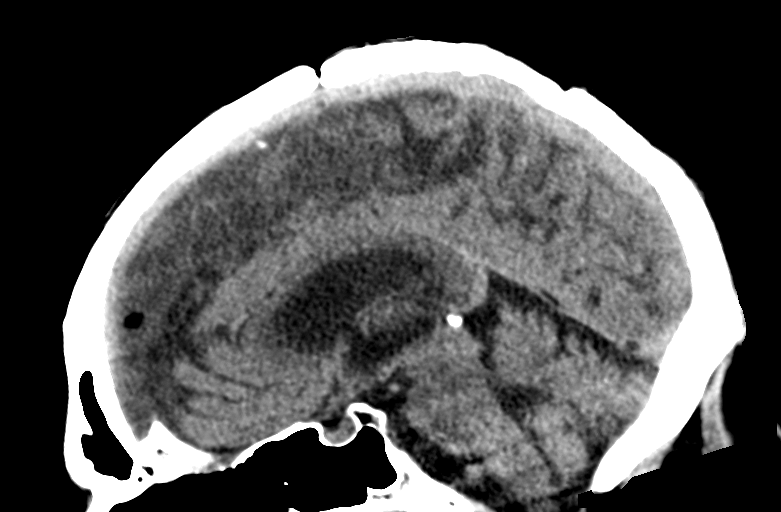
[im 37/55  brain]
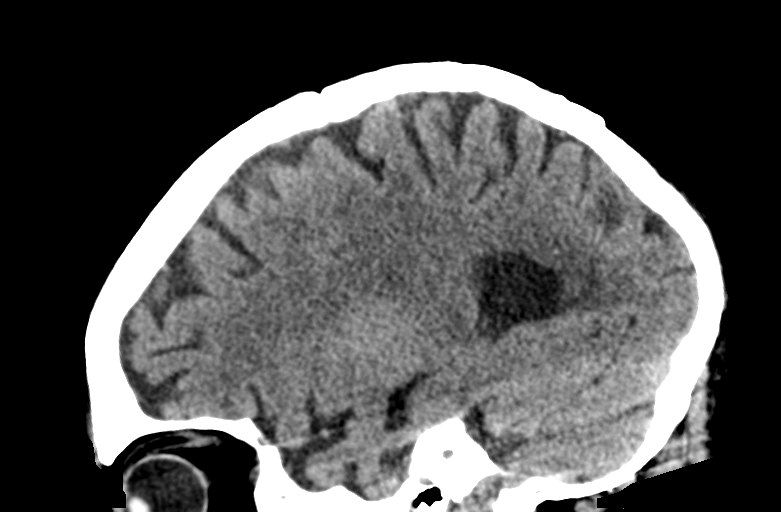

[15 of 47 positions shown; findings below may reference images not displayed]

FINDINGS: Brain: Generalized atrophy. Normal ventricular morphology. No
midline shift or mass effect. Old RIGHT parietal infarct at vertex.
No intracranial hemorrhage, mass lesion or evidence of acute
infarction. No extra-axial fluid collections.

Vascular: Atherosclerotic calcification of internal carotid and
vertebral arteries at skull base

Skull: Prior posterior RIGHT parietal craniotomy. Otherwise
unremarkable.

Sinuses/Orbits: Tiny mucosal retention cysts within RIGHT maxillary
sinus. Remaining sinuses unremarkable.

Other: N/A
IMPRESSION: Prior posterior RIGHT parietal craniotomy and old infarct.

No acute intracranial abnormalities.
